# Patient Record
Sex: Male | Born: 1935 | Race: White | Hispanic: No | Marital: Married | Smoking: Former smoker
Health system: Southern US, Community
[De-identification: ages and names within clinical notes are randomized; demographics above are authoritative.]

## PROBLEM LIST (undated history)

## (undated) DIAGNOSIS — I82401 Acute embolism and thrombosis of unspecified deep veins of right lower extremity: Secondary | ICD-10-CM

## (undated) DIAGNOSIS — I5189 Other ill-defined heart diseases: Secondary | ICD-10-CM

## (undated) DIAGNOSIS — I2699 Other pulmonary embolism without acute cor pulmonale: Secondary | ICD-10-CM

## (undated) DIAGNOSIS — J449 Chronic obstructive pulmonary disease, unspecified: Secondary | ICD-10-CM

## (undated) DIAGNOSIS — I471 Supraventricular tachycardia, unspecified: Secondary | ICD-10-CM

## (undated) DIAGNOSIS — K219 Gastro-esophageal reflux disease without esophagitis: Secondary | ICD-10-CM

## (undated) DIAGNOSIS — U071 COVID-19: Secondary | ICD-10-CM

## (undated) HISTORY — DX: Other ill-defined heart diseases: I51.89

## (undated) HISTORY — DX: Other pulmonary embolism without acute cor pulmonale: I26.99

## (undated) HISTORY — DX: Acute embolism and thrombosis of unspecified deep veins of right lower extremity: I82.401

## (undated) HISTORY — PX: EYE SURGERY: SHX253

## (undated) HISTORY — PX: APPENDECTOMY: SHX54

## (undated) HISTORY — PX: CHOLECYSTECTOMY: SHX55

---

## 2004-07-25 ENCOUNTER — Ambulatory Visit (HOSPITAL_COMMUNITY): Admission: RE | Admit: 2004-07-25 | Discharge: 2004-07-25 | Payer: Self-pay | Admitting: Otolaryngology

## 2004-07-25 ENCOUNTER — Ambulatory Visit (HOSPITAL_BASED_OUTPATIENT_CLINIC_OR_DEPARTMENT_OTHER): Admission: RE | Admit: 2004-07-25 | Discharge: 2004-07-25 | Payer: Self-pay | Admitting: Otolaryngology

## 2009-09-08 ENCOUNTER — Encounter (INDEPENDENT_AMBULATORY_CARE_PROVIDER_SITE_OTHER): Payer: Self-pay | Admitting: *Deleted

## 2013-11-03 ENCOUNTER — Encounter: Payer: Self-pay | Admitting: Internal Medicine

## 2014-04-10 ENCOUNTER — Encounter: Payer: Self-pay | Admitting: Internal Medicine

## 2021-09-13 ENCOUNTER — Ambulatory Visit: Payer: Medicare Other | Admitting: Dermatology

## 2021-09-13 ENCOUNTER — Other Ambulatory Visit: Payer: Self-pay

## 2021-09-13 DIAGNOSIS — L821 Other seborrheic keratosis: Secondary | ICD-10-CM

## 2021-09-13 DIAGNOSIS — L720 Epidermal cyst: Secondary | ICD-10-CM

## 2021-09-13 DIAGNOSIS — L578 Other skin changes due to chronic exposure to nonionizing radiation: Secondary | ICD-10-CM | POA: Diagnosis not present

## 2021-09-13 DIAGNOSIS — L820 Inflamed seborrheic keratosis: Secondary | ICD-10-CM

## 2021-09-13 NOTE — Patient Instructions (Addendum)
If you have any questions or concerns for your doctor, please call our main line at [phone] and press option 4 to reach your doctor's medical assistant. If no one answers, please leave a voicemail as directed and we will return your call as soon as possible. Messages left after 4 pm will be answered the following business day.   You may also send us a message via MyChart. We typically respond to MyChart messages within 1-2 business days.  For prescription refills, please ask your pharmacy to contact our office. Our fax number is [phone].  If you have an urgent issue when the clinic is closed that cannot wait until the next business day, you can page your doctor at the number below.    Please note that while we do our best to be available for urgent issues outside of office hours, we are not available 24/7.   If you have an urgent issue and are unable to reach us, you may choose to seek medical care at your doctor's office, retail clinic, urgent care center, or emergency room.  If you have a medical emergency, please immediately call 911 or go to the emergency department.  Pager Numbers  - Dr. Kowalski: [phone]  - Dr. Moye: [phone]  - Dr. Stewart: [phone]  In the event of inclement weather, please call our main line at [phone] for an update on the status of any delays or closures.  Dermatology Medication Tips: Please keep the boxes that topical medications come in in order to help keep track of the instructions about where and how to use these. Pharmacies typically print the medication instructions only on the boxes and not directly on the medication tubes.   If your medication is too expensive, please contact our office at [phone] option 4 or send us a message through MyChart.   We are unable to tell what your co-pay for medications will be in advance as this is different depending on your insurance coverage. However, we may be able to find a substitute  medication at lower cost or fill out paperwork to get insurance to cover a needed medication.   If a prior authorization is required to get your medication covered by your insurance company, please allow us 1-2 business days to complete this process.  Drug prices often vary depending on where the prescription is filled and some pharmacies may offer cheaper prices.  The website www.goodrx.com contains coupons for medications through different pharmacies. The prices here do not account for what the cost may be with help from insurance (it may be cheaper with your insurance), but the website can give you the price if you did not use any insurance.  - You can print the associated coupon and take it with your prescription to the pharmacy.  - You may also stop by our office during regular business hours and pick up a GoodRx coupon card.  - If you need your prescription sent electronically to a different pharmacy, notify our office through [facility] MyChart or by phone at [phone] option 4.   Wound Care Instructions  Cleanse wound gently with soap and water once a day then pat dry with clean gauze. Apply a thing coat of Petrolatum (petroleum jelly, "Vaseline") over the wound (unless you have an allergy to this). We recommend that you use a new, sterile tube of Vaseline. Do not pick or remove scabs. Do not remove the yellow or white "healing tissue" from the base of the wound.  Cover the   wound with fresh, clean, nonstick gauze and secure with paper tape. You may use Band-Aids in place of gauze and tape if the would is small enough, but would recommend trimming much of the tape off as there is often too much. Sometimes Band-Aids can irritate the skin.  You should call the office for your biopsy report after 1 week if you have not already been contacted.  If you experience any problems, such as abnormal amounts of bleeding, swelling, significant bruising, significant pain, or evidence of infection,  please call the office immediately.  FOR ADULT SURGERY PATIENTS: If you need something for pain relief you may take 1 extra strength Tylenol (acetaminophen) AND 2 Ibuprofen (200mg each) together every 4 hours as needed for pain. (do not take these if you are allergic to them or if you have a reason you should not take them.) Typically, you may only need pain medication for 1 to 3 days.    

## 2021-09-13 NOTE — Progress Notes (Signed)
New Patient Visit  Subjective  Dylan Weber is a 87 y.o. male who presents for the following: check spots (Scalp, face, ~81yr, no hx of skin ca).  The following portions of the chart were reviewed this encounter and updated as appropriate:   Allergies  Meds  Problems  Med Hx  Surg Hx  Fam Hx     Review of Systems:  No other skin or systemic complaints except as noted in HPI or Assessment and Plan.  Objective  Well appearing patient in no apparent distress; mood and affect are within normal limits.  A focused examination was performed including face, scalp. Relevant physical exam findings are noted in the Assessment and Plan.  R temple x 1, R forearm x 2, R crown scalp x 1, R neck x 2, Total = 6 (6) Erythematous keratotic or waxy stuck-on papule or plaque.   L temple Cystic pap  L lat canthus area White pap   Assessment & Plan   Seborrheic Keratoses - Stuck-on, waxy, tan-brown papules and/or plaques  - Benign-appearing - Discussed benign etiology and prognosis. - Observe - Call for any changes  Inflamed seborrheic keratosis R temple x 1, R forearm x 2, R crown scalp x 1, R neck x 2, Total = 6  Destruction of lesion - R temple x 1, R forearm x 2, R crown scalp x 1, R neck x 2, Total = 6 Complexity: simple   Destruction method: cryotherapy   Informed consent: discussed and consent obtained   Timeout:  patient name, date of birth, surgical site, and procedure verified Lesion destroyed using liquid nitrogen: Yes   Region frozen until ice ball extended beyond lesion: Yes   Outcome: patient tolerated procedure well with no complications   Post-procedure details: wound care instructions given    Epidermal cyst L temple Skin excision - L temple  Lesion length (cm):  0.6 Lesion width (cm):  0.6 Margin per side (cm):  0 Total excision diameter (cm):  0.6 Informed consent: discussed and consent obtained   Timeout: patient name, date of birth, surgical site, and  procedure verified   Procedure prep:  Patient was prepped and draped in usual sterile fashion Prep type:  Isopropyl alcohol and povidone-iodine Anesthesia: the lesion was anesthetized in a standard fashion   Anesthetic:  1% lidocaine w/ epinephrine 1-100,000 buffered w/ 8.4% NaHCO3 Instrument used: #11 blade   Hemostasis achieved with: pressure and aluminum chloride   Hemostasis achieved with comment:  Electrocautery Outcome: patient tolerated procedure well with no complications   Post-procedure details: sterile dressing applied and wound care instructions given   Dressing type: bandage, pressure dressing and bacitracin (Mupirocin)    Milia L lat canthus area Benign Chronic and persistent Start Aklief cr qhs sample x 1 Lot [id number] exp 01/25  Actinic Damage - chronic, secondary to cumulative UV radiation exposure/sun exposure over time - diffuse scaly erythematous macules with underlying dyspigmentation - Recommend daily broad spectrum sunscreen SPF 30+ to sun-exposed areas, reapply every 2 hours as needed.  - Recommend staying in the shade or wearing long sleeves, sun glasses (UVA+UVB protection) and wide brim hats (4-inch brim around the entire circumference of the hat). - Call for new or changing lesions.  Return if symptoms worsen or fail to improve.  I, Othelia Pulling, RMA, am acting as scribe for Sarina Ser, MD . Documentation: I have reviewed the above documentation for accuracy and completeness, and I agree with the above.  Sarina Ser, MD

## 2021-09-14 ENCOUNTER — Encounter: Payer: Self-pay | Admitting: Dermatology

## 2021-10-12 DIAGNOSIS — U071 COVID-19: Secondary | ICD-10-CM

## 2021-10-12 HISTORY — DX: COVID-19: U07.1

## 2021-10-31 ENCOUNTER — Observation Stay
Admission: EM | Admit: 2021-10-31 | Discharge: 2021-11-01 | Disposition: A | Discharge status: Home / Self Care | Payer: Medicare Other | Attending: Internal Medicine | Admitting: Internal Medicine

## 2021-10-31 ENCOUNTER — Encounter: Payer: Self-pay | Admitting: Emergency Medicine

## 2021-10-31 ENCOUNTER — Emergency Department: Payer: Medicare Other

## 2021-10-31 ENCOUNTER — Other Ambulatory Visit: Payer: Self-pay

## 2021-10-31 DIAGNOSIS — J9601 Acute respiratory failure with hypoxia: Secondary | ICD-10-CM | POA: Insufficient documentation

## 2021-10-31 DIAGNOSIS — R42 Dizziness and giddiness: Secondary | ICD-10-CM | POA: Diagnosis present

## 2021-10-31 DIAGNOSIS — I471 Supraventricular tachycardia: Secondary | ICD-10-CM | POA: Insufficient documentation

## 2021-10-31 DIAGNOSIS — J441 Chronic obstructive pulmonary disease with (acute) exacerbation: Secondary | ICD-10-CM | POA: Diagnosis not present

## 2021-10-31 DIAGNOSIS — R0902 Hypoxemia: Secondary | ICD-10-CM | POA: Diagnosis present

## 2021-10-31 DIAGNOSIS — U071 COVID-19: Principal | ICD-10-CM

## 2021-10-31 DIAGNOSIS — K219 Gastro-esophageal reflux disease without esophagitis: Secondary | ICD-10-CM | POA: Diagnosis not present

## 2021-10-31 LAB — CBC WITH DIFFERENTIAL/PLATELET
Abs Immature Granulocytes: 0.96 10*3/uL — ABNORMAL HIGH (ref 0.00–0.07)
Basophils Absolute: 0.1 10*3/uL (ref 0.0–0.1)
Basophils Relative: 1 %
Eosinophils Absolute: 0 10*3/uL (ref 0.0–0.5)
Eosinophils Relative: 0 %
HCT: 45.9 % (ref 39.0–52.0)
Hemoglobin: 15.4 g/dL (ref 13.0–17.0)
Immature Granulocytes: 10 %
Lymphocytes Relative: 21 %
Lymphs Abs: 2 10*3/uL (ref 0.7–4.0)
MCH: 31.2 pg (ref 26.0–34.0)
MCHC: 33.6 g/dL (ref 30.0–36.0)
MCV: 92.9 fL (ref 80.0–100.0)
Monocytes Absolute: 1.7 10*3/uL — ABNORMAL HIGH (ref 0.1–1.0)
Monocytes Relative: 18 %
Neutro Abs: 4.8 10*3/uL (ref 1.7–7.7)
Neutrophils Relative %: 50 %
Platelets: 207 10*3/uL (ref 150–400)
RBC: 4.94 MIL/uL (ref 4.22–5.81)
RDW: 13.8 % (ref 11.5–15.5)
Smear Review: NORMAL
WBC: 9.7 10*3/uL (ref 4.0–10.5)
nRBC: 0 % (ref 0.0–0.2)

## 2021-10-31 LAB — TROPONIN I (HIGH SENSITIVITY)
Troponin I (High Sensitivity): 10 ng/L (ref <18)
Troponin I (High Sensitivity): 10 ng/L (ref <18)

## 2021-10-31 LAB — COMPREHENSIVE METABOLIC PANEL
ALT: 20 U/L (ref 0–44)
AST: 25 U/L (ref 15–41)
Albumin: 4 g/dL (ref 3.5–5.0)
Alkaline Phosphatase: 55 U/L (ref 38–126)
Anion gap: 7 (ref 5–15)
BUN: 24 mg/dL — ABNORMAL HIGH (ref 8–23)
CO2: 28 mmol/L (ref 22–32)
Calcium: 8.9 mg/dL (ref 8.9–10.3)
Chloride: 100 mmol/L (ref 98–111)
Creatinine, Ser: 0.97 mg/dL (ref 0.61–1.24)
GFR, Estimated: >60 mL/min (ref >60)
Glucose, Bld: 109 mg/dL — ABNORMAL HIGH (ref 70–99)
Potassium: 3.9 mmol/L (ref 3.5–5.1)
Sodium: 135 mmol/L (ref 135–145)
Total Bilirubin: 0.6 mg/dL (ref 0.3–1.2)
Total Protein: 7.6 g/dL (ref 6.5–8.1)

## 2021-10-31 LAB — RESP PANEL BY RT-PCR (FLU A&B, COVID) ARPGX2
Influenza A by PCR: NEGATIVE
Influenza B by PCR: NEGATIVE
SARS Coronavirus 2 by RT PCR: POSITIVE — AB

## 2021-10-31 LAB — D-DIMER, QUANTITATIVE: D-Dimer, Quant: 0.46 ug/mL-FEU (ref 0.00–0.50)

## 2021-10-31 MED ORDER — ASCORBIC ACID 500 MG PO TABS
500.0000 mg | ORAL_TABLET | Freq: Every day | ORAL | Status: DC
  Administered 2021-10-31 – 2021-11-01 (×2): 500 mg via ORAL
  Filled 2021-10-31 (×2): qty 1

## 2021-10-31 MED ORDER — PANTOPRAZOLE SODIUM 40 MG PO TBEC
40.0000 mg | DELAYED_RELEASE_TABLET | Freq: Every day | ORAL | Status: DC
  Administered 2021-11-01: 10:00:00 40 mg via ORAL
  Filled 2021-10-31: qty 1

## 2021-10-31 MED ORDER — IPRATROPIUM-ALBUTEROL 20-100 MCG/ACT IN AERS
1.0000 | INHALATION_SPRAY | Freq: Four times a day (QID) | RESPIRATORY_TRACT | Status: DC
  Administered 2021-11-01 (×3): 1 via RESPIRATORY_TRACT
  Filled 2021-10-31: qty 4

## 2021-10-31 MED ORDER — HYDROCOD POLST-CPM POLST ER 10-8 MG/5ML PO SUER: 5.0000 mL | Freq: Two times a day (BID) | ORAL | Status: DC | PRN

## 2021-10-31 MED ORDER — ZINC SULFATE 220 (50 ZN) MG PO CAPS
220.0000 mg | ORAL_CAPSULE | Freq: Every day | ORAL | Status: DC
  Administered 2021-10-31 – 2021-11-01 (×2): 220 mg via ORAL
  Filled 2021-10-31 (×2): qty 1

## 2021-10-31 MED ORDER — FLUTICASONE FUROATE-VILANTEROL 200-25 MCG/ACT IN AEPB
1.0000 | INHALATION_SPRAY | Freq: Every day | RESPIRATORY_TRACT | Status: DC
  Administered 2021-11-01: 12:00:00 1 via RESPIRATORY_TRACT
  Filled 2021-10-31 (×2): qty 28

## 2021-10-31 MED ORDER — ADULT MULTIVITAMIN W/MINERALS CH
1.0000 | ORAL_TABLET | Freq: Every day | ORAL | Status: DC
  Administered 2021-11-01: 10:00:00 1 via ORAL
  Filled 2021-10-31: qty 1

## 2021-10-31 MED ORDER — GUAIFENESIN-DM 100-10 MG/5ML PO SYRP: 10.0000 mL | ORAL_SOLUTION | ORAL | Status: DC | PRN

## 2021-10-31 MED ORDER — SODIUM CHLORIDE 0.9 % IV BOLUS
1000.0000 mL | Freq: Once | INTRAVENOUS | Status: AC
  Administered 2021-10-31: 17:00:00 1000 mL via INTRAVENOUS

## 2021-10-31 MED ORDER — ENOXAPARIN SODIUM 40 MG/0.4ML IJ SOSY
40.0000 mg | PREFILLED_SYRINGE | INTRAMUSCULAR | Status: DC
  Administered 2021-11-01: 40 mg via SUBCUTANEOUS
  Filled 2021-10-31: qty 0.4

## 2021-10-31 MED ORDER — METOPROLOL TARTRATE 5 MG/5ML IV SOLN: 5.0000 mg | INTRAVENOUS | Status: DC | PRN

## 2021-10-31 NOTE — ED Triage Notes (Signed)
Pt brought in by POV with c/o feeling like he was going to pass out. Pts O2 on room air was 84% and was placed on 3L . He does not usually wear O2.

## 2021-10-31 NOTE — H&P (Signed)
H&P:    Dylan Weber   AUQ:[id number] DOB: 25-Sep-1933 DOA: 10/31/2021  PCP: [facility]  Chief Complaint: Generalized weakness, fatigue and several episodes of near syncope   History of Present Illness:    HPI: Dylan Weber is a 87 y.o. male with a past medical history of COPD not on chronic oxygen supplementation, GERD, former tobacco dependence.  This patient presents to the emergency department with generalized weakness, fatigue and several episodes of near syncope over the last 48 hours.  Was diagnosed with COVID-19 on December 13.  Completed 5-day course of Paxlovid and prednisone.  He endorses an occasional cough with purulent sputum but this is now essentially resolved.  No fevers or chills.  No urinary complaints.  No nausea vomiting or diarrhea.  No abdominal pain.  No wheezing  In the emergency department he is found to be hypoxic with 85% oxygen saturation on room air.  Currently on 4 L and saturating well.  Not on chronic oxygen.  Labs are fairly unremarkable.  He did have 1 episode of A. fib RVR with his near syncope episode that self resolved.  Heart rate was 141.  Wife is present at bedside.  He is not encephalopathic.  He denies any calf tenderness bilaterally.  He has received both COVID-19 vaccines and 3 booster vaccines.  Last booster shot was 3 weeks ago.  Has also received a flu vaccine this year.  ED Course: Chest x-ray showed no acute findings.  Troponins are unremarkable.  Labs are unremarkable as well.     ROS:   14 point review of systems is negative except for what is mentioned above in the HPI.   Past Medical History:   History reviewed. No pertinent past medical history.  Past Surgical History:   History reviewed. No pertinent surgical history.  Social History:   Social History   Socioeconomic History   Marital status: Married    Spouse name: Not on file   Number of children: Not on file   Years of education: Not on file    Highest education level: Not on file  Occupational History   Not on file  Tobacco Use   Smoking status: Not on file   Smokeless tobacco: Not on file  Substance and Sexual Activity   Alcohol use: Not on file   Drug use: Not on file   Sexual activity: Not on file  Other Topics Concern   Not on file  Social History Narrative   Not on file   Social Determinants of Health   Financial Resource Strain: Not on file  Food Insecurity: Not on file  Transportation Needs: Not on file  Physical Activity: Not on file  Stress: Not on file  Social Connections: Not on file  Intimate Partner Violence: Not on file    Allergies:   No Known Allergies  Family History:   No family history on file.   Current Medications:   Prior to Admission medications   Medication Sig Start Date End Date Taking? Authorizing Provider  acetaminophen (TYLENOL 8 HOUR ARTHRITIS PAIN) 650 MG CR tablet Take 650 mg by mouth as needed for pain.   Yes [provider]  BREO ELLIPTA 200-25 MCG/ACT AEPB Inhale 1 puff into the lungs daily. 10/12/21  Yes [provider]  guaiFENesin (MUCINEX) 600 MG 12 hr tablet Take 600 mg by mouth daily.   Yes [provider]  Multiple Vitamin (MULTIVITAMIN) capsule Take 1 capsule by mouth daily.  Yes [provider]  pantoprazole (PROTONIX) 40 MG tablet Take 40 mg by mouth daily. 09/20/21  Yes [provider]  pseudoephedrine-acetaminophen (TYLENOL SINUS) 30-500 MG TABS tablet Take 1 tablet by mouth as needed.   Yes [provider]     Physical Exam:   Vitals:   10/31/21 1544 10/31/21 1630 10/31/21 1700 10/31/21 1715  BP:  (!) 135/120 115/68   Pulse:   81 (!) 114  Resp:    17  Temp:      TempSrc:      SpO2:   (!) 87% (!) 85%  Weight: 78.5 kg     Height: 5\' 9"  (1.753 m)        General:  Appears calm and comfortable and is in NAD Cardiovascular:  RRR, no m/r/g.  Respiratory:   CTA bilaterally with no  wheezes/rales/rhonchi.  Normal respiratory effort. Abdomen:  soft, NT, ND, NABS Skin:  no rash or induration seen on limited exam Musculoskeletal:  grossly normal tone BUE/BLE, good ROM, no bony abnormality Lower extremity:  No LE edema.  Limited foot exam with no ulcerations.  2+ distal pulses. Psychiatric:  grossly normal mood and affect, speech fluent and appropriate, AOx3 Neurologic:  CN 2-12 grossly intact, moves all extremities in coordinated fashion, sensation intact    Data Review:    Radiological Exams on Admission: Independently reviewed - see discussion in A/P where applicable  DG Chest 2 View  Result Date: 10/31/2021 CLINICAL DATA:  Syncope EXAM: CHEST - 2 VIEW COMPARISON:  None. FINDINGS: The heart size and mediastinal contours are within normal limits. Shallow inspiration with low lung volumes. Both lungs are clear. No pleural effusion or pneumothorax. The visualized skeletal structures are unremarkable. IMPRESSION: No active cardiopulmonary disease. Electronically Signed   By: Macy Mis M.D.   On: 10/31/2021 16:18    EKG: Independently reviewed.  A. fib RVR, rate 141, repeat EKG sinus rhythm with rate 96 with occasional PVCs.   Labs on Admission: I have personally reviewed the available labs and imaging studies at the time of the admission.  Pertinent labs on Admission: BUN 24     Assessment/Plan:    Acute hypoxic respiratory failure: Secondary to COVID-19 infection.  No evidence of superimposed bacterial pneumonia on chest x-ray.  Will obtain D-dimer and if elevated obtain CTA to rule out pulmonary embolism.  He completed a 5-day course of Paxlovid and prednisone.  Supportive care with antitussives.  Flutter valve and ICS.  Start scheduled Combivent inhaler.  Wean oxygen as tolerated.  Eager to go home.  Consider home oxygen screening in the morning and discharge with home oxygen supplementation for recovery at home.  Contact and airborne  precautions.  Paroxysmal atrial fibrillation: Rapid ventricular response.  Self resolved.  Transient in the context of COVID-19 infection.  Consult cardiology.  Defer to cardiology for starting chronic anticoagulation.  Obtain echocardiogram.  Lopressor IV as needed for heart rate greater than 130. Continue telemetry.  COPD: Continue home Breo Ellipta inhaler.  Not in acute exacerbation.  Clear lungs on exam.  GERD: Continue home Protonix    Other information:    Level of Care: Med telemetry DVT prophylaxis: Lovenox subcu Code Status: Full code Consults: Cardiology Admission status: Inpatient   Leslee Home DO [facility]   How to contact the [facility] Attending or Consulting provider [place] or covering provider during after hours [place], for this patient?  Check the care team in [facility] and look for a)  attending/consulting [facility] provider listed and b) the [facility] team listed Log into [email or web address] and use [place]'s universal password to access. If you do not have the password, please contact the hospital operator. Locate the [facility] provider you are looking for under [facility] and page to a number that you can be directly reached. If you still have difficulty reaching the provider, please page the [facility] (Director on Call) for the Hospitalists listed on amion for assistance.   10/31/2021, 6:25 PM

## 2021-10-31 NOTE — ED Provider Notes (Signed)
°  Emergency Medicine Provider Triage Evaluation Note  Dylan Weber , a 87 y.o.male,  was evaluated in triage.  Pt complains of near syncope.  Patient states that he has been experiencing several episodes of near fainting over the past 48 hours.  Patient states that he feels a warm rush to his body and starts to feel his vision darkening.  These episodes self resolved.  During our initial EKG, he began experiencing these episodes.  EKG shows A. fib with RVR with a rate of 147.  The following EKG after the episode shows sinus rhythm with a rate of 96.  Denies chest pain, shortness of breath, back pain, or urinary symptoms.   Review of Systems  Positive: Near syncope Negative: Denies fever, chest pain, vomiting  Physical Exam   Vitals:   10/31/21 1530  BP: 104/89  Pulse: 80  Resp: 20  Temp: 98 F (36.7 C)  SpO2: 91%   Gen:   Awake, no distress   Resp:  Normal effort  MSK:   Moves extremities without difficulty  Other:    Medical Decision Making  Given the patient's initial medical screening exam, the following diagnostic evaluation has been ordered. The patient will be placed in the appropriate treatment space, once one is available, to complete the evaluation and treatment. I have discussed the plan of care with the patient and I have advised the patient that an ED physician or mid-level practitioner will reevaluate their condition after the test results have been received, as the results may give them additional insight into the type of treatment they may need.    Diagnostics: Labs, EKG, CXR  Treatments: none immediately   Teodoro Spray, PA 10/31/21 1544    Lucrezia Starch, MD 10/31/21 2049

## 2021-10-31 NOTE — ED Provider Notes (Signed)
[facility] Emergency Department Provider Note  Time seen: 4:18 PM  I have reviewed the triage vital signs and the nursing notes.   HISTORY  Chief Complaint Near Syncope   HPI Dylan Weber is a 87 y.o. male who was diagnosed with COVID last week presents to the emergency department for several episodes of near syncope.  According to the patient over the past 48 hours he has been feeling very lightheaded at times and has had several episodes of almost passing out but states he has not actually passed out.  Here patient is awake alert, no distress.  Denies any shortness of breath however currently satting 84% on room air placed on 3 L nasal cannula.  No home/baseline O2 requirement.  Patient was diagnosed with COVID last week finished his antivirals on Monday.   History reviewed. No pertinent past medical history.  There are no problems to display for this patient.   History reviewed. No pertinent surgical history.  Prior to Admission medications   Not on File    No Known Allergies  No family history on file.  Social History    Review of Systems Constitutional: Negative for fever. Cardiovascular: Negative for chest pain. Respiratory: Intermittent shortness of breath none currently. Gastrointestinal: Negative for abdominal pain Musculoskeletal: Negative for musculoskeletal complaints Neurological: Negative for headache All other ROS negative  ____________________________________________   PHYSICAL EXAM:  VITAL SIGNS: ED Triage Vitals  Enc Vitals Group     BP 10/31/21 1530 104/89     Pulse Rate 10/31/21 1530 80     Resp 10/31/21 1530 20     Temp 10/31/21 1530 98 F (36.7 C)     Temp Source 10/31/21 1530 Oral     SpO2 10/31/21 1530 91 %     Weight 10/31/21 1544 173 lb (78.5 kg)     Height 10/31/21 1544 5\' 9"  (1.753 m)     Head Circumference --      Peak Flow --      Pain Score 10/31/21 1544 0     Pain Loc --      Pain Edu? --       Excl. in [place]? --    Constitutional: Alert and oriented. Well appearing and in no distress. Eyes: Normal exam ENT      Head: Normocephalic and atraumatic.      Mouth/Throat: Mucous membranes are moist. Cardiovascular: Normal rate, regular rhythm.  Respiratory: Normal respiratory effort without tachypnea nor retractions. Breath sounds are clear, no wheeze rales or rhonchi. Gastrointestinal: Soft and nontender. No distention.   Musculoskeletal: Nontender with normal range of motion in all extremities.  Neurologic:  Normal speech and language. No gross focal neurologic deficits Skin:  Skin is warm, dry and intact.  Psychiatric: Mood and affect are normal.  ____________________________________________    EKG  EKG viewed and interpreted by myself shows a normal sinus rhythm at 96 bpm with a narrow QRS, normal axis, normal intervals, nonspecific ST changes.  ____________________________________________    RADIOLOGY  Chest x-ray is negative  ____________________________________________   INITIAL IMPRESSION / ASSESSMENT AND PLAN / ED COURSE  Pertinent labs & imaging results that were available during my care of the patient were reviewed by me and considered in my medical decision making (see chart for details).   Patient presents emergency department for near syncope and generalized weakness at home.  Per patient was diagnosed with COVID last week, finishes antivirals.  Patient presents with weakness/lightheadedness currently satting 84% on room  air with no baseline O2 requirement.  We will check labs, chest x-ray, troponin and continue to closely monitor.  Patient is on 3 L satting in the mid upper 90s.  Lab work is largely nonrevealing including a normal troponin.  Patient has dropped his saturation into the 80s on 2 L currently on 4 L of oxygen.  Highly suspect COVID-related hypoxia.  We will admit to the hospital service for ongoing supportive care and oxygen.  Patient agreeable to  plan of care.   Dylan Weber was evaluated in Emergency Department on 10/31/2021 for the symptoms described in the history of present illness. He was evaluated in the context of the global COVID-19 pandemic, which necessitated consideration that the patient might be at risk for infection with the SARS-CoV-2 virus that causes COVID-19. Institutional protocols and algorithms that pertain to the evaluation of patients at risk for COVID-19 are in a state of rapid change based on information released by regulatory bodies including the CDC and federal and state organizations. These policies and algorithms were followed during the patient's care in the ED.  CRITICAL CARE Performed by: Harvest Dark   Total critical care time: 30 minutes  Critical care time was exclusive of separately billable procedures and treating other patients.  Critical care was necessary to treat or prevent imminent or life-threatening deterioration.  Critical care was time spent personally by me on the following activities: development of treatment plan with patient and/or surrogate as well as nursing, discussions with consultants, evaluation of patient's response to treatment, examination of patient, obtaining history from patient or surrogate, ordering and performing treatments and interventions, ordering and review of laboratory studies, ordering and review of radiographic studies, pulse oximetry and re-evaluation of patient's condition.  ____________________________________________   FINAL CLINICAL IMPRESSION(S) / ED DIAGNOSES  Respiratory failure secondary to COVID-19   Harvest Dark, MD 10/31/21 1754

## 2021-11-01 ENCOUNTER — Telehealth: Payer: Self-pay | Admitting: Cardiovascular Disease

## 2021-11-01 ENCOUNTER — Inpatient Hospital Stay: Admit: 2021-11-01 | Payer: Medicare Other

## 2021-11-01 ENCOUNTER — Other Ambulatory Visit: Payer: Self-pay | Admitting: Nurse Practitioner

## 2021-11-01 DIAGNOSIS — R55 Syncope and collapse: Secondary | ICD-10-CM

## 2021-11-01 DIAGNOSIS — U071 COVID-19: Secondary | ICD-10-CM | POA: Diagnosis not present

## 2021-11-01 DIAGNOSIS — I471 Supraventricular tachycardia: Secondary | ICD-10-CM | POA: Diagnosis not present

## 2021-11-01 DIAGNOSIS — J441 Chronic obstructive pulmonary disease with (acute) exacerbation: Secondary | ICD-10-CM | POA: Insufficient documentation

## 2021-11-01 DIAGNOSIS — K219 Gastro-esophageal reflux disease without esophagitis: Secondary | ICD-10-CM | POA: Insufficient documentation

## 2021-11-01 DIAGNOSIS — J449 Chronic obstructive pulmonary disease, unspecified: Secondary | ICD-10-CM | POA: Insufficient documentation

## 2021-11-01 DIAGNOSIS — J9621 Acute and chronic respiratory failure with hypoxia: Secondary | ICD-10-CM

## 2021-11-01 DIAGNOSIS — J9601 Acute respiratory failure with hypoxia: Secondary | ICD-10-CM

## 2021-11-01 DIAGNOSIS — R0902 Hypoxemia: Secondary | ICD-10-CM | POA: Diagnosis present

## 2021-11-01 LAB — BLOOD GAS, ARTERIAL
Acid-Base Excess: 1.8 mmol/L (ref 0.0–2.0)
Bicarbonate: 24.4 mmol/L (ref 20.0–28.0)
FIO2: 0.21
O2 Saturation: 91.2 %
Patient temperature: 37
pCO2 arterial: 32 mmHg (ref 32.0–48.0)
pH, Arterial: 7.49 — ABNORMAL HIGH (ref 7.350–7.450)
pO2, Arterial: 56 mmHg — ABNORMAL LOW (ref 83.0–108.0)

## 2021-11-01 LAB — TSH: TSH: 1.028 u[IU]/mL (ref 0.350–4.500)

## 2021-11-01 MED ORDER — ZINC SULFATE 220 (50 ZN) MG PO CAPS: 220.0000 mg | ORAL_CAPSULE | Freq: Every day | ORAL | 0 refills | Status: DC

## 2021-11-01 MED ORDER — DILTIAZEM HCL ER COATED BEADS 120 MG PO CP24: 120.0000 mg | ORAL_CAPSULE | Freq: Every day | ORAL | 6 refills | Status: DC

## 2021-11-01 MED ORDER — DILTIAZEM HCL ER COATED BEADS 120 MG PO CP24
120.0000 mg | ORAL_CAPSULE | Freq: Every day | ORAL | Status: DC
  Administered 2021-11-01: 13:00:00 120 mg via ORAL
  Filled 2021-11-01: qty 1

## 2021-11-01 MED ORDER — PREDNISONE 10 MG PO TABS: ORAL_TABLET | ORAL | 0 refills | Status: DC

## 2021-11-01 MED ORDER — DIPHENHYDRAMINE HCL 25 MG PO CAPS
50.0000 mg | ORAL_CAPSULE | Freq: Once | ORAL | Status: AC
Start: 2021-11-01 — End: 2021-11-01
  Administered 2021-11-01: 50 mg via ORAL
  Filled 2021-11-01: qty 2

## 2021-11-01 MED ORDER — ASCORBIC ACID 500 MG PO TABS: 500.0000 mg | ORAL_TABLET | Freq: Every day | ORAL | 0 refills | Status: DC

## 2021-11-01 MED ORDER — ACETAMINOPHEN 325 MG PO TABS
650.0000 mg | ORAL_TABLET | Freq: Once | ORAL | Status: AC
  Administered 2021-11-01: 06:00:00 650 mg via ORAL
  Filled 2021-11-01: qty 2

## 2021-11-01 MED ORDER — METHYLPREDNISOLONE SODIUM SUCC 125 MG IJ SOLR
80.0000 mg | INTRAMUSCULAR | Status: DC
  Administered 2021-11-01: 08:00:00 80 mg via INTRAVENOUS
  Filled 2021-11-01: qty 2

## 2021-11-01 MED ORDER — SODIUM CHLORIDE 0.9 % IV SOLN
100.0000 mg | Freq: Every day | INTRAVENOUS | Status: DC
  Filled 2021-11-01: qty 20

## 2021-11-01 MED ORDER — SODIUM CHLORIDE 0.9 % IV SOLN
200.0000 mg | Freq: Once | INTRAVENOUS | Status: AC
  Administered 2021-11-01: 11:00:00 200 mg via INTRAVENOUS
  Filled 2021-11-01: qty 40

## 2021-11-01 MED ORDER — METHYLPREDNISOLONE SODIUM SUCC 40 MG IJ SOLR: 40.0000 mg | Freq: Two times a day (BID) | INTRAMUSCULAR | Status: DC

## 2021-11-01 MED ORDER — ALBUTEROL SULFATE HFA 108 (90 BASE) MCG/ACT IN AERS: 2.0000 | INHALATION_SPRAY | Freq: Four times a day (QID) | RESPIRATORY_TRACT | 0 refills | Status: DC | PRN

## 2021-11-01 NOTE — Progress Notes (Signed)
PHARMACIST - PHYSICIAN COMMUNICATION   CONCERNING: Methylprednisolone IV    Current order: Methylprednisolone IV 40mg  IV every 12 hours      DESCRIPTION: Per protocol [facility] Protocol:   IV methylprednisolone will be converted to either a q12h or q24h frequency with the same total daily dose (TDD).  Ordered Dose: 1 to 125 mg TDD; convert to: TDD q24h.  Ordered Dose: 126 to 250 mg TDD; convert to: TDD div q12h.  Ordered Dose: >250 mg TDD; DAW.  Order has been adjusted to: Methylprednisolone IV 80mg  IV every 24 hours   Pernell Dupre , PharmD, BCPS Clinical Pharmacist  11/01/2021 8:22 AM

## 2021-11-01 NOTE — Progress Notes (Signed)
SATURATION QUALIFICATIONS: (This note is used to comply with regulatory documentation for home oxygen)  Patient Saturations on Room Air at Rest = 92%  Patient Saturations on Room Air while Ambulating = 87%  Patient Saturations on 2 Liters of oxygen while Ambulating = 88-92%  Please briefly explain why patient needs home oxygen:  Desats on room air at rest at times and with exertion.

## 2021-11-01 NOTE — ED Notes (Signed)
While pt ambulating down the hallway, pt O2 saturation on RA was staying in the upper 70s-80s. When pt was sitting on the toilet, pt O2 saturation increased to 92%. Pt in bed with O2 saturation of 91-92% on RA. Baxter Flattery, RN aware.

## 2021-11-01 NOTE — Telephone Encounter (Addendum)
-----   Message from Theora Gianotti, NP sent at 11/01/2021 12:24 PM EST -----    Would you pls arrange for this gent to f/u with me or Gollan in ~ 3-4 wks?  Also, I'm placing an order for an echo - would you pls contact him to schedule?  He's got COVID, so ideally would wait at least 2 wks.  Thanks,  Gerald Stabs

## 2021-11-01 NOTE — Telephone Encounter (Signed)
Currently admitted ED will attempt another day .

## 2021-11-01 NOTE — TOC Initial Note (Signed)
Transition of Care [facility]) - Initial/Assessment Note    Patient Details  Name: Dylan Weber MRN: [id number] Date of Birth: 07-26-1933  Transition of Care [facility]) CM/SW Contact:    Shelbie Hutching, RN Phone Number: 11/01/2021, 3:59 PM  Clinical Narrative:                 Patient admitted to the hospital with COVID then changed to observation when condition resolved and patient discharged earlier than anticipated.  MOON code 47 completed with patient at the bedside.    Patient is from home with his wife.  He is independent and drives.  He is current with PCP at [facility].  He does need oxygen at home.  Oxygen ordered from [employer] and delivered to the room before he discharged home.   Wife picked patient up and transporting home.  Expected Discharge Plan: Home/Self Care Barriers to Discharge: Barriers Resolved   Patient Goals and CMS Choice Patient states their goals for this hospitalization and ongoing recovery are:: Glad to be going home today      Expected Discharge Plan and Services Expected Discharge Plan: Home/Self Care   Discharge Planning Services: CM Consult   Living arrangements for the past 2 months: Single Family Home Expected Discharge Date: 11/01/21               DME Arranged: Oxygen DME Agency: [employer] Date DME Agency Contacted: 11/01/21 Time DME Agency Contacted: 1250 Representative spoke with at DME Agency: [place]: NA [place] Agency: NA        Prior Living Arrangements/Services Living arrangements for the past 2 months: [place] with:: Spouse Patient language and need for interpreter reviewed:: Yes Do you feel safe going back to the place where you live?: Yes      Need for Family Participation in Patient Care: Yes (Comment) Care giver support system in place?: Yes (comment)   Criminal Activity/Legal Involvement Pertinent to Current Situation/Hospitalization: No - Comment as needed  Activities of Daily Living       Permission Sought/Granted Permission sought to share information with : Case Manager, Family Supports, Other (comment) Permission granted to share information with : Yes, Verbal Permission Granted     Permission granted to share info w AGENCY: [employer]  Permission granted to share info w Relationship: spouse     Emotional Assessment Appearance:: Appears stated age Attitude/Demeanor/Rapport: Engaged Affect (typically observed): Accepting Orientation: : Oriented to Self, Oriented to Place, Oriented to  Time, Oriented to Situation Alcohol / Substance Use: Not Applicable Psych Involvement: No (comment)  Admission diagnosis:  COVID-19 [U07.1] Hypoxia [R09.02] Patient Active Problem List   Diagnosis Date Noted   Hypoxia 11/01/2021   COVID-19 10/31/2021   PCP:  [place] [facility]:   [id number] - , [place] [address] [place] [address] Phone: [phone] Fax: [phone]     Social Determinants of Health (SDOH) Interventions    Readmission Risk Interventions No flowsheet data found.

## 2021-11-01 NOTE — Care Management CC44 (Signed)
Condition Code 44 Documentation Completed  Patient Details  Name: Dylan Weber MRN: [id number] Date of Birth: Sep 19, 1933   Condition Code 44 given:  Yes Patient signature on Condition Code 44 notice:  Yes Documentation of 2 MD's agreement:  Yes Code 44 added to claim:  Yes    Shelbie Hutching, RN 11/01/2021, 12:57 PM

## 2021-11-01 NOTE — ED Notes (Signed)
Pt ambulatory approximately 20 ft around rm on 2L O2. Pt O2 saturation stayed around 88-92% on 2L O2. Pt stated that he "felt fine for my age."

## 2021-11-01 NOTE — Discharge Instructions (Addendum)
Wears 2 Liters of oxygen 24/7

## 2021-11-01 NOTE — ED Notes (Signed)
Pt was given home O2 to go home with and set and use was explained to pt and wife with teach back completed.

## 2021-11-01 NOTE — Consult Note (Signed)
Cardiology Consultation:   Patient ID: Dylan Weber MRN: [id number]; DOB: 1933/06/09  Admit date: 10/31/2021 Date of Consult: 11/01/2021  PCP:  [facility] Cardiologist:  new to [facility] Physician requesting consult: Dr. Leslye Peer Reason for consult: Arrhythmia, shortness of breath, near syncope  Patient Profile:   Dylan Weber is a 87 y.o. male with a hx of smoking, COPD who is being seen 11/01/2021 for the evaluation of hypoxia, wide-complex tachycardia, near syncope  History of Present Illness:   Dylan Weber reports he has been in his usual state of health until several days ago, Has had increasing shortness of breath, near syncope episodes Describes these episodes as a near fainting experience, warm rash over his body, " vision goes dark" Has only had several of these episodes over the past 2 days On room air in triage 84%, placed on 3 L nasal cannula with improvement of his hypoxia Reports he uses inhalers at home both albuterol and steroid inhaler  In ER emergency room noted to have tachycardia rate up to 140 bpm, self terminating.  With these episodes had associated near syncope/lightheadedness  Reports having similar episodes approximately 15 years ago but does not have episodes on frequent basis  Note indicating diagnosed with COVID last week, finished antivirals approximately 2-3 days ago Finished Paxil elevated, prednisone Reports he had completed his vaccine boosters  On ambulation the emergency room saturations 70s to 80s on room air Into the low 90s on nasal cannula oxygen    History reviewed.  COPD, prior smoking history  History reviewed. No pertinent surgical history.   Home Medications:  Prior to Admission medications   Medication Sig Start Date End Date Taking? Authorizing Provider  acetaminophen (TYLENOL 8 HOUR ARTHRITIS PAIN) 650 MG CR tablet Take 650 mg by mouth as needed for pain.   Yes [provider]   albuterol (VENTOLIN HFA) 108 (90 Base) MCG/ACT inhaler Inhale 2 puffs into the lungs every 6 (six) hours as needed for wheezing or shortness of breath. 11/01/21  Yes Wieting, Richard, MD  BREO ELLIPTA 200-25 MCG/ACT AEPB Inhale 1 puff into the lungs daily. 10/12/21  Yes [provider]  guaiFENesin (MUCINEX) 600 MG 12 hr tablet Take 600 mg by mouth daily.   Yes [provider]  Multiple Vitamin (MULTIVITAMIN) capsule Take 1 capsule by mouth daily.   Yes [provider]  pantoprazole (PROTONIX) 40 MG tablet Take 40 mg by mouth daily. 09/20/21  Yes [provider]  predniSONE (DELTASONE) 10 MG tablet 4 tabs po day1,2; 3 tabs po day3,4; 2 tabs po day5,6; 1 tab po day 7,8; 1/2 tab po day9,10 11/01/21  Yes Wieting, Richard, MD  pseudoephedrine-acetaminophen (TYLENOL SINUS) 30-500 MG TABS tablet Take 1 tablet by mouth as needed.   Yes [provider]  ascorbic acid (VITAMIN C) 500 MG tablet Take 1 tablet (500 mg total) by mouth daily. 11/02/21   Loletha Grayer, MD  diltiazem (CARDIZEM CD) 120 MG 24 hr capsule Take 1 capsule (120 mg total) by mouth daily. 11/01/21   Theora Gianotti, NP  zinc sulfate 220 (50 Zn) MG capsule Take 1 capsule (220 mg total) by mouth daily. 11/02/21   Loletha Grayer, MD    Inpatient Medications: Scheduled Meds:  vitamin C  500 mg Oral Daily   diltiazem  120 mg Oral Daily   enoxaparin (LOVENOX) injection  40 mg Subcutaneous Q24H   fluticasone furoate-vilanterol  1 puff Inhalation Daily   Ipratropium-Albuterol  1 puff Inhalation Q6H   methylPREDNISolone (SOLU-MEDROL) injection  80 mg Intravenous Q24H   multivitamin with minerals  1 tablet Oral Daily   pantoprazole  40 mg Oral Daily   zinc sulfate  220 mg Oral Daily   Continuous Infusions:  [START ON 11/02/2021] remdesivir 100 mg in NS 100 mL     PRN Meds: guaiFENesin-dextromethorphan, metoprolol tartrate  Allergies:   No Known Allergies  Social History:    Social History   Socioeconomic History   Marital status: Married    Spouse name: Not on file   Number of children: Not on file   Years of education: Not on file   Highest education level: Not on file  Occupational History   Not on file  Tobacco Use   Smoking status: Not on file   Smokeless tobacco: Not on file  Substance and Sexual Activity   Alcohol use: Not on file   Drug use: Not on file   Sexual activity: Not on file  Other Topics Concern   Not on file  Social History Narrative   Not on file   Social Determinants of Health   Financial Resource Strain: Not on file  Food Insecurity: Not on file  Transportation Needs: Not on file  Physical Activity: Not on file  Stress: Not on file  Social Connections: Not on file  Intimate Partner Violence: Not on file    Family History:   No family history on file.   ROS:  Please see the history of present illness.  Review of Systems  Constitutional: Negative.   HENT: Negative.    Respiratory: Negative.    Cardiovascular: Negative.   Gastrointestinal: Negative.   Musculoskeletal: Negative.   Neurological: Negative.   Psychiatric/Behavioral: Negative.    All other systems reviewed and are negative.   Physical Exam/Data:   Vitals:   11/01/21 0725 11/01/21 0822 11/01/21 1142 11/01/21 1248  BP: 112/90  132/89 136/89  Pulse: 84  73   Resp: 20  20   Temp: 98 F (36.7 C)     TempSrc: Oral     SpO2: 91% (!) 88% 95%   Weight:      Height:        Intake/Output Summary (Last 24 hours) at 11/01/2021 1359 Last data filed at 11/01/2021 1137 Gross per 24 hour  Intake 1290 ml  Output --  Net 1290 ml   Last 3 Weights 10/31/2021  Weight (lbs) 173 lb  Weight (kg) 78.472 kg     Body mass index is 25.55 kg/m.  General:  Well nourished, well developed, in no acute distress HEENT: normal Neck: no JVD Vascular: No carotid bruits; Distal pulses 2+ bilaterally Cardiac:  normal S1, S2; RRR; no murmur  Lungs: Wheezing,  mildly decreased breath sounds throughout, scattered Rales Abd: soft, nontender, no hepatomegaly  Ext: no edema Musculoskeletal:  No deformities, BUE and BLE strength normal and equal Skin: warm and dry  Neuro:  CNs 2-12 intact, no focal abnormalities noted Psych:  Normal affect   EKG:  The EKG was personally reviewed and demonstrates:   Supraventricular tachycardia/atrial tachycardia  Telemetry:  Telemetry was personally reviewed and demonstrates:   Normal sinus rhythm, short runs SVT/atrial tachycardia  Relevant CV Studies: Echocardiogram pending  Laboratory Data:  High Sensitivity Troponin:   Recent Labs  Lab 10/31/21 1543 10/31/21 1839  TROPONINIHS 10 10     Chemistry Recent Labs  Lab 10/31/21 1543  NA 135  K 3.9  CL 100  CO2 28  GLUCOSE 109*  BUN 24*  CREATININE 0.97  CALCIUM 8.9  GFRNONAA >60  ANIONGAP 7    Recent Labs  Lab 10/31/21 1543  PROT 7.6  ALBUMIN 4.0  AST 25  ALT 20  ALKPHOS 55  BILITOT 0.6   Lipids No results for input(s): CHOL, TRIG, HDL, LABVLDL, LDLCALC, CHOLHDL in the last 168 hours.  Hematology Recent Labs  Lab 10/31/21 1543  WBC 9.7  RBC 4.94  HGB 15.4  HCT 45.9  MCV 92.9  MCH 31.2  MCHC 33.6  RDW 13.8  PLT 207   Thyroid  Recent Labs  Lab 10/31/21 1543  TSH 1.028    BNPNo results for input(s): BNP, PROBNP in the last 168 hours.  DDimer  Recent Labs  Lab 10/31/21 1839  DDIMER 0.46     Radiology/Studies:  DG Chest 2 View  Result Date: 10/31/2021 CLINICAL DATA:  Syncope EXAM: CHEST - 2 VIEW COMPARISON:  None. FINDINGS: The heart size and mediastinal contours are within normal limits. Shallow inspiration with low lung volumes. Both lungs are clear. No pleural effusion or pneumothorax. The visualized skeletal structures are unremarkable. IMPRESSION: No active cardiopulmonary disease. Electronically Signed   By: Macy Mis M.D.   On: 10/31/2021 16:18     Assessment and Plan:   COPD exacerbation in the  setting of COVID, acute on chronic respiratory failure Takes several inhalers at home Recent viral infection presenting with hypoxia Plan is home oxygen Continued supportive care, May benefit from nebulizers  2.  Near syncope Secondary to tachyarrhythmia as detailed below Discussed atrial tachycardia/SVT Likely having hypotension in the setting of arrhythmia causing symptoms  3.  Atrial tachycardia/SVT Noted on EKG and telemetry with associated symptoms correlating to arrhythmia Recommend restart diltiazem extended release 120 mg daily Will avoid beta-blockers in the setting of bronchospasm, COPD exacerbation, COVID, hypoxia Will arrange outpatient follow-up Discussed carotid sinus massage, Valsalva, calling EMTs for prolonged arrhythmia -Would hope arrhythmia will be self-limiting as hypoxia improves -We will arrange outpatient follow-up including echocardiogram   Total encounter time more than 110 minutes  Greater than 50% was spent in counseling and coordination of care with the patient   For questions or updates, please contact [place] Please consult [email or web address] for contact info under    Signed, Ida Rogue, MD  11/01/2021 1:59 PM

## 2021-11-01 NOTE — Discharge Summary (Addendum)
[facility] NAME: Dylan Weber    MR#:  [id number]  DATE OF BIRTH:  August 04, 1933  DATE OF ADMISSION:  10/31/2021 ADMITTING PHYSICIAN: Leslee Home, DO  DATE OF DISCHARGE: 11/01/2021  3:12 PM  PRIMARY CARE PHYSICIAN: [place]    ADMISSION DIAGNOSIS:  COVID-19 [U07.1] Hypoxia [R09.02]  DISCHARGE DIAGNOSIS:  Principal Problem:   COVID-19 Active Problems:   Hypoxia   SECONDARY DIAGNOSIS:  History reviewed. No pertinent past medical history.  HOSPITAL COURSE:   Acute hypoxic now chronic respiratory failure.  Initially this morning I was called to see the patient and secondary to increasing oxygen requirements.  The patient stated he felt fine and wanted to go home.  I took him off the 100% nonrebreather and he did not drop his saturations.  We ended up getting an ABG on room air which showed a PO2 of 56 and an O2 saturation of 91.2.  I do not believe that the pulse ox was correlating to what the blood gas was showing.  With ambulation the patient did drop his O2 sats down into the 70s.  The patient was interested in going home and he was prescribed 2 L of oxygen upon discharge.  Pulse ox with ambulating on 2 L of oxygen ranged between 88 and 92%.  D-dimer normal range.  Will need a 6-minute exercise test to come off oxygen in the future Atrial tachycardia/SVT.  Seen by cardiology and they started on Cardizem CD.  They will follow-up as outpatient and get an echo. Recent COVID-19 infection.  Patient did have a 5-day course of Paxlovid and prednisone.  I did give 1 dose of remdesivir while here in the hospital but the patient did not want to stay in the hospital any longer.  Remdesivir discontinued. COPD exacerbation.  Continue Breo inhaler.  With the acute hypoxic respiratory failure I did prescribe Solu-Medrol.  I also prescribed a prednisone taper upon disposition.  Rescue albuterol inhaler prescribed. GERD on  proton  DISCHARGE CONDITIONS:   Satisfactory  CONSULTS OBTAINED:  Treatment Team:  Minna Merritts, MD  DRUG ALLERGIES:  No Known Allergies  DISCHARGE MEDICATIONS:   Allergies as of 11/01/2021   No Known Allergies      Medication List     TAKE these medications    albuterol 108 (90 Base) MCG/ACT inhaler Commonly known as: VENTOLIN HFA Inhale 2 puffs into the lungs every 6 (six) hours as needed for wheezing or shortness of breath.   ascorbic acid 500 MG tablet Commonly known as: VITAMIN C Take 1 tablet (500 mg total) by mouth daily. Start taking on: November 02, 2021   Breo Ellipta 200-25 MCG/ACT Aepb Generic drug: fluticasone furoate-vilanterol Inhale 1 puff into the lungs daily.   diltiazem 120 MG 24 hr capsule Commonly known as: CARDIZEM CD Take 1 capsule (120 mg total) by mouth daily.   guaiFENesin 600 MG 12 hr tablet Commonly known as: MUCINEX Take 600 mg by mouth daily.   multivitamin capsule Take 1 capsule by mouth daily.   pantoprazole 40 MG tablet Commonly known as: PROTONIX Take 40 mg by mouth daily.   predniSONE 10 MG tablet Commonly known as: DELTASONE 4 tabs po day1,2; 3 tabs po day3,4; 2 tabs po day5,6; 1 tab po day 7,8; 1/2 tab po day9,10   pseudoephedrine-acetaminophen 30-500 MG Tabs tablet Commonly known as: TYLENOL SINUS Take 1 tablet by mouth as needed.   Tylenol 8 Hour Arthritis  Pain 650 MG CR tablet Generic drug: acetaminophen Take 650 mg by mouth as needed for pain.   zinc sulfate 220 (50 Zn) MG capsule Take 1 capsule (220 mg total) by mouth daily. Start taking on: November 02, 2021               Durable Medical Equipment  (From admission, onward)           Start     Ordered   11/01/21 1045  For home use only DME oxygen  Once       Question Answer Comment  Length of Need Lifetime   Mode or (Route) Nasal cannula   Liters per Minute 2   Frequency Continuous (stationary and portable oxygen unit needed)    Oxygen conserving device Yes   Oxygen delivery system Gas      11/01/21 1044             DISCHARGE INSTRUCTIONS:   Follow-up PMD 5 days Follow-up cardiology 1 to 2 weeks  If you experience worsening of your admission symptoms, develop shortness of breath, life threatening emergency, suicidal or homicidal thoughts you must seek medical attention immediately by calling 911 or calling your MD immediately  if symptoms less severe.  You Must read complete instructions/literature along with all the possible adverse reactions/side effects for all the Medicines you take and that have been prescribed to you. Take any new Medicines after you have completely understood and accept all the possible adverse reactions/side effects.   Please note  You were cared for by a hospitalist during your hospital stay. If you have any questions about your discharge medications or the care you received while you were in the hospital after you are discharged, you can call the unit and asked to speak with the hospitalist on call if the hospitalist that took care of you is not available. Once you are discharged, your primary care physician will handle any further medical issues. Please note that NO REFILLS for any discharge medications will be authorized once you are discharged, as it is imperative that you return to your primary care physician (or establish a relationship with a primary care physician if you do not have one) for your aftercare needs so that they can reassess your need for medications and monitor your lab values.    Today   CHIEF COMPLAINT:   Chief Complaint  Patient presents with   Near Syncope    HISTORY OF PRESENT ILLNESS:  Dylan Weber  is a 87 y.o. male came in with near syncope and found to be hypoxic.  Recent COVID-19 infection.   VITAL SIGNS:  Blood pressure 129/82, pulse (!) 107, temperature 98 F (36.7 C), temperature source Oral, resp. rate 18, height 5\' 9"  (1.753 m),  weight 78.5 kg, SpO2 92 %.  I/O:   Intake/Output Summary (Last 24 hours) at 11/01/2021 1735 Last data filed at 11/01/2021 1137 Gross per 24 hour  Intake 1290 ml  Output --  Net 1290 ml    PHYSICAL EXAMINATION:  GENERAL:  87 y.o.-year-old patient lying in the bed with no acute distress.  EYES: Pupils equal, round, reactive to light and accommodation. No scleral icterus.  HEENT: Head atraumatic, normocephalic. Oropharynx and nasopharynx clear.  LUNGS: Decreased breath sounds bilaterally, no wheezing, rales,rhonchi or crepitation. No use of accessory muscles of respiration.  CARDIOVASCULAR: S1, S2 normal. No murmurs, rubs, or gallops.  ABDOMEN: Soft, non-tender, non-distended.  EXTREMITIES: No pedal edema.  Positive for clubbing on fingers. NEUROLOGIC:  Cranial nerves II through XII are intact. Muscle strength 5/5 in all extremities. Sensation intact. Gait not checked.  PSYCHIATRIC: The patient is alert and oriented x 3.  SKIN: No obvious rash, lesion, or ulcer.   DATA REVIEW:   CBC Recent Labs  Lab 10/31/21 1543  WBC 9.7  HGB 15.4  HCT 45.9  PLT 207    Chemistries  Recent Labs  Lab 10/31/21 1543  NA 135  K 3.9  CL 100  CO2 28  GLUCOSE 109*  BUN 24*  CREATININE 0.97  CALCIUM 8.9  AST 25  ALT 20  ALKPHOS 55  BILITOT 0.6     Microbiology Results  Results for orders placed or performed during the hospital encounter of 10/31/21  Resp Panel by RT-PCR (Flu A&B, Covid) Nasopharyngeal Swab     Status: Abnormal   Collection Time: 10/31/21  4:15 PM   Specimen: Nasopharyngeal Swab; Nasopharyngeal(NP) swabs in vial transport medium  Result Value Ref Range Status   SARS Coronavirus 2 by RT PCR POSITIVE (A) NEGATIVE Final    Comment: (NOTE) SARS-CoV-2 target nucleic acids are DETECTED.  The SARS-CoV-2 RNA is generally detectable in upper respiratory specimens during the acute phase of infection. Positive results are indicative of the presence of the identified virus,  but do not rule out bacterial infection or co-infection with other pathogens not detected by the test. Clinical correlation with patient history and other diagnostic information is necessary to determine patient infection status. The expected result is Negative.  Fact Sheet for Patients: EntrepreneurPulse.com.au  Fact Sheet for Healthcare Providers: IncredibleEmployment.be  This test is not yet approved or cleared by the [place] FDA and  has been authorized for detection and/or diagnosis of SARS-CoV-2 by FDA under an Emergency Use Authorization (EUA).  This EUA will remain in effect (meaning this test can be used) for the duration of  the COVID-19 declaration under Section 564(b)(1) of the A ct, 21 U.S.C. section 360bbb-3(b)(1), unless the authorization is terminated or revoked sooner.     Influenza A by PCR NEGATIVE NEGATIVE Final   Influenza B by PCR NEGATIVE NEGATIVE Final    Comment: (NOTE) The Xpert Xpress SARS-CoV-2/FLU/RSV plus assay is intended as an aid in the diagnosis of influenza from Nasopharyngeal swab specimens and should not be used as a sole basis for treatment. Nasal washings and aspirates are unacceptable for Xpert Xpress SARS-CoV-2/FLU/RSV testing.  Fact Sheet for Patients: EntrepreneurPulse.com.au  Fact Sheet for Healthcare Providers: IncredibleEmployment.be  This test is not yet approved or cleared by the [place] FDA and has been authorized for detection and/or diagnosis of SARS-CoV-2 by FDA under an Emergency Use Authorization (EUA). This EUA will remain in effect (meaning this test can be used) for the duration of the COVID-19 declaration under Section 564(b)(1) of the Act, 21 U.S.C. section 360bbb-3(b)(1), unless the authorization is terminated or revoked.  Performed at [facility], [address] [place], [place] [address]     RADIOLOGY:  DG Chest 2  View  Result Date: 10/31/2021 CLINICAL DATA:  Syncope EXAM: CHEST - 2 VIEW COMPARISON:  None. FINDINGS: The heart size and mediastinal contours are within normal limits. Shallow inspiration with low lung volumes. Both lungs are clear. No pleural effusion or pneumothorax. The visualized skeletal structures are unremarkable. IMPRESSION: No active cardiopulmonary disease. Electronically Signed   By: Macy Mis M.D.   On: 10/31/2021 16:18      Management plans discussed with the patient, family and they are in agreement.  CODE STATUS:     Code Status Orders  (From admission, onward)           Start     Ordered   10/31/21 1755  Full code  Continuous        10/31/21 1800           Code Status History     This patient has a current code status but no historical code status.       TOTAL TIME TAKING CARE OF THIS PATIENT: 35 minutes.    Loletha Grayer M.D on 11/01/2021 at 5:35 PM  [facility]  CC: Primary care physician; [place]

## 2021-11-01 NOTE — Care Management Obs Status (Signed)
[facility] NOTIFICATION   Patient Details  Name: [place] Shannahan MRN: [id number] Date of Birth: [birth year]   Medicare Observation Status Notification Given:  Yes    Shelbie Hutching, RN 11/01/2021, 12:57 PM

## 2021-11-01 NOTE — Consult Note (Signed)
Remdesivir - Pharmacy Brief Note   O:  ALT: 20 CXR: No active cardiopulmonary disease. SpO2: 88% on 15L HFNC   A/P:  Remdesivir 200 mg IVPB once followed by 100 mg IVPB daily x 4 days.   Pearla Dubonnet, PharmD Clinical Pharmacist 11/01/2021 8:40 AM

## 2021-11-10 ENCOUNTER — Other Ambulatory Visit: Payer: Medicare Other

## 2021-11-14 ENCOUNTER — Other Ambulatory Visit: Payer: Self-pay | Admitting: Pulmonary Disease

## 2021-11-14 DIAGNOSIS — R0602 Shortness of breath: Secondary | ICD-10-CM

## 2021-11-17 ENCOUNTER — Ambulatory Visit
Admission: RE | Admit: 2021-11-17 | Discharge: 2021-11-17 | Disposition: A | Discharge status: Home / Self Care | Payer: Medicare Other | Source: Ambulatory Visit | Attending: Pulmonary Disease | Admitting: Pulmonary Disease

## 2021-11-17 DIAGNOSIS — R0602 Shortness of breath: Secondary | ICD-10-CM | POA: Insufficient documentation

## 2021-11-17 DIAGNOSIS — I2510 Atherosclerotic heart disease of native coronary artery without angina pectoris: Secondary | ICD-10-CM

## 2021-11-17 HISTORY — DX: Atherosclerotic heart disease of native coronary artery without angina pectoris: I25.10

## 2021-11-17 MED ORDER — APIXABAN 2.5 MG PO TABS: 2.5000 mg | ORAL_TABLET | Freq: Two times a day (BID) | ORAL | 0 refills | Status: DC

## 2021-11-17 MED ORDER — IOHEXOL 350 MG/ML SOLN
75.0000 mL | Freq: Once | INTRAVENOUS | Status: AC | PRN
  Administered 2021-11-17: 75 mL via INTRAVENOUS

## 2021-11-23 ENCOUNTER — Other Ambulatory Visit: Payer: Medicare Other

## 2021-11-24 ENCOUNTER — Inpatient Hospital Stay
Admission: EM | Admit: 2021-11-24 | Discharge: 2021-11-27 | DRG: 175 | Disposition: A | Discharge status: Home / Self Care | Payer: Medicare Other | Attending: Internal Medicine | Admitting: Internal Medicine

## 2021-11-24 ENCOUNTER — Observation Stay: Payer: Medicare Other

## 2021-11-24 ENCOUNTER — Encounter: Payer: Self-pay | Admitting: Emergency Medicine

## 2021-11-24 ENCOUNTER — Other Ambulatory Visit: Payer: Self-pay

## 2021-11-24 DIAGNOSIS — R778 Other specified abnormalities of plasma proteins: Secondary | ICD-10-CM | POA: Diagnosis present

## 2021-11-24 DIAGNOSIS — I2693 Single subsegmental pulmonary embolism without acute cor pulmonale: Secondary | ICD-10-CM | POA: Diagnosis not present

## 2021-11-24 DIAGNOSIS — Z87891 Personal history of nicotine dependence: Secondary | ICD-10-CM

## 2021-11-24 DIAGNOSIS — I2699 Other pulmonary embolism without acute cor pulmonale: Secondary | ICD-10-CM | POA: Diagnosis present

## 2021-11-24 DIAGNOSIS — K219 Gastro-esophageal reflux disease without esophagitis: Secondary | ICD-10-CM | POA: Diagnosis present

## 2021-11-24 DIAGNOSIS — R55 Syncope and collapse: Secondary | ICD-10-CM | POA: Diagnosis not present

## 2021-11-24 DIAGNOSIS — Z808 Family history of malignant neoplasm of other organs or systems: Secondary | ICD-10-CM

## 2021-11-24 DIAGNOSIS — U099 Post covid-19 condition, unspecified: Secondary | ICD-10-CM | POA: Diagnosis present

## 2021-11-24 DIAGNOSIS — Z86718 Personal history of other venous thrombosis and embolism: Secondary | ICD-10-CM

## 2021-11-24 DIAGNOSIS — Z79899 Other long term (current) drug therapy: Secondary | ICD-10-CM

## 2021-11-24 DIAGNOSIS — D696 Thrombocytopenia, unspecified: Secondary | ICD-10-CM

## 2021-11-24 DIAGNOSIS — J439 Emphysema, unspecified: Secondary | ICD-10-CM | POA: Diagnosis present

## 2021-11-24 DIAGNOSIS — J9621 Acute and chronic respiratory failure with hypoxia: Secondary | ICD-10-CM | POA: Diagnosis present

## 2021-11-24 DIAGNOSIS — Z7952 Long term (current) use of systemic steroids: Secondary | ICD-10-CM

## 2021-11-24 DIAGNOSIS — I480 Paroxysmal atrial fibrillation: Secondary | ICD-10-CM | POA: Diagnosis present

## 2021-11-24 DIAGNOSIS — I248 Other forms of acute ischemic heart disease: Secondary | ICD-10-CM | POA: Diagnosis present

## 2021-11-24 DIAGNOSIS — Z91138 Patient's unintentional underdosing of medication regimen for other reason: Secondary | ICD-10-CM

## 2021-11-24 DIAGNOSIS — I2510 Atherosclerotic heart disease of native coronary artery without angina pectoris: Secondary | ICD-10-CM | POA: Diagnosis present

## 2021-11-24 DIAGNOSIS — I4510 Unspecified right bundle-branch block: Secondary | ICD-10-CM | POA: Diagnosis present

## 2021-11-24 DIAGNOSIS — R001 Bradycardia, unspecified: Secondary | ICD-10-CM | POA: Diagnosis not present

## 2021-11-24 DIAGNOSIS — I700 Atherosclerosis of aorta: Secondary | ICD-10-CM | POA: Diagnosis present

## 2021-11-24 DIAGNOSIS — Z9981 Dependence on supplemental oxygen: Secondary | ICD-10-CM

## 2021-11-24 DIAGNOSIS — R42 Dizziness and giddiness: Secondary | ICD-10-CM | POA: Diagnosis present

## 2021-11-24 DIAGNOSIS — T457X6A Underdosing of anticoagulant antagonist, vitamin K and other coagulants, initial encounter: Secondary | ICD-10-CM | POA: Diagnosis present

## 2021-11-24 DIAGNOSIS — Z8616 Personal history of COVID-19: Secondary | ICD-10-CM

## 2021-11-24 DIAGNOSIS — I471 Supraventricular tachycardia: Secondary | ICD-10-CM | POA: Diagnosis not present

## 2021-11-24 DIAGNOSIS — Z9114 Patient's other noncompliance with medication regimen: Secondary | ICD-10-CM

## 2021-11-24 DIAGNOSIS — Z8049 Family history of malignant neoplasm of other genital organs: Secondary | ICD-10-CM

## 2021-11-24 HISTORY — DX: Gastro-esophageal reflux disease without esophagitis: K21.9

## 2021-11-24 HISTORY — DX: Supraventricular tachycardia, unspecified: I47.10

## 2021-11-24 HISTORY — DX: COVID-19: U07.1

## 2021-11-24 HISTORY — DX: Chronic obstructive pulmonary disease, unspecified: J44.9

## 2021-11-24 HISTORY — DX: Supraventricular tachycardia: I47.1

## 2021-11-24 LAB — BASIC METABOLIC PANEL
Anion gap: 8 (ref 5–15)
BUN: 18 mg/dL (ref 8–23)
CO2: 26 mmol/L (ref 22–32)
Calcium: 9 mg/dL (ref 8.9–10.3)
Chloride: 103 mmol/L (ref 98–111)
Creatinine, Ser: 0.88 mg/dL (ref 0.61–1.24)
GFR, Estimated: >60 mL/min (ref >60)
Glucose, Bld: 101 mg/dL — ABNORMAL HIGH (ref 70–99)
Potassium: 3.8 mmol/L (ref 3.5–5.1)
Sodium: 137 mmol/L (ref 135–145)

## 2021-11-24 LAB — PROTIME-INR
INR: 1 (ref 0.8–1.2)
Prothrombin Time: 13.5 seconds (ref 11.4–15.2)

## 2021-11-24 LAB — HEMOGLOBIN A1C
Hgb A1c MFr Bld: 5.8 % — ABNORMAL HIGH (ref 4.8–5.6)
Mean Plasma Glucose: 119.76 mg/dL

## 2021-11-24 LAB — CBC
HCT: 42.7 % (ref 39.0–52.0)
Hemoglobin: 14.2 g/dL (ref 13.0–17.0)
MCH: 31.3 pg (ref 26.0–34.0)
MCHC: 33.3 g/dL (ref 30.0–36.0)
MCV: 94.3 fL (ref 80.0–100.0)
Platelets: 122 10*3/uL — ABNORMAL LOW (ref 150–400)
RBC: 4.53 MIL/uL (ref 4.22–5.81)
RDW: 14.3 % (ref 11.5–15.5)
WBC: 8.2 10*3/uL (ref 4.0–10.5)
nRBC: 0 % (ref 0.0–0.2)

## 2021-11-24 LAB — APTT: aPTT: 27 seconds (ref 24–36)

## 2021-11-24 LAB — TROPONIN I (HIGH SENSITIVITY)
Troponin I (High Sensitivity): 40 ng/L — ABNORMAL HIGH (ref <18)
Troponin I (High Sensitivity): 36 ng/L — ABNORMAL HIGH (ref <18)
Troponin I (High Sensitivity): 33 ng/L — ABNORMAL HIGH (ref <18)

## 2021-11-24 LAB — HEPARIN LEVEL (UNFRACTIONATED): Heparin Unfractionated: <0.1 IU/mL — ABNORMAL LOW (ref 0.30–0.70)

## 2021-11-24 LAB — URINALYSIS, ROUTINE W REFLEX MICROSCOPIC
Bilirubin Urine: NEGATIVE
Glucose, UA: NEGATIVE mg/dL
Hgb urine dipstick: NEGATIVE
Ketones, ur: NEGATIVE mg/dL
Leukocytes,Ua: NEGATIVE
Nitrite: NEGATIVE
Protein, ur: NEGATIVE mg/dL
Specific Gravity, Urine: 1.016 (ref 1.005–1.030)
pH: 7 (ref 5.0–8.0)

## 2021-11-24 MED ORDER — MULTIVITAMINS PO CAPS: 1.0000 | ORAL_CAPSULE | Freq: Every day | ORAL | Status: DC

## 2021-11-24 MED ORDER — ADULT MULTIVITAMIN W/MINERALS CH
1.0000 | ORAL_TABLET | Freq: Every day | ORAL | Status: DC
  Administered 2021-11-25 – 2021-11-27 (×3): 1 via ORAL
  Filled 2021-11-24 (×3): qty 1

## 2021-11-24 MED ORDER — ASCORBIC ACID 500 MG PO TABS
500.0000 mg | ORAL_TABLET | Freq: Every day | ORAL | Status: DC
  Administered 2021-11-25 – 2021-11-27 (×3): 500 mg via ORAL
  Filled 2021-11-24 (×3): qty 1

## 2021-11-24 MED ORDER — TRAZODONE HCL 50 MG PO TABS
25.0000 mg | ORAL_TABLET | Freq: Every evening | ORAL | Status: DC | PRN
  Administered 2021-11-24 – 2021-11-26 (×2): 25 mg via ORAL
  Filled 2021-11-24 (×2): qty 1

## 2021-11-24 MED ORDER — HEPARIN BOLUS VIA INFUSION
4000.0000 [IU] | Freq: Once | INTRAVENOUS | Status: AC
  Administered 2021-11-24: 4000 [IU] via INTRAVENOUS
  Filled 2021-11-24: qty 4000

## 2021-11-24 MED ORDER — SODIUM CHLORIDE 0.9 % IV BOLUS
500.0000 mL | Freq: Once | INTRAVENOUS | Status: AC
  Administered 2021-11-24: 500 mL via INTRAVENOUS

## 2021-11-24 MED ORDER — ZINC SULFATE 220 (50 ZN) MG PO CAPS
220.0000 mg | ORAL_CAPSULE | Freq: Every day | ORAL | Status: DC
  Administered 2021-11-25 – 2021-11-27 (×3): 220 mg via ORAL
  Filled 2021-11-24 (×3): qty 1

## 2021-11-24 MED ORDER — FLUTICASONE FUROATE-VILANTEROL 200-25 MCG/ACT IN AEPB
1.0000 | INHALATION_SPRAY | Freq: Every day | RESPIRATORY_TRACT | Status: DC
  Administered 2021-11-25 – 2021-11-27 (×3): 1 via RESPIRATORY_TRACT
  Filled 2021-11-24: qty 28

## 2021-11-24 MED ORDER — ALBUTEROL SULFATE HFA 108 (90 BASE) MCG/ACT IN AERS
2.0000 | INHALATION_SPRAY | RESPIRATORY_TRACT | Status: DC | PRN
  Filled 2021-11-24: qty 6.7

## 2021-11-24 MED ORDER — HYDRALAZINE HCL 20 MG/ML IJ SOLN: 5.0000 mg | INTRAMUSCULAR | Status: DC | PRN

## 2021-11-24 MED ORDER — DILTIAZEM HCL ER COATED BEADS 120 MG PO CP24
120.0000 mg | ORAL_CAPSULE | Freq: Every day | ORAL | Status: DC
  Administered 2021-11-25: 120 mg via ORAL
  Filled 2021-11-24: qty 1

## 2021-11-24 MED ORDER — ONDANSETRON HCL 4 MG/2ML IJ SOLN: 4.0000 mg | Freq: Three times a day (TID) | INTRAMUSCULAR | Status: DC | PRN

## 2021-11-24 MED ORDER — DM-GUAIFENESIN ER 30-600 MG PO TB12: 1.0000 | ORAL_TABLET | Freq: Two times a day (BID) | ORAL | Status: DC | PRN

## 2021-11-24 MED ORDER — HEPARIN (PORCINE) 25000 UT/250ML-% IV SOLN
1500.0000 [IU]/h | INTRAVENOUS | Status: DC
  Administered 2021-11-24: 1300 [IU]/h via INTRAVENOUS
  Administered 2021-11-25: 1500 [IU]/h via INTRAVENOUS
  Filled 2021-11-24 (×2): qty 250

## 2021-11-24 MED ORDER — ASPIRIN EC 81 MG PO TBEC
81.0000 mg | DELAYED_RELEASE_TABLET | Freq: Every day | ORAL | Status: DC
  Administered 2021-11-24 – 2021-11-26 (×3): 81 mg via ORAL
  Filled 2021-11-24 (×3): qty 1

## 2021-11-24 MED ORDER — ACETAMINOPHEN 325 MG PO TABS
650.0000 mg | ORAL_TABLET | Freq: Four times a day (QID) | ORAL | Status: DC | PRN
  Administered 2021-11-25 (×2): 650 mg via ORAL
  Filled 2021-11-24 (×2): qty 2

## 2021-11-24 MED ORDER — PANTOPRAZOLE SODIUM 40 MG PO TBEC
40.0000 mg | DELAYED_RELEASE_TABLET | Freq: Every day | ORAL | Status: DC
  Administered 2021-11-24 – 2021-11-27 (×4): 40 mg via ORAL
  Filled 2021-11-24 (×4): qty 1

## 2021-11-24 NOTE — Progress Notes (Signed)
Pt up to stand at bedside to use urinal, hr up to 130s and pox down to 90%. Pt rebounded back to 101 hr and pox 94% when sitting. Pt states "when am I going to go home tomorrow?" Pt informed that at this time there is no plan to discharge him tomorrow. Pt educated on PE diagnosis.

## 2021-11-24 NOTE — Progress Notes (Signed)
Cannot complete orthostatic vital signs at this time. Pt cannot tolerate l ying flat, see previous note regarding exertional tachycardia.

## 2021-11-24 NOTE — ED Triage Notes (Signed)
C/O 'near syncopal' event this morning while shaving. Describes feeling very flushed at the time.  Patient states feels fine now, just feeling scared.

## 2021-11-24 NOTE — Progress Notes (Signed)
Pt requesting something to help him sleep, order for trazodone received.

## 2021-11-24 NOTE — Consult Note (Signed)
[place] for IV Heparin Indication: pulmonary embolus  Patient Measurements: Height: 5\' 9"  (175.3 cm) Weight: 78.4 kg (172 lb 13.5 oz) IBW/kg (Calculated) : 70.7 Heparin Dosing Weight: 78.4  Labs: Recent Labs    11/24/21 1107 11/24/21 1340  HGB 14.2  --   HCT 42.7  --   PLT 122*  --   CREATININE 0.88  --   TROPONINIHS  --  40*    Estimated Creatinine Clearance: 60.3 mL/min (by C-G formula based on SCr of 0.88 mg/dL).   Medical History: History reviewed. No pertinent past medical history.  Medications:  Patient apparently diagnosed with acute PE on 11/17/21 and started on apixaban, however, per chart review patient was unaware he had a PE and that he was supposed to be taking a blood thinner  Assessment: Patient is an 88 y/o M with medical history including COPD, SVT, recent COVID-19 infection , recent diagnosis of acute PE on 11/17/21 who presented to the ED 1/13 with near syncope. Un-clear at this time if patient was taking Eliquis as prescribed for recently diagnosed PE, but per chart, endorses being unaware of being on a blood thinner. Pharmacy consulted to initiate heparin infusion for PE.  Baseline CBC acceptable, platelets mildly low at 122. Baseline aPTT, PT-INR, and heparin level are pending.  Goal of Therapy:  Heparin level 0.3-0.7 units/ml aPTT 66-102 seconds Monitor platelets by anticoagulation protocol: Yes   Plan:  --Heparin 4000 unit IV bolus followed by continuous infusion at 1300 units/hr --Re-check aPTT and HL 8 hours after initiation of infusion. May be able to follow HL, will depend on what baseline is. Otherwise follow aPTT until correlation with HL established --Daily CBC per protocol while on IV heparin  Benita Gutter 11/24/2021,4:19 PM

## 2021-11-24 NOTE — ED Provider Notes (Signed)
[facility] Provider Note    Event Date/Time   First MD Initiated Contact with Patient 11/24/21 1337     (approximate)   History   Near Syncope   HPI  Dylan Weber is a 88 y.o. male  with COPD, SVT, recent covid on 2L who comes in for near syncope.  Pt Woke up this morning and was shaving and he felt flushed lightheaded.  Patient is supposed be on 2 L of oxygen.  It sounds like after he had this episode he sat down and checked his oxygen level and his oxygen levels were fine in the 90s however he did put himself back on the 2 L of oxygen.  Patient does report that he was recently started on diltiazem.  Didn't take it in morning but then took it after.  He was in the waiting room and had another episode of feeling flushed.  No fevers. Denies chest pain, cough, wheezing. No dizzyness.  He did not fall.  He was able to sit down.  He denies having LOC.  He does report some chronic shortness of breath but that is secondary to his recent COVID  Patient was admitted from 12/20-12/21.  I reviewed these records where he came in for COVID with hypoxic respiratory failure.  Patient was discharged on 2 L of oxygen with ambulation his oxygen was being 80 and 92%.  Patient did have some SVT and was started on Cardizem  I reviewed the CT results from 1/6 where patient had a CT angio that was concerning for PE.  Appears that patient was started on Eliquis and a telephone note was noted but patient denies anybody ever called him and was never told about being started on a blood thinner.  He did not know that he had a pulmonary embolism.  He did not know that he had a pulmonary embolism.      Physical Exam   Triage Vital Signs: ED Triage Vitals  Enc Vitals Group     BP 11/24/21 1106 (!) 144/74     Pulse Rate 11/24/21 1106 78     Resp 11/24/21 1106 16     Temp 11/24/21 1106 97.8 F (36.6 C)     Temp src --      SpO2 11/24/21 1106 90 %     Weight 11/24/21 1105 172 lb  13.5 oz (78.4 kg)     Height 11/24/21 1105 5\' 9"  (1.753 m)     Head Circumference --      Peak Flow --      Pain Score 11/24/21 1105 0     Pain Loc --      Pain Edu? --      Excl. in [place]? --     Most recent vital signs: Vitals:   11/24/21 1106  BP: (!) 144/74  Pulse: 78  Resp: 16  Temp: 97.8 F (36.6 C)  SpO2: 90%     General: Awake, no distress.  Well-appearing elderly male CV:  Good peripheral perfusion.  Grossly normal heart sounds Resp:  Normal effort.  Clear lungs, on 2 L at baseline Abd:  No distention.  Soft and nontender  Leg 1+ edema bilaterally no calf tenderness     ED Results / Procedures / Treatments   Labs (all labs ordered are listed, but only abnormal results are displayed) Labs Reviewed  BASIC METABOLIC PANEL - Abnormal; Notable for the following components:      Result Value   Glucose,  Bld 101 (*)    All other components within normal limits  CBC - Abnormal; Notable for the following components:   Platelets 122 (*)    All other components within normal limits  TROPONIN I (HIGH SENSITIVITY) - Abnormal; Notable for the following components:   Troponin I (High Sensitivity) 40 (*)    All other components within normal limits  URINALYSIS, ROUTINE W REFLEX MICROSCOPIC  CBG MONITORING, ED     EKG  My interpretation of EKG:   Normal sinus rate of 80, no st elevation, no twi, incomplete RBBB     PROCEDURES:  Critical Care performed: Yes, see critical care procedure note(s)  .Critical Care Performed by: Vanessa Groveland, MD Authorized by: Vanessa [place], MD   Critical care provider statement:    Critical care time (minutes):  30   Critical care was necessary to treat or prevent imminent or life-threatening deterioration of the following conditions: PE on heparin.   Critical care was time spent personally by me on the following activities:  Development of treatment plan with patient or surrogate, discussions with consultants, evaluation of  patient's response to treatment, examination of patient, ordering and review of laboratory studies, ordering and review of radiographic studies, ordering and performing treatments and interventions, pulse oximetry, re-evaluation of patient's condition and review of old [place] ED: Medications - No data to display   IMPRESSION / MDM / [place] / ED COURSE  I reviewed the triage vital signs and the nursing notes.                              Patient with recent COVID on 2 L who comes in with concerns for near syncopal episode. Differential diagnosis includes, but is not limited to, arrhythmia, ACS, PE.  Abdomen soft and nontender low suspicion for acute abdominal pathology.  Patient's troponin is slightly elevated.  This could be from the PE versus from an arrhythmia.  His hemoglobin is normal and he denies any falls, hitting his head or issues with GI bleeding.  No evidence of electrolyte abnormalities.  His kidney function is normal  On review of the records it appears the patient was actually diagnosed with a pulmonary embolism a few days ago but unfortunately patient not been told about it has not started any of the blood thinners.  I do have concern that patient could have PE that could have caused him to have this near syncopal episode.  There is also concern that he could have an arrhythmia.  EKG without evidence of arrhythmia at this time but I do feel he patient needs admission for heparin, cardiac monitoring, echocardiogram.  Do not feel we need to re CT PE given he is otherwise stable with normal vital signs.  Will initiate heparin therapy and discussed with the hospital team for admission.  They can get an echocardiogram and decide based upon that if he needs repeat imaging.  He was supposed to have echocardiogram outpatient but this has not happened yet.      FINAL CLINICAL IMPRESSION(S) / ED DIAGNOSES   Final diagnoses:  Near syncope  Single  subsegmental pulmonary embolism without acute cor pulmonale ([place])     Rx / DC Orders   ED Discharge Orders     None        Note:  This document was prepared using Dragon voice recognition software and may include unintentional  dictation errors.   [name], MD 11/24/21 [phone]

## 2021-11-24 NOTE — H&P (Signed)
History and Physical    Dylan Weber OEV:[id number] DOB: 10/14/1933 DOA: 11/24/2021  Referring MD/NP/PA:   PCP: [employer]   Patient coming from:  The patient is coming from home.    Chief Complaint: near syncope  HPI: Dylan Weber is a 88 y.o. male with medical history significant of COPD, GERD, SVT, recent admission for COVID-19 infection discharged on 2 L oxygen, PE, who presents with near syncope.  Patient was recently hospitalized from 12/20-2012/21 due to COVID-19 infection.  Patient was treated with 1 dose of remdesivir and a course of extra weight and prednisone.  Patient was discharged home on 2 L oxygen. Due to SOB, his pulmonologist, Dr. Lanney Gins ordered CT angiogram on 11/17/2021, which showed small PE.  Patient was given prescription of Eliquis, but patient did not realize this prescription and has not started taking this medication yet.  Pt states that he woke up this morning, feeling flushed and lightheaded. He was in the waiting room and had another episode of feeling flushed in ED. No LOC or fall. He has SOB, denies chest pain, cough, fever or chills.  No nausea vomiting, diarrhea or abdominal pain.  No symptoms of UTI.  No rectal bleeding or dark stool.  No unilateral numbness or tinglings in extremities.  No facial droop or slurred speech.   CTA of chest on 11/17/21 Small peripheral pulmonary embolus is noted in lower lobe branch of right pulmonary artery. Mild coronary artery calcifications are noted.  Aortic Atherosclerosis (ICD10-I70.0) and Emphysema (ICD10-J43.9).   ED Course: pt was found to have troponin level 40, WBC 8.2, GFR > 60, temperature normal, blood pressure 146/97, heart rate 94, RR 20, oxygen saturation 90-93% on 2 L oxygen. Pt is placed on progressive bed for observation.  Review of Systems:   General: no fevers, chills, no body weight gain, has fatigue HEENT: no blurry vision, hearing changes or sore throat Respiratory: has dyspnea, no  coughing, no wheezing CV: no chest pain, no palpitations GI: no nausea, vomiting, abdominal pain, diarrhea, constipation GU: no dysuria, burning on urination, increased urinary frequency, hematuria  Ext: no leg edema Neuro: no unilateral weakness, numbness, or tingling, no vision change or hearing loss. Has lightheadedness Skin: no rash, no skin tear. MSK: No muscle spasm, no deformity, no limitation of range of movement in spin Heme: No easy bruising.  Travel history: No recent long distant travel.  Allergy: No Known Allergies  Past Medical History:  Diagnosis Date   COPD (chronic obstructive pulmonary disease) (HCC)    COVID-19    GERD (gastroesophageal reflux disease)    SVT (supraventricular tachycardia) (HCC)     Past Surgical History:  Procedure Laterality Date   APPENDECTOMY     CHOLECYSTECTOMY      Social History:  reports that he has quit smoking. His smoking use included cigarettes. He has never used smokeless tobacco. He reports that he does not currently use alcohol. He reports that he does not use drugs.  Family History:  Family History  Problem Relation Age of Onset   Uterine cancer Mother    Brain cancer Sister      Prior to Admission medications   Medication Sig Start Date End Date Taking? Authorizing Provider  acetaminophen (TYLENOL 8 HOUR ARTHRITIS PAIN) 650 MG CR tablet Take 650 mg by mouth as needed for pain.    [provider]  albuterol (VENTOLIN HFA) 108 (90 Base) MCG/ACT inhaler Inhale 2 puffs into the lungs every 6 (six) hours as  needed for wheezing or shortness of breath. 11/01/21   Loletha Grayer, MD  apixaban (ELIQUIS) 2.5 MG TABS tablet Take 1 tablet (2.5 mg total) by mouth 2 (two) times daily. 11/17/21 02/15/22  Ottie Glazier, MD  ascorbic acid (VITAMIN C) 500 MG tablet Take 1 tablet (500 mg total) by mouth daily. 11/02/21   Loletha Grayer, MD  BREO ELLIPTA 200-25 MCG/ACT AEPB Inhale 1 puff into the lungs daily. 10/12/21   [provider]  diltiazem (CARDIZEM CD) 120 MG 24 hr capsule Take 1 capsule (120 mg total) by mouth daily. 11/01/21   Theora Gianotti, NP  guaiFENesin (MUCINEX) 600 MG 12 hr tablet Take 600 mg by mouth daily.    [provider]  Multiple Vitamin (MULTIVITAMIN) capsule Take 1 capsule by mouth daily.    [provider]  pantoprazole (PROTONIX) 40 MG tablet Take 40 mg by mouth daily. 09/20/21   [provider]  predniSONE (DELTASONE) 10 MG tablet 4 tabs po day1,2; 3 tabs po day3,4; 2 tabs po day5,6; 1 tab po day 7,8; 1/2 tab po day9,10 11/01/21   Loletha Grayer, MD  pseudoephedrine-acetaminophen (TYLENOL SINUS) 30-500 MG TABS tablet Take 1 tablet by mouth as needed.    [provider]  zinc sulfate 220 (50 Zn) MG capsule Take 1 capsule (220 mg total) by mouth daily. 11/02/21   Loletha Grayer, MD    Physical Exam: Vitals:   11/24/21 1617 11/24/21 1900 11/24/21 1928 11/24/21 1930  BP:   127/85 (!) 126/94  Pulse: 88 95 97 94  Resp:  19 15 (!) 23  Temp:      SpO2: 91% 92% 94% 91%  Weight:      Height:       General: Not in acute distress HEENT:       Eyes: PERRL, EOMI, no scleral icterus.       ENT: No discharge from the ears and nose, no pharynx injection, no tonsillar enlargement.        Neck: No JVD, no bruit, no mass felt. Heme: No neck lymph node enlargement. Cardiac: S1/S2, RRR, No murmurs, No gallops or rubs. Respiratory: No rales, wheezing, rhonchi or rubs. GI: Soft, nondistended, nontender, no rebound pain, no organomegaly, BS present. GU: No hematuria Ext: No pitting leg edema bilaterally. 1+DP/PT pulse bilaterally. Musculoskeletal: No joint deformities, No joint redness or warmth, no limitation of ROM in spin. Skin: No rashes.  Neuro: Alert, oriented X3, cranial nerves II-XII grossly intact, moves all extremities normally.  Psych: Patient is not psychotic, no suicidal or hemocidal ideation.  Labs on Admission: I have personally  reviewed following labs and imaging studies  CBC: Recent Labs  Lab 11/24/21 1107  WBC 8.2  HGB 14.2  HCT 42.7  MCV 94.3  PLT 009*   Basic Metabolic Panel: Recent Labs  Lab 11/24/21 1107  NA 137  K 3.8  CL 103  CO2 26  GLUCOSE 101*  BUN 18  CREATININE 0.88  CALCIUM 9.0   GFR: Estimated Creatinine Clearance: 60.3 mL/min (by C-G formula based on SCr of 0.88 mg/dL). Liver Function Tests: No results for input(s): AST, ALT, ALKPHOS, BILITOT, PROT, ALBUMIN in the last 168 hours. No results for input(s): LIPASE, AMYLASE in the last 168 hours. No results for input(s): AMMONIA in the last 168 hours. Coagulation Profile: Recent Labs  Lab 11/24/21 1612  INR 1.0   Cardiac Enzymes: No results for input(s): CKTOTAL, CKMB, CKMBINDEX, TROPONINI in the last 168 hours. BNP (last 3  results) No results for input(s): PROBNP in the last 8760 hours. HbA1C: Recent Labs    11/24/21 1612  HGBA1C 5.8*   CBG: No results for input(s): GLUCAP in the last 168 hours. Lipid Profile: No results for input(s): CHOL, HDL, LDLCALC, TRIG, CHOLHDL, LDLDIRECT in the last 72 hours. Thyroid Function Tests: No results for input(s): TSH, T4TOTAL, FREET4, T3FREE, THYROIDAB in the last 72 hours. Anemia Panel: No results for input(s): VITAMINB12, FOLATE, FERRITIN, TIBC, IRON, RETICCTPCT in the last 72 hours. Urine analysis:    Component Value Date/Time   COLORURINE YELLOW (A) 11/24/2021 1600   APPEARANCEUR CLEAR (A) 11/24/2021 1600   LABSPEC 1.016 11/24/2021 1600   PHURINE 7.0 11/24/2021 1600   GLUCOSEU NEGATIVE 11/24/2021 1600   HGBUR NEGATIVE 11/24/2021 1600   BILIRUBINUR NEGATIVE 11/24/2021 1600   KETONESUR NEGATIVE 11/24/2021 1600   PROTEINUR NEGATIVE 11/24/2021 1600   NITRITE NEGATIVE 11/24/2021 1600   LEUKOCYTESUR NEGATIVE 11/24/2021 1600   Sepsis Labs: @LABRCNTIP (procalcitonin:4,lacticidven:4) )No results found for this or any previous visit (from the past 240 hour(s)).   Radiological  Exams on Admission: US Venous Img Lower Bilateral (DVT)  Result Date: 11/24/2021 CLINICAL DATA:  Recent pulmonary embolus. EXAM: Bilateral LOWER EXTREMITY VENOUS DOPPLER ULTRASOUND TECHNIQUE: Gray-scale sonography with compression, as well as color and duplex ultrasound, were performed to evaluate the deep venous system(s) from the level of the common femoral vein through the popliteal and proximal calf veins. COMPARISON:  None. FINDINGS: VENOUS Normal compressibility of the common femoral, superficial femoral, and popliteal veins. Visualized portions of profunda femoral vein and great saphenous vein unremarkable. Doppler waveforms in these veins show normal direction of venous flow, normal respiratory plasticity and response to augmentation. Occlusive thrombus is present in the right peroneal vein. Left peroneal vein unremarkable. OTHER None. Limitations: none IMPRESSION: Occlusive right calf vein DVT in the right peroneal vein. No other DVT identified. Electronically Signed   By: Van Clines M.D.   On: 11/24/2021 17:18     EKG: I have personally reviewed.  Sinus rhythm, QTC 424, early R wave progression, right bundle blockage  Assessment/Plan Principal Problem:   Pulmonary embolism (HCC) Active Problems:   SVT (supraventricular tachycardia) (HCC)   Near syncope   Elevated troponin   Thrombocytopenia ([place])   Pulmonary embolism ([place]): CTA on 11/17/21 showed small peripheral pulmonary embolus in lower lobe branch of right pulmonary artery.  Oxygen saturation 90-93% basal level 2 L oxygen.  -Will place on progressive bed for obs -heparin drip initiated -2D echocardiogram ordered -LE dopplers ordered to evaluate for DVT -prn albuterol   SVT (supraventricular tachycardia) ([place]): HR 94 -Continue home Cardizem  Near syncope: Etiology is not clear.  Potential differential diagnosis include enlargement of PE and orthostatic status.  No focal neurodeficit on physical examination, low  suspicions for stroke. -Check orthostatic vital sign -Follow-up 2D echo -Frequent neuro check -IV fluid: 500 cc normal saline  Elevated troponin: Troponin 40.  No chest pain.  Possibly due to demand ischemia secondary to pulmonary embolism -Trend troponin -Aspirin 81 mg daily -Patient is on IV heparin -Check A1c, FLP  COPD: stable -Bronchodilators  Thrombocytopenia ([place]): Platelets 122.  No bleeding tendency. -Follow-up with CBC   DVT ppx: on IV Heparin     Code Status: Full code Family Communication:  Yes, patient's wife at bed side.  Disposition Plan:  Anticipate discharge back to previous environment Consults called:  none Admission status and  Level of care: Progressive:   for obs    Status  is: Observation  The patient remains OBS appropriate and will d/c before 2 midnights.         Date of Service 11/24/2021    Ivor Costa [facility]   If 7PM-7AM, please contact night-coverage [email or web address] 11/24/2021, 7:48 PM

## 2021-11-24 NOTE — ED Notes (Signed)
Pt was shaving today and began feeling flushed and light headed. Pt states he experienced it again while sitting in the waiting room.

## 2021-11-25 ENCOUNTER — Observation Stay (HOSPITAL_COMMUNITY)
Admit: 2021-11-25 | Discharge: 2021-11-25 | Disposition: A | Discharge status: Home / Self Care | Payer: Medicare Other | Attending: Internal Medicine | Admitting: Internal Medicine

## 2021-11-25 DIAGNOSIS — Z86718 Personal history of other venous thrombosis and embolism: Secondary | ICD-10-CM | POA: Diagnosis not present

## 2021-11-25 DIAGNOSIS — I480 Paroxysmal atrial fibrillation: Secondary | ICD-10-CM | POA: Diagnosis present

## 2021-11-25 DIAGNOSIS — Z8616 Personal history of COVID-19: Secondary | ICD-10-CM | POA: Diagnosis not present

## 2021-11-25 DIAGNOSIS — I471 Supraventricular tachycardia: Secondary | ICD-10-CM | POA: Diagnosis present

## 2021-11-25 DIAGNOSIS — I2609 Other pulmonary embolism with acute cor pulmonale: Secondary | ICD-10-CM

## 2021-11-25 DIAGNOSIS — T457X6A Underdosing of anticoagulant antagonist, vitamin K and other coagulants, initial encounter: Secondary | ICD-10-CM | POA: Diagnosis present

## 2021-11-25 DIAGNOSIS — Z7952 Long term (current) use of systemic steroids: Secondary | ICD-10-CM | POA: Diagnosis not present

## 2021-11-25 DIAGNOSIS — Z8049 Family history of malignant neoplasm of other genital organs: Secondary | ICD-10-CM | POA: Diagnosis not present

## 2021-11-25 DIAGNOSIS — I4510 Unspecified right bundle-branch block: Secondary | ICD-10-CM | POA: Diagnosis present

## 2021-11-25 DIAGNOSIS — Z9981 Dependence on supplemental oxygen: Secondary | ICD-10-CM | POA: Diagnosis not present

## 2021-11-25 DIAGNOSIS — Z808 Family history of malignant neoplasm of other organs or systems: Secondary | ICD-10-CM | POA: Diagnosis not present

## 2021-11-25 DIAGNOSIS — R778 Other specified abnormalities of plasma proteins: Secondary | ICD-10-CM | POA: Diagnosis present

## 2021-11-25 DIAGNOSIS — I2693 Single subsegmental pulmonary embolism without acute cor pulmonale: Secondary | ICD-10-CM | POA: Diagnosis present

## 2021-11-25 DIAGNOSIS — Z91138 Patient's unintentional underdosing of medication regimen for other reason: Secondary | ICD-10-CM | POA: Diagnosis not present

## 2021-11-25 DIAGNOSIS — I2699 Other pulmonary embolism without acute cor pulmonale: Secondary | ICD-10-CM | POA: Diagnosis present

## 2021-11-25 DIAGNOSIS — Z9114 Patient's other noncompliance with medication regimen: Secondary | ICD-10-CM | POA: Diagnosis not present

## 2021-11-25 DIAGNOSIS — J9621 Acute and chronic respiratory failure with hypoxia: Secondary | ICD-10-CM | POA: Diagnosis present

## 2021-11-25 DIAGNOSIS — J439 Emphysema, unspecified: Secondary | ICD-10-CM | POA: Diagnosis present

## 2021-11-25 DIAGNOSIS — I82401 Acute embolism and thrombosis of unspecified deep veins of right lower extremity: Secondary | ICD-10-CM | POA: Diagnosis not present

## 2021-11-25 DIAGNOSIS — R55 Syncope and collapse: Secondary | ICD-10-CM | POA: Diagnosis present

## 2021-11-25 DIAGNOSIS — I248 Other forms of acute ischemic heart disease: Secondary | ICD-10-CM | POA: Diagnosis present

## 2021-11-25 DIAGNOSIS — I2510 Atherosclerotic heart disease of native coronary artery without angina pectoris: Secondary | ICD-10-CM | POA: Diagnosis present

## 2021-11-25 DIAGNOSIS — J449 Chronic obstructive pulmonary disease, unspecified: Secondary | ICD-10-CM | POA: Diagnosis not present

## 2021-11-25 DIAGNOSIS — Z79899 Other long term (current) drug therapy: Secondary | ICD-10-CM | POA: Diagnosis not present

## 2021-11-25 DIAGNOSIS — Z87891 Personal history of nicotine dependence: Secondary | ICD-10-CM | POA: Diagnosis not present

## 2021-11-25 DIAGNOSIS — I700 Atherosclerosis of aorta: Secondary | ICD-10-CM | POA: Diagnosis present

## 2021-11-25 DIAGNOSIS — D696 Thrombocytopenia, unspecified: Secondary | ICD-10-CM | POA: Diagnosis present

## 2021-11-25 DIAGNOSIS — K219 Gastro-esophageal reflux disease without esophagitis: Secondary | ICD-10-CM | POA: Diagnosis present

## 2021-11-25 LAB — ECHOCARDIOGRAM COMPLETE
AR max vel: 2.36 cm2
AV Peak grad: 5.1 mmHg
Ao pk vel: 1.13 m/s
Area-P 1/2: 4.83 cm2
Calc EF: 63.4 %
Height: 69 in
S' Lateral: 3.55 cm
Single Plane A2C EF: 57.5 %
Single Plane A4C EF: 69.7 %
Weight: 2765.45 oz

## 2021-11-25 LAB — HEPARIN LEVEL (UNFRACTIONATED)
Heparin Unfractionated: 0.98 IU/mL — ABNORMAL HIGH (ref 0.30–0.70)
Heparin Unfractionated: >1.1 IU/mL — ABNORMAL HIGH (ref 0.30–0.70)
Heparin Unfractionated: 1.03 IU/mL — ABNORMAL HIGH (ref 0.30–0.70)

## 2021-11-25 LAB — CBC
HCT: 37.2 % — ABNORMAL LOW (ref 39.0–52.0)
Hemoglobin: 12.8 g/dL — ABNORMAL LOW (ref 13.0–17.0)
MCH: 31.8 pg (ref 26.0–34.0)
MCHC: 34.4 g/dL (ref 30.0–36.0)
MCV: 92.3 fL (ref 80.0–100.0)
Platelets: 111 10*3/uL — ABNORMAL LOW (ref 150–400)
RBC: 4.03 MIL/uL — ABNORMAL LOW (ref 4.22–5.81)
RDW: 14.3 % (ref 11.5–15.5)
WBC: 7.3 10*3/uL (ref 4.0–10.5)
nRBC: 0 % (ref 0.0–0.2)

## 2021-11-25 LAB — LIPID PANEL
Cholesterol: 145 mg/dL (ref 0–200)
HDL: 57 mg/dL (ref >40)
LDL Cholesterol: 77 mg/dL (ref 0–99)
Total CHOL/HDL Ratio: 2.5 RATIO
Triglycerides: 53 mg/dL (ref <150)
VLDL: 11 mg/dL (ref 0–40)

## 2021-11-25 LAB — APTT: aPTT: 118 seconds — ABNORMAL HIGH (ref 24–36)

## 2021-11-25 MED ORDER — METOPROLOL TARTRATE 25 MG PO TABS
12.5000 mg | ORAL_TABLET | Freq: Two times a day (BID) | ORAL | Status: DC
  Administered 2021-11-25 – 2021-11-27 (×5): 12.5 mg via ORAL
  Filled 2021-11-25 (×5): qty 1

## 2021-11-25 MED ORDER — PERFLUTREN LIPID MICROSPHERE
1.0000 mL | INTRAVENOUS | Status: AC | PRN
  Administered 2021-11-25: 2 mL via INTRAVENOUS
  Filled 2021-11-25: qty 10

## 2021-11-25 MED ORDER — METHYLPREDNISOLONE SODIUM SUCC 40 MG IJ SOLR
40.0000 mg | Freq: Two times a day (BID) | INTRAMUSCULAR | Status: DC
  Administered 2021-11-25 (×2): 40 mg via INTRAVENOUS
  Filled 2021-11-25 (×2): qty 1

## 2021-11-25 MED ORDER — HEPARIN (PORCINE) 25000 UT/250ML-% IV SOLN
1050.0000 [IU]/h | INTRAVENOUS | Status: DC
  Administered 2021-11-25: 1200 [IU]/h via INTRAVENOUS
  Filled 2021-11-25: qty 250

## 2021-11-25 MED ORDER — HEPARIN (PORCINE) 25000 UT/250ML-% IV SOLN: 1350.0000 [IU]/h | INTRAVENOUS | Status: DC

## 2021-11-25 MED ORDER — FUROSEMIDE 10 MG/ML IJ SOLN
40.0000 mg | Freq: Once | INTRAMUSCULAR | Status: AC
  Administered 2021-11-25: 40 mg via INTRAVENOUS
  Filled 2021-11-25: qty 4

## 2021-11-25 NOTE — Progress Notes (Signed)
Pt's oxygen increased to 5lpm for pox of 88-90% while sleeping. Pt with rebound pox up to 94-97% with oxygen increase.

## 2021-11-25 NOTE — Consult Note (Addendum)
ANTICOAGULATION CONSULT NOTE  Pharmacy Consult for IV Heparin Indication: pulmonary embolus  Patient Measurements: Height: 5\' 9"  (175.3 cm) Weight: 74.9 kg (165 lb 1.6 oz) IBW/kg (Calculated) : 70.7 Heparin Dosing Weight: 78.4  Labs: Recent Labs    11/24/21 1107 11/24/21 1340 11/24/21 1612 11/24/21 1620 11/24/21 1620 11/24/21 1848 11/25/21 0204 11/25/21 0610 11/25/21 1149 11/25/21 2010  HGB 14.2  --   --   --   --   --   --  12.8*  --   --   HCT 42.7  --   --   --   --   --   --  37.2*  --   --   PLT 122*  --   --   --   --   --   --  111*  --   --   APTT  --   --  27  --   --   --  118*  --   --   --   LABPROT  --   --  13.5  --   --   --   --   --   --   --   INR  --   --  1.0  --   --   --   --   --   --   --   HEPARINUNFRC  --   --   --  <0.10*   < >  --  0.98*  --  >1.10* 1.03*  CREATININE 0.88  --   --   --   --   --   --   --   --   --   TROPONINIHS  --  40*  --  36*  --  33*  --   --   --   --    < > = values in this interval not displayed.     Estimated Creatinine Clearance: 60.3 mL/min (by C-G formula based on SCr of 0.88 mg/dL).   Medical History: Past Medical History:  Diagnosis Date   COPD (chronic obstructive pulmonary disease) (HCC)    COVID-19    GERD (gastroesophageal reflux disease)    SVT (supraventricular tachycardia) (HCC)     Medications:  Patient apparently diagnosed with acute PE on 11/17/21 and started on apixaban, however, per chart review patient was unaware he had a PE and that he was supposed to be taking a blood thinner  Assessment: Patient is an 88 y/o M with medical history including COPD, SVT, recent COVID-19 infection , recent diagnosis of acute PE on 11/17/21 who presented to the ED 1/13 with near syncope. Un-clear at this time if patient was taking Eliquis as prescribed for recently diagnosed PE, but per chart, endorses being unaware of being on a blood thinner. Pharmacy consulted to initiate heparin infusion for PE.  Baseline CBC  acceptable, platelets mildly low at 122.  Baseline aPTT, PT-INR, and heparin level are pending.  Goal of Therapy:  Heparin level 0.3-0.7 units/ml aPTT 66-102 seconds Monitor platelets by anticoagulation protocol: Yes  1/13 1620 HL < 0.10 (Baseline) 1/14 0204 HL 0.98, aPTT 118, supratherapeutic 1/14 1149 HL >1.10     Supratherap   hold then dec to 1350u/hr 1/14 2010 HL 1.03 , supra-therapeutic @ 1350 un/hr  Plan:  --Hold Heparin drip x 1 hour then, resume at  decreased rate of 1200 units/hr --Recheck HL and aPTT 8hrs after heparin restart to reassess correlation and therapeutic level --Daily CBC per  protocol while on IV heparin  Sofya Moustafa Rodriguez-Guzman PharmD, BCPS 11/25/2021 10:00 PM

## 2021-11-25 NOTE — Progress Notes (Signed)
PROGRESS NOTE    Dylan Weber  WUJ:[id number] DOB: 11-15-1932 DOA: 11/24/2021 PCP: [place]    Brief Narrative:  88 y.o. male with medical history significant of COPD, GERD, SVT, recent admission for COVID-19 infection discharged on 2 L oxygen, PE, who presents with near syncope.   Patient was recently hospitalized from 12/20-2012/21 due to COVID-19 infection.  Patient was treated with 1 dose of remdesivir and a course of extra weight and prednisone.  Patient was discharged home on 2 L oxygen. Due to SOB, his pulmonologist, Dr. Lanney Gins ordered CT angiogram on 11/17/2021, which showed small PE.  Patient was given prescription of Eliquis, but patient did not realize this prescription and has not started taking this medication yet.   Pt states that he woke up this morning, feeling flushed and lightheaded. He was in the waiting room and had another episode of feeling flushed in ED. No LOC or fall. He has SOB, denies chest pain, cough, fever or chills.  No nausea vomiting, diarrhea or abdominal pain.  No symptoms of UTI.  No rectal bleeding or dark stool.  No unilateral numbness or tinglings in extremities.  No facial droop or slurred speech.  1/14: Patient overall remained stable.  Has intermittent episodes of flash skin and lightheadedness.  In the ER there is been associated with intermittent tachycardia.  Usually less than a minute and self resolving.  Assessment & Plan:   Principal Problem:   Pulmonary embolism (HCC) Active Problems:   SVT (supraventricular tachycardia) (HCC)   Near syncope   Elevated troponin   Thrombocytopenia (HCC)   Pulmonary emboli (HCC)   Pulmonary embolism (HCC) Right lower extremity DVT Acute on chronic hypoxic respiratory failure : CTA on 11/17/21 showed small peripheral pulmonary embolus in lower lobe branch of right pulmonary artery.  Oxygen saturation 90-93% basal level 2 L oxygen. Venous duplex positive for right lower extremity occlusive  DVT Plan: Continue heparin GTT Follow-up TTE Supplemental oxygen as necessary Monitor vitals and fever curve  SVT (supraventricular tachycardia) ([place]):  Paroxysmal atrial fibrillation Heart rate normally controlled with intermittent episodes of tachycardia lasting less than a minute Not associated with chest pain Self resolving Plan: Continue Cardizem Add metoprolol tartrate 12.5 twice daily, uptitrate as necessary Telemetry monitoring Home Eliquis on hold Cardizem as above TTE as above  Near syncope  Etiology is not clear.   Potential differential diagnosis include enlargement of PE and orthostatic status.   No focal neurodeficit on physical examination, low suspicions for stroke. Possible arrhythmogenic Plan: Follow-up TTE Orthostatic vitals Fall precautions Therapy evaluations   Elevated troponin  Troponin 40.  No chest pain.   Possibly due to demand ischemia secondary to pulmonary embolism Plan: IV heparin as above Aspirin 81 daily Telemetry   COPD: stable -Bronchodilators   Thrombocytopenia (HCC) Platelets 122.  No bleeding tendency. -Follow-up with CBC    DVT prophylaxis: IV heparin Code Status: Full Family Communication: Wife at bedside Disposition Plan: Status is: Inpatient  Remains inpatient appropriate because: Acute PE on IV heparin       Level of care: Telemetry Cardiac  Consultants:  None  Procedures:  None  Antimicrobials: None   Subjective: Seen and examined.  Sitting up in bed.  No visible distress.  Does endorse intermittent palpitations associated with diaphoresis.  No pain complaints  Objective: Vitals:   11/25/21 0530 11/25/21 1100 11/25/21 1200 11/25/21 1230  BP: 127/74 117/72 (!) 120/104 112/73  Pulse: (!) 58 91 93 99  Resp: 15 13 19  20  Temp:      SpO2: 99% 90% 93% 91%  Weight:      Height:        Intake/Output Summary (Last 24 hours) at 11/25/2021 1418 Last data filed at 11/25/2021 1200 Gross per 24 hour   Intake --  Output 2651 ml  Net -2651 ml   Filed Weights   11/24/21 1105  Weight: 78.4 kg    Examination:  General exam: No acute distress Respiratory system: Lungs clear.  Normal work of breathing.  4 L Cardiovascular system: S1-S2, tachycardic, regular rhythm, no murmurs, no pedal edema Gastrointestinal system: Abdomen is nondistended, soft and nontender. No organomegaly or masses felt. Normal bowel sounds heard. Central nervous system: Alert and oriented. No focal neurological deficits. Extremities: Symmetric 5 x 5 power. Skin: No rashes, lesions or ulcers Psychiatry: Judgement and insight appear normal. Mood & affect appropriate.     Data Reviewed: I have personally reviewed following labs and imaging studies  CBC: Recent Labs  Lab 11/24/21 1107 11/25/21 0610  WBC 8.2 7.3  HGB 14.2 12.8*  HCT 42.7 37.2*  MCV 94.3 92.3  PLT 122* 016*   Basic Metabolic Panel: Recent Labs  Lab 11/24/21 1107  NA 137  K 3.8  CL 103  CO2 26  GLUCOSE 101*  BUN 18  CREATININE 0.88  CALCIUM 9.0   GFR: Estimated Creatinine Clearance: 60.3 mL/min (by C-G formula based on SCr of 0.88 mg/dL). Liver Function Tests: No results for input(s): AST, ALT, ALKPHOS, BILITOT, PROT, ALBUMIN in the last 168 hours. No results for input(s): LIPASE, AMYLASE in the last 168 hours. No results for input(s): AMMONIA in the last 168 hours. Coagulation Profile: Recent Labs  Lab 11/24/21 1612  INR 1.0   Cardiac Enzymes: No results for input(s): CKTOTAL, CKMB, CKMBINDEX, TROPONINI in the last 168 hours. BNP (last 3 results) No results for input(s): PROBNP in the last 8760 hours. HbA1C: Recent Labs    11/24/21 1612  HGBA1C 5.8*   CBG: No results for input(s): GLUCAP in the last 168 hours. Lipid Profile: Recent Labs    11/25/21 0610  CHOL 145  HDL 57  LDLCALC 77  TRIG 53  CHOLHDL 2.5   Thyroid Function Tests: No results for input(s): TSH, T4TOTAL, FREET4, T3FREE, THYROIDAB in the  last 72 hours. Anemia Panel: No results for input(s): VITAMINB12, FOLATE, FERRITIN, TIBC, IRON, RETICCTPCT in the last 72 hours. Sepsis Labs: No results for input(s): PROCALCITON, LATICACIDVEN in the last 168 hours.  No results found for this or any previous visit (from the past 240 hour(s)).       Radiology Studies: US Venous Img Lower Bilateral (DVT)  Result Date: 11/24/2021 CLINICAL DATA:  Recent pulmonary embolus. EXAM: Bilateral LOWER EXTREMITY VENOUS DOPPLER ULTRASOUND TECHNIQUE: Gray-scale sonography with compression, as well as color and duplex ultrasound, were performed to evaluate the deep venous system(s) from the level of the common femoral vein through the popliteal and proximal calf veins. COMPARISON:  None. FINDINGS: VENOUS Normal compressibility of the common femoral, superficial femoral, and popliteal veins. Visualized portions of profunda femoral vein and great saphenous vein unremarkable. Doppler waveforms in these veins show normal direction of venous flow, normal respiratory plasticity and response to augmentation. Occlusive thrombus is present in the right peroneal vein. Left peroneal vein unremarkable. OTHER None. Limitations: none IMPRESSION: Occlusive right calf vein DVT in the right peroneal vein. No other DVT identified. Electronically Signed   By: Van Clines M.D.   On: 11/24/2021 17:18  Scheduled Meds:  ascorbic acid  500 mg Oral Daily   aspirin EC  81 mg Oral Daily   diltiazem  120 mg Oral Daily   fluticasone furoate-vilanterol  1 puff Inhalation Daily   methylPREDNISolone (SOLU-MEDROL) injection  40 mg Intravenous Q12H   metoprolol tartrate  12.5 mg Oral BID   multivitamin with minerals  1 tablet Oral Daily   pantoprazole  40 mg Oral Daily   zinc sulfate  220 mg Oral Daily   Continuous Infusions:  heparin       LOS: 0 days    Time spent: 50 minutes    Sidney Ace, MD [facility]   If 7PM-7AM, please contact  night-coverage  11/25/2021, 2:18 PM

## 2021-11-25 NOTE — Consult Note (Signed)
[place] for IV Heparin Indication: pulmonary embolus  Patient Measurements: Height: 5\' 9"  (175.3 cm) Weight: 78.4 kg (172 lb 13.5 oz) IBW/kg (Calculated) : 70.7 Heparin Dosing Weight: 78.4  Labs: Recent Labs    11/24/21 1107 11/24/21 1340 11/24/21 1612 11/24/21 1620 11/24/21 1848 11/25/21 0204  HGB 14.2  --   --   --   --   --   HCT 42.7  --   --   --   --   --   PLT 122*  --   --   --   --   --   APTT  --   --  27  --   --  118*  LABPROT  --   --  13.5  --   --   --   INR  --   --  1.0  --   --   --   HEPARINUNFRC  --   --   --  <0.10*  --  0.98*  CREATININE 0.88  --   --   --   --   --   TROPONINIHS  --  40*  --  36* 33*  --      Estimated Creatinine Clearance: 60.3 mL/min (by C-G formula based on SCr of 0.88 mg/dL).   Medical History: Past Medical History:  Diagnosis Date   COPD (chronic obstructive pulmonary disease) (HCC)    COVID-19    GERD (gastroesophageal reflux disease)    SVT (supraventricular tachycardia) (HCC)     Medications:  Patient apparently diagnosed with acute PE on 11/17/21 and started on apixaban, however, per chart review patient was unaware he had a PE and that he was supposed to be taking a blood thinner  Assessment: Patient is an 88 y/o M with medical history including COPD, SVT, recent COVID-19 infection , recent diagnosis of acute PE on 11/17/21 who presented to the ED 1/13 with near syncope. Un-clear at this time if patient was taking Eliquis as prescribed for recently diagnosed PE, but per chart, endorses being unaware of being on a blood thinner. Pharmacy consulted to initiate heparin infusion for PE.  Baseline CBC acceptable, platelets mildly low at 122. Baseline aPTT, PT-INR, and heparin level are pending.  Goal of Therapy:  Heparin level 0.3-0.7 units/ml aPTT 66-102 seconds Monitor platelets by anticoagulation protocol: Yes  1/13 1620 HL < 0.10 (Baseline) 1/14 0204 HL 0.98, aPTT 118,  supratherapeutic  Plan:  --Decrease heparin infusion to 1500 units/hr --Recheck HL 8 hrs after rate change  --Daily CBC per protocol while on IV heparin  Renda Rolls, PharmD, [facility] 11/25/2021 3:05 AM

## 2021-11-25 NOTE — Progress Notes (Signed)
OT Cancellation Note  Patient Details Name: Dylan Weber MRN: [id number] DOB: October 16, 1933   Cancelled Treatment:    Reason Eval/Treat Not Completed: Medical issues which prohibited therapy OT consult received and chart reviewed. Pt noted to have PE & currently being on heparin drip. Per protocol will hold OT evaluation following initiation of anticoagulation. Pt started on heparin drip on 11/24/21 at 1812 (6:12 PM) & will have completed 48 hrs on 11/26/21 at 6:12 PM. MD made aware of protocol. Will hold PT evaluation at this time & f/u when able/pt is appropriate.  Gerrianne Scale, [place], OTR/L ascom [phone] 11/25/21, 1:56 PM

## 2021-11-25 NOTE — Consult Note (Signed)
[place] for IV Heparin Indication: pulmonary embolus  Patient Measurements: Height: 5\' 9"  (175.3 cm) Weight: 78.4 kg (172 lb 13.5 oz) IBW/kg (Calculated) : 70.7 Heparin Dosing Weight: 78.4  Labs: Recent Labs    11/24/21 1107 11/24/21 1340 11/24/21 1612 11/24/21 1620 11/24/21 1848 11/25/21 0204 11/25/21 0610 11/25/21 1149  HGB 14.2  --   --   --   --   --  12.8*  --   HCT 42.7  --   --   --   --   --  37.2*  --   PLT 122*  --   --   --   --   --  111*  --   APTT  --   --  27  --   --  118*  --   --   LABPROT  --   --  13.5  --   --   --   --   --   INR  --   --  1.0  --   --   --   --   --   HEPARINUNFRC  --   --   --  <0.10*  --  0.98*  --  >1.10*  CREATININE 0.88  --   --   --   --   --   --   --   TROPONINIHS  --  40*  --  36* 33*  --   --   --      Estimated Creatinine Clearance: 60.3 mL/min (by C-G formula based on SCr of 0.88 mg/dL).   Medical History: Past Medical History:  Diagnosis Date   COPD (chronic obstructive pulmonary disease) (HCC)    COVID-19    GERD (gastroesophageal reflux disease)    SVT (supraventricular tachycardia) (HCC)     Medications:  Patient apparently diagnosed with acute PE on 11/17/21 and started on apixaban, however, per chart review patient was unaware he had a PE and that he was supposed to be taking a blood thinner  Assessment: Patient is an 88 y/o M with medical history including COPD, SVT, recent COVID-19 infection , recent diagnosis of acute PE on 11/17/21 who presented to the ED 1/13 with near syncope. Un-clear at this time if patient was taking Eliquis as prescribed for recently diagnosed PE, but per chart, endorses being unaware of being on a blood thinner. Pharmacy consulted to initiate heparin infusion for PE.  Baseline CBC acceptable, platelets mildly low at 122. Baseline aPTT, PT-INR, and heparin level are pending.  Goal of Therapy:  Heparin level 0.3-0.7 units/ml aPTT 66-102  seconds Monitor platelets by anticoagulation protocol: Yes  1/13 1620 HL < 0.10 (Baseline) 1/14 0204 HL 0.98, aPTT 118, supratherapeutic 1/14 1149 HL >1.10     Supratherap   hold then dec to 1350u/hr  Plan:  -Hold Heparin drip x 1 hour then --Decrease heparin infusion to 1350 units/hr --Recheck HL at 2000 --Daily CBC per protocol while on IV heparin  Chinita Greenland PharmD Clinical Pharmacist 11/25/2021

## 2021-11-25 NOTE — Plan of Care (Signed)
Patient has been having 1-2 minute bursts of 140-160 HR.  Complains of SOB, pale, diaphoretic  and feeling of "gonna pass out." (TTL of 3x in past few hours.)  Dr. Ree Kida and rounded again to assess.  Adding beta blockers to assist and stated if begins to sustain a bit more - may need consider cardiac drip to assist.

## 2021-11-25 NOTE — Progress Notes (Signed)
PT Cancellation Note  Patient Details Name: Dylan Weber MRN: [id number] DOB: 05-29-1933   Cancelled Treatment:    Reason Eval/Treat Not Completed: Patient not medically ready PT orders received, chart reviewed. Pt noted to have PE & currently being on heparin drip. Per protocol will hold PT evaluation following initiation of anticoagulation. Pt started on heparin drip on 11/24/21 at 1812 (6:12 PM) & will have completed 48 hrs on 11/26/21 at 6:12 PM. MD made aware of protocol. Will hold PT evaluation at this time & f/u when able/pt is appropriate.  Lavone Nian, PT, DPT 11/25/21, 8:25 AM   [name] 11/25/2021, 8:23 AM

## 2021-11-25 NOTE — Progress Notes (Signed)
*  PRELIMINARY RESULTS* Echocardiogram 2D Echocardiogram has been performed. Definity IV Contrast used on this study.  Dylan Weber 11/25/2021, 12:40 PM

## 2021-11-25 NOTE — ED Notes (Signed)
Lab notified of Heparin level draw.

## 2021-11-26 DIAGNOSIS — I2699 Other pulmonary embolism without acute cor pulmonale: Secondary | ICD-10-CM | POA: Diagnosis not present

## 2021-11-26 DIAGNOSIS — J449 Chronic obstructive pulmonary disease, unspecified: Secondary | ICD-10-CM

## 2021-11-26 LAB — CBC
HCT: 42.1 % (ref 39.0–52.0)
Hemoglobin: 14.1 g/dL (ref 13.0–17.0)
MCH: 31.1 pg (ref 26.0–34.0)
MCHC: 33.5 g/dL (ref 30.0–36.0)
MCV: 92.9 fL (ref 80.0–100.0)
Platelets: 133 10*3/uL — ABNORMAL LOW (ref 150–400)
RBC: 4.53 MIL/uL (ref 4.22–5.81)
RDW: 14.2 % (ref 11.5–15.5)
WBC: 6 10*3/uL (ref 4.0–10.5)
nRBC: 0 % (ref 0.0–0.2)

## 2021-11-26 LAB — APTT: aPTT: 96 seconds — ABNORMAL HIGH (ref 24–36)

## 2021-11-26 LAB — HEPARIN LEVEL (UNFRACTIONATED): Heparin Unfractionated: 0.99 IU/mL — ABNORMAL HIGH (ref 0.30–0.70)

## 2021-11-26 MED ORDER — APIXABAN 5 MG PO TABS: 5.0000 mg | ORAL_TABLET | Freq: Two times a day (BID) | ORAL | Status: DC

## 2021-11-26 MED ORDER — APIXABAN 5 MG PO TABS
10.0000 mg | ORAL_TABLET | Freq: Two times a day (BID) | ORAL | Status: DC
  Administered 2021-11-26 – 2021-11-27 (×3): 10 mg via ORAL
  Filled 2021-11-26 (×3): qty 2

## 2021-11-26 MED ORDER — METHYLPREDNISOLONE SODIUM SUCC 40 MG IJ SOLR: 40.0000 mg | Freq: Every day | INTRAMUSCULAR | Status: DC

## 2021-11-26 MED ORDER — DILTIAZEM HCL ER COATED BEADS 180 MG PO CP24
180.0000 mg | ORAL_CAPSULE | Freq: Every day | ORAL | Status: DC
  Administered 2021-11-26 – 2021-11-27 (×2): 180 mg via ORAL
  Filled 2021-11-26 (×3): qty 1

## 2021-11-26 NOTE — Consult Note (Signed)
[place] for transition of IV Heparin to apixaban Indication: pulmonary embolus  Patient Measurements: Height: 5\' 9"  (175.3 cm) Weight: 74.9 kg (165 lb 1.6 oz) IBW/kg (Calculated) : 70.7  Labs: Recent Labs    11/24/21 1107 11/24/21 1340 11/24/21 1612 11/24/21 1620 11/24/21 1620 11/24/21 1848 11/25/21 0204 11/25/21 0610 11/25/21 1149 11/25/21 2010 11/26/21 0517 11/26/21 0649  HGB 14.2  --   --   --   --   --   --  12.8*  --   --  14.1  --   HCT 42.7  --   --   --   --   --   --  37.2*  --   --  42.1  --   PLT 122*  --   --   --   --   --   --  111*  --   --  133*  --   APTT  --   --  27  --   --   --  118*  --   --   --   --  96*  LABPROT  --   --  13.5  --   --   --   --   --   --   --   --   --   INR  --   --  1.0  --   --   --   --   --   --   --   --   --   HEPARINUNFRC  --   --   --  <0.10*   < >  --  0.98*  --  >1.10* 1.03*  --  0.99*  CREATININE 0.88  --   --   --   --   --   --   --   --   --   --   --   TROPONINIHS  --  40*  --  36*  --  33*  --   --   --   --   --   --    < > = values in this interval not displayed.     Estimated Creatinine Clearance: 60.3 mL/min (by C-G formula based on SCr of 0.88 mg/dL).   Medical History: Past Medical History:  Diagnosis Date   COPD (chronic obstructive pulmonary disease) (HCC)    COVID-19    GERD (gastroesophageal reflux disease)    SVT (supraventricular tachycardia) (HCC)     Medications:  Patient apparently diagnosed with acute PE on 11/17/21 and started on apixaban, however, per chart review patient was unaware he had a PE and that he was supposed to be taking a blood thinner  Assessment: Patient is an 88 y/o M with medical history including COPD, SVT, recent COVID-19 infection , recent diagnosis of acute PE on 11/17/21 who presented to the ED 1/13 with near syncope. Un-clear at this time if patient was taking Eliquis as prescribed for recently diagnosed PE, but per chart,  endorses being unaware of being on a blood thinner. Pharmacy consulted to transition from heparin infusion to apixaban for PE.  Goal of Therapy:  Monitor platelets by anticoagulation protocol: Yes  Plan:  Stop heparin infusion Start apixaban 10 mg twice daily for 7 days followed by 5 mg twice daily Follow CBC per protocol   Vallery Sa, PharmD Clinical Pharmacist 11/26/2021

## 2021-11-26 NOTE — Progress Notes (Signed)
PROGRESS NOTE    Dylan Weber  PJA:[id number] DOB: 12-07-1932 DOA: 11/24/2021 PCP: [place]    Brief Narrative:  88 y.o. male with medical history significant of COPD, GERD, SVT, recent admission for COVID-19 infection discharged on 2 L oxygen, PE, who presents with near syncope.   Patient was recently hospitalized from 12/20-2012/21 due to COVID-19 infection.  Patient was treated with 1 dose of remdesivir and a course of extra weight and prednisone.  Patient was discharged home on 2 L oxygen. Due to SOB, his pulmonologist, Dr. Lanney Gins ordered CT angiogram on 11/17/2021, which showed small PE.  Patient was given prescription of Eliquis, but patient did not realize this prescription and has not started taking this medication yet.   Pt states that he woke up this morning, feeling flushed and lightheaded. He was in the waiting room and had another episode of feeling flushed in ED. No LOC or fall. He has SOB, denies chest pain, cough, fever or chills.  No nausea vomiting, diarrhea or abdominal pain.  No symptoms of UTI.  No rectal bleeding or dark stool.  No unilateral numbness or tinglings in extremities.  No facial droop or slurred speech.  1/14: Patient overall remained stable.  Has intermittent episodes of flash skin and lightheadedness.  In the ER there is been associated with intermittent tachycardia.  Usually less than a minute and self resolving.  Assessment & Plan:   Principal Problem:   Pulmonary embolism (HCC) Active Problems:   SVT (supraventricular tachycardia) (HCC)   Near syncope   Elevated troponin   Thrombocytopenia (HCC)   Pulmonary emboli (HCC)   Pulmonary embolism (HCC) Right lower extremity DVT Acute on chronic hypoxic respiratory failure CTA on 11/17/21 showed small peripheral pulmonary embolus in lower lobe branch of right pulmonary artery.  Oxygen saturation 90-93% basal level 2 L oxygen. Venous duplex positive for right lower extremity occlusive  DVT Echocardiogram with normal EF, grade 1 diastolic dysfunction Plan: DC heparin gtt. Start Eliquis with pharmacy dosing, 10 mg twice daily x7 days, followed by 5 mg twice daily Supplemental oxygen as necessary, wean as tolerated.  Currently on home or 2 L Therapy evaluations Anticipate discharge 1/1  SVT (supraventricular tachycardia) ([place]):  Heart rate normally controlled with intermittent episodes of tachycardia lasting less than a minute Not associated with chest pain Self resolving Unclear if patient truly has atrial fibrillation Plan: Continue Cardizem CD per home dose Continue metoprolol tartrate 12.5 mg twice daily, titrate as necessary Telemetry monitoring  Eliquis as above Cardiology consulted for recommendations  Near syncope  Etiology is not clear.   Potential differential diagnosis include enlargement of PE and orthostatic status.   Arrhythmogenic syncope also on differential No focal neurodeficit on physical examination, low suspicions for stroke. Plan: Orthostatic vitals Fall precautions Therapy evaluations.    Elevated troponin  Troponin 40.  No chest pain.   Possibly due to demand ischemia secondary to pulmonary embolism Plan: P.o. Eliquis as above Telemetry   COPD: stable -Bronchodilators   Thrombocytopenia (HCC) Chronically thrombocytopenic No evidence of bleeding     DVT prophylaxis: Eliquis Code Status: Full Family Communication: Wife at bedside 1/14 Disposition Plan: Status is: Inpatient  Remains inpatient appropriate because: Acute PE on IV heparin       Level of care: Telemetry Cardiac  Consultants:  None  Procedures:  None  Antimicrobials: None   Subjective: Seen and examined.  Feeling well overall.  No visible distress.  Last episode of palpitations occurred when patient was  transported out of the emergency room.  None since.  Objective: Vitals:   11/25/21 2331 11/26/21 0447 11/26/21 0804 11/26/21 1149  BP:  102/67 108/72 119/87 107/66  Pulse: (!) 58 65 79 76  Resp: 19 20 18 18   Temp: 97.9 F (36.6 C) 98.4 F (36.9 C) 97.9 F (36.6 C) (!) 97.4 F (36.3 C)  TempSrc: Oral Oral    SpO2: 96% 100% 90% 94%  Weight:      Height:        Intake/Output Summary (Last 24 hours) at 11/26/2021 1155 Last data filed at 11/26/2021 0900 Gross per 24 hour  Intake 338.46 ml  Output 1700 ml  Net -1361.54 ml   Filed Weights   11/24/21 1105 11/25/21 2000  Weight: 78.4 kg 74.9 kg    Examination:  General exam: No acute distress Respiratory system: Lungs clear.  Normal work of breathing.  2 L Cardiovascular system: S1-S2, tachycardic, regular rhythm, no murmurs, no pedal edema Gastrointestinal system: Soft, NT/ND, normal bowel sounds Central nervous system: Alert and oriented. No focal neurological deficits. Extremities: Symmetric 5 x 5 power. Skin: No rashes, lesions or ulcers Psychiatry: Judgement and insight appear normal. Mood & affect appropriate.     Data Reviewed: I have personally reviewed following labs and imaging studies  CBC: Recent Labs  Lab 11/24/21 1107 11/25/21 0610 11/26/21 0517  WBC 8.2 7.3 6.0  HGB 14.2 12.8* 14.1  HCT 42.7 37.2* 42.1  MCV 94.3 92.3 92.9  PLT 122* 111* 865*   Basic Metabolic Panel: Recent Labs  Lab 11/24/21 1107  NA 137  K 3.8  CL 103  CO2 26  GLUCOSE 101*  BUN 18  CREATININE 0.88  CALCIUM 9.0   GFR: Estimated Creatinine Clearance: 60.3 mL/min (by C-G formula based on SCr of 0.88 mg/dL). Liver Function Tests: No results for input(s): AST, ALT, ALKPHOS, BILITOT, PROT, ALBUMIN in the last 168 hours. No results for input(s): LIPASE, AMYLASE in the last 168 hours. No results for input(s): AMMONIA in the last 168 hours. Coagulation Profile: Recent Labs  Lab 11/24/21 1612  INR 1.0   Cardiac Enzymes: No results for input(s): CKTOTAL, CKMB, CKMBINDEX, TROPONINI in the last 168 hours. BNP (last 3 results) No results for input(s): PROBNP in  the last 8760 hours. HbA1C: Recent Labs    11/24/21 1612  HGBA1C 5.8*   CBG: No results for input(s): GLUCAP in the last 168 hours. Lipid Profile: Recent Labs    11/25/21 0610  CHOL 145  HDL 57  LDLCALC 77  TRIG 53  CHOLHDL 2.5   Thyroid Function Tests: No results for input(s): TSH, T4TOTAL, FREET4, T3FREE, THYROIDAB in the last 72 hours. Anemia Panel: No results for input(s): VITAMINB12, FOLATE, FERRITIN, TIBC, IRON, RETICCTPCT in the last 72 hours. Sepsis Labs: No results for input(s): PROCALCITON, LATICACIDVEN in the last 168 hours.  No results found for this or any previous visit (from the past 240 hour(s)).       Radiology Studies: US Venous Img Lower Bilateral (DVT)  Result Date: 11/24/2021 CLINICAL DATA:  Recent pulmonary embolus. EXAM: Bilateral LOWER EXTREMITY VENOUS DOPPLER ULTRASOUND TECHNIQUE: Gray-scale sonography with compression, as well as color and duplex ultrasound, were performed to evaluate the deep venous system(s) from the level of the common femoral vein through the popliteal and proximal calf veins. COMPARISON:  None. FINDINGS: VENOUS Normal compressibility of the common femoral, superficial femoral, and popliteal veins. Visualized portions of profunda femoral vein and great saphenous vein unremarkable. Doppler waveforms in  these veins show normal direction of venous flow, normal respiratory plasticity and response to augmentation. Occlusive thrombus is present in the right peroneal vein. Left peroneal vein unremarkable. OTHER None. Limitations: none IMPRESSION: Occlusive right calf vein DVT in the right peroneal vein. No other DVT identified. Electronically Signed   By: Van Clines M.D.   On: 11/24/2021 17:18   ECHOCARDIOGRAM COMPLETE  Result Date: 11/25/2021    ECHOCARDIOGRAM REPORT   Patient Name:   JOSEEDUARDO BRIX Date of Exam: 11/25/2021 Medical Rec #:  [id number]     Height:       69.0 in Accession #:    [id number]    Weight:       172.8 lb Date  of Birth:  [birth year]     BSA:          1.942 m Patient Age:    59 years      BP:           127/74 mmHg Patient Gender: M             HR:           90 bpm. Exam Location:  [facility] Procedure: 2D Echo and Intracardiac Opacification Agent Indications:     Pulmonary Embolus I26.09  History:         Patient has no prior history of Echocardiogram examinations.  Sonographer:     Kathlen Brunswick RDCS Referring Phys:  [id number] Soledad Gerlach NIU Diagnosing Phys: Kate Sable MD  Sonographer Comments: Technically difficult study due to poor echo windows and suboptimal subcostal window. Image acquisition challenging due to patient body habitus. IMPRESSIONS  1. Left ventricular ejection fraction, by estimation, is 55 to 60%. The left ventricle has normal function. The left ventricle has no regional wall motion abnormalities. Left ventricular diastolic parameters are consistent with Grade I diastolic dysfunction (impaired relaxation).  2. Right ventricular systolic function is normal. The right ventricular size is not well visualized.  3. The mitral valve is grossly normal. No evidence of mitral valve regurgitation.  4. The aortic valve was not well visualized. Aortic valve regurgitation is mild. FINDINGS  Left Ventricle: Left ventricular ejection fraction, by estimation, is 55 to 60%. The left ventricle has normal function. The left ventricle has no regional wall motion abnormalities. Definity contrast agent was given IV to delineate the left ventricular  endocardial borders. The left ventricular internal cavity size was normal in size. There is no left ventricular hypertrophy. Left ventricular diastolic parameters are consistent with Grade I diastolic dysfunction (impaired relaxation). Right Ventricle: The right ventricular size is not well visualized. No increase in right ventricular wall thickness. Right ventricular systolic function is normal. Left Atrium: Left atrial size was normal in size. Right Atrium: Right atrial size was not  well visualized. Pericardium: There is no evidence of pericardial effusion. Mitral Valve: The mitral valve is grossly normal. No evidence of mitral valve regurgitation. Tricuspid Valve: The tricuspid valve is normal in structure. Tricuspid valve regurgitation is not demonstrated. Aortic Valve: The aortic valve was not well visualized. Aortic valve regurgitation is mild. Aortic valve peak gradient measures 5.1 mmHg. Pulmonic Valve: The pulmonic valve was not well visualized. Pulmonic valve regurgitation is not visualized. Aorta: The aortic root is normal in size and structure. Venous: The inferior vena cava was not well visualized. IAS/Shunts: The interatrial septum was not well visualized.  LEFT VENTRICLE PLAX 2D LVIDd:         5.15 cm     Diastology LVIDs:  3.55 cm     LV e' medial:    3.92 cm/s LV PW:         0.95 cm     LV E/e' medial:  11.4 LV IVS:        0.85 cm     LV e' lateral:   7.40 cm/s LVOT diam:     2.10 cm     LV E/e' lateral: 6.0 LV SV:         37 LV SV Index:   19 LVOT Area:     3.46 cm  LV Volumes (MOD) LV vol d, MOD A2C: 50.3 ml LV vol d, MOD A4C: 77.2 ml LV vol s, MOD A2C: 21.4 ml LV vol s, MOD A4C: 23.4 ml LV SV MOD A2C:     28.9 ml LV SV MOD A4C:     77.2 ml LV SV MOD BP:      39.5 ml LEFT ATRIUM           Index LA diam:      3.70 cm 1.91 cm/m LA Vol (A4C): 24.8 ml 12.77 ml/m  AORTIC VALVE AV Area (Vmax): 2.36 cm AV Vmax:        113.00 cm/s AV Peak Grad:   5.1 mmHg LVOT Vmax:      77.00 cm/s LVOT Vmean:     45.500 cm/s LVOT VTI:       0.108 m  AORTA Ao Root diam: 3.40 cm MITRAL VALVE MV Area (PHT): 4.83 cm    SHUNTS MV Decel Time: 157 msec    Systemic VTI:  0.11 m MV E velocity: 44.60 cm/s  Systemic Diam: 2.10 cm MV A velocity: 77.10 cm/s MV E/A ratio:  0.58 Kate Sable MD Electronically signed by Kate Sable MD Signature Date/Time: 11/25/2021/3:02:24 PM    Final         Scheduled Meds:  apixaban  10 mg Oral BID   Followed by   Derrill Memo ON 12/03/2021] apixaban  5 mg  Oral BID   ascorbic acid  500 mg Oral Daily   aspirin EC  81 mg Oral Daily   diltiazem  180 mg Oral Daily   fluticasone furoate-vilanterol  1 puff Inhalation Daily   [START ON 11/27/2021] methylPREDNISolone (SOLU-MEDROL) injection  40 mg Intravenous Daily   metoprolol tartrate  12.5 mg Oral BID   multivitamin with minerals  1 tablet Oral Daily   pantoprazole  40 mg Oral Daily   zinc sulfate  220 mg Oral Daily   Continuous Infusions:     LOS: 1 day    Time spent: 35 minutes    Sidney Ace, MD [facility]   If 7PM-7AM, please contact night-coverage  11/26/2021, 11:55 AM

## 2021-11-26 NOTE — Progress Notes (Signed)
OT Cancellation Note  Patient Details Name: Edge Mauger MRN: [id number] DOB: 1932-11-25   Cancelled Treatment:    Reason Eval/Treat Not Completed: OT screened, no needs identified, will sign off. Order received, chart reviewed. Upon arrival to room, pt awake and sitting upright in recliner following PT evaluation. Pt reporting that he walked 381ft with PT and that he independently performed ADL tasks this date. Pt & pt's wife reporting that pt is performing ADLs/functional mobility at baseline functional independence. No skilled OT needs identified. Will sign off. Please re-consult if additional needs arise.    Fredirick Maudlin, OTR/L [place]

## 2021-11-26 NOTE — Consult Note (Signed)
Cardiology Consultation:   Patient ID: Dylan Weber MRN: [id number]; DOB: 05/28/1933  Admit date: 11/24/2021 Date of Consult: 11/26/2021  PCP:  [facility] Cardiologist:  Ida Rogue, MD        Patient Profile:   Dylan Weber is a 88 y.o. male with a hx of COPD, paroxysmal SVT who is being seen 11/26/2021 for the evaluation of SVT at the request of Dr. Priscella Mann.  History of Present Illness:   Dylan Weber is an 88 year old male with history of paroxysmal SVT, PE, DVT COPD on 2 L home oxygen, former smoker x50+ years, presenting to the hospital due to feeling faint, presyncope.  Patient states feeling weak 2 days ago, also felt like he was going to pass out.  Was admitted last month with hypoxia, discharge on home oxygen.  Paroxysmal SVT was noted during last hospital admission, he was started on Cardizem 120 mg daily.  Patient followed up with pulmonary medicine as outpatient, chest CT with contrast was obtained, results showed pulmonary embolus.  Upon admission, due to CT showing PE, lower extremity ultrasound was obtained showing DVT in the right lower extremity.  While in the ED, patient had runs of SVT associated with feeling flushed.  Telemetry showed runs of SVT.  Lopressor 12.5 mg twice daily was added to patient's regimen.  EKG on admission showed normal sinus rhythm, incomplete right bundle branch block.  Patient states feeling much better compared to admission.  Started on Eliquis for PE treatment.   Past Medical History:  Diagnosis Date   COPD (chronic obstructive pulmonary disease) ([place])    COVID-19    GERD (gastroesophageal reflux disease)    SVT (supraventricular tachycardia) (H[facility])     Past Surgical History:  Procedure Laterality Date   APPENDECTOMY     CHOLECYSTECTOMY       Home Medications:  Prior to Admission medications   Medication Sig Start Date End Date Taking? Authorizing Provider  ascorbic acid (VITAMIN C) 500  MG tablet Take 1 tablet (500 mg total) by mouth daily. 11/02/21  Yes Wieting, Richard, MD  BREO ELLIPTA 200-25 MCG/ACT AEPB Inhale 1 puff into the lungs daily. 10/12/21  Yes [provider]  diltiazem (CARDIZEM CD) 120 MG 24 hr capsule Take 1 capsule (120 mg total) by mouth daily. 11/01/21  Yes Theora Gianotti, NP  Multiple Vitamin (MULTIVITAMIN) capsule Take 1 capsule by mouth daily.   Yes [provider]  pantoprazole (PROTONIX) 40 MG tablet Take 40 mg by mouth daily. 09/20/21  Yes [provider]  zinc sulfate 220 (50 Zn) MG capsule Take 1 capsule (220 mg total) by mouth daily. 11/02/21  Yes Wieting, Richard, MD  acetaminophen (TYLENOL) 650 MG CR tablet Take 650 mg by mouth as needed for pain.    [provider]  albuterol (VENTOLIN HFA) 108 (90 Base) MCG/ACT inhaler Inhale 2 puffs into the lungs every 6 (six) hours as needed for wheezing or shortness of breath. 11/01/21   Loletha Grayer, MD  apixaban (ELIQUIS) 2.5 MG TABS tablet Take 1 tablet (2.5 mg total) by mouth 2 (two) times daily. Patient not taking: Reported on 11/24/2021 11/17/21 02/15/22  Ottie Glazier, MD  guaiFENesin (MUCINEX) 600 MG 12 hr tablet Take 600 mg by mouth daily.    [provider]  predniSONE (DELTASONE) 10 MG tablet 4 tabs po day1,2; 3 tabs po day3,4; 2 tabs po day5,6; 1 tab po day 7,8; 1/2 tab po day9,10 Patient not taking: Reported  on 11/24/2021 11/01/21   Loletha Grayer, MD  pseudoephedrine-acetaminophen (TYLENOL SINUS) 30-500 MG TABS tablet Take 1 tablet by mouth as needed.    [provider]    Inpatient Medications: Scheduled Meds:  apixaban  10 mg Oral BID   Followed by   Derrill Memo ON 12/03/2021] apixaban  5 mg Oral BID   ascorbic acid  500 mg Oral Daily   diltiazem  180 mg Oral Daily   fluticasone furoate-vilanterol  1 puff Inhalation Daily   [START ON 11/27/2021] methylPREDNISolone (SOLU-MEDROL) injection  40 mg Intravenous Daily   metoprolol  tartrate  12.5 mg Oral BID   multivitamin with minerals  1 tablet Oral Daily   pantoprazole  40 mg Oral Daily   zinc sulfate  220 mg Oral Daily   Continuous Infusions:  PRN Meds: acetaminophen, albuterol, dextromethorphan-guaiFENesin, hydrALAZINE, ondansetron (ZOFRAN) IV, traZODone  Allergies:   No Known Allergies  Social History:   Social History   Socioeconomic History   Marital status: Married    Spouse name: Not on file   Number of children: Not on file   Years of education: Not on file   Highest education level: Not on file  Occupational History   Not on file  Tobacco Use   Smoking status: Former    Types: Cigarettes   Smokeless tobacco: Never  Vaping Use   Vaping Use: Never used  Substance and Sexual Activity   Alcohol use: Not Currently   Drug use: Never   Sexual activity: Not on file  Other Topics Concern   Not on file  Social History Narrative   Not on file   Social Determinants of Health   Financial Resource Strain: Not on file  Food Insecurity: Not on file  Transportation Needs: Not on file  Physical Activity: Not on file  Stress: Not on file  Social Connections: Not on file  Intimate Partner Violence: Not on file    Family History:    Family History  Problem Relation Age of Onset   Uterine cancer Mother    Brain cancer Sister      ROS:  Please see the history of present illness.   All other ROS reviewed and negative.     Physical Exam/Data:   Vitals:   11/25/21 2331 11/26/21 0447 11/26/21 0804 11/26/21 1149  BP: 102/67 108/72 119/87 107/66  Pulse: (!) 58 65 79 76  Resp: 19 20 18 18   Temp: 97.9 F (36.6 C) 98.4 F (36.9 C) 97.9 F (36.6 C) (!) 97.4 F (36.3 C)  TempSrc: Oral Oral    SpO2: 96% 100% 90% 94%  Weight:      Height:        Intake/Output Summary (Last 24 hours) at 11/26/2021 1240 Last data filed at 11/26/2021 0900 Gross per 24 hour  Intake 338.46 ml  Output 1200 ml  Net -861.54 ml   Last 3 Weights 11/25/2021  11/24/2021 10/31/2021  Weight (lbs) 165 lb 1.6 oz 172 lb 13.5 oz 173 lb  Weight (kg) 74.889 kg 78.4 kg 78.472 kg     Body mass index is 24.38 kg/m.  General:  Well nourished, well developed, in no acute distress HEENT: normal Neck: no JVD Vascular: No carotid bruits; Distal pulses 2+ bilaterally Cardiac:  normal S1, S2; RRR; no murmur  Lungs: Decreased breath sounds bilaterally Abd: soft, nontender, no hepatomegaly  Ext: no edema Musculoskeletal:  No deformities, BUE and BLE strength normal and equal Skin: warm and dry  Neuro:  CNs  2-12 intact, no focal abnormalities noted Psych:  Normal affect   EKG:  The EKG was personally reviewed and demonstrates: Sinus rhythm Telemetry:  Telemetry was personally reviewed and demonstrates: Sinus rhythm, PACs  Relevant CV Studies: TTE 11/25/2021 1. Left ventricular ejection fraction, by estimation, is 55 to 60%. The  left ventricle has normal function. The left ventricle has no regional  wall motion abnormalities. Left ventricular diastolic parameters are  consistent with Grade I diastolic  dysfunction (impaired relaxation).   2. Right ventricular systolic function is normal. The right ventricular  size is not well visualized.   3. The mitral valve is grossly normal. No evidence of mitral valve  regurgitation.   4. The aortic valve was not well visualized. Aortic valve regurgitation  is mild.   Laboratory Data:  High Sensitivity Troponin:   Recent Labs  Lab 10/31/21 1543 10/31/21 1839 11/24/21 1340 11/24/21 1620 11/24/21 1848  TROPONINIHS 10 10 40* 36* 33*     Chemistry Recent Labs  Lab 11/24/21 1107  NA 137  K 3.8  CL 103  CO2 26  GLUCOSE 101*  BUN 18  CREATININE 0.88  CALCIUM 9.0  GFRNONAA >60  ANIONGAP 8    No results for input(s): PROT, ALBUMIN, AST, ALT, ALKPHOS, BILITOT in the last 168 hours. Lipids  Recent Labs  Lab 11/25/21 0610  CHOL 145  TRIG 53  HDL 57  LDLCALC 77  CHOLHDL 2.5    Hematology Recent  Labs  Lab 11/24/21 1107 11/25/21 0610 11/26/21 0517  WBC 8.2 7.3 6.0  RBC 4.53 4.03* 4.53  HGB 14.2 12.8* 14.1  HCT 42.7 37.2* 42.1  MCV 94.3 92.3 92.9  MCH 31.3 31.8 31.1  MCHC 33.3 34.4 33.5  RDW 14.3 14.3 14.2  PLT 122* 111* 133*   Thyroid No results for input(s): TSH, FREET4 in the last 168 hours.  BNPNo results for input(s): BNP, PROBNP in the last 168 hours.  DDimer No results for input(s): DDIMER in the last 168 hours.   Radiology/Studies:  US Venous Img Lower Bilateral (DVT)  Result Date: 11/24/2021 CLINICAL DATA:  Recent pulmonary embolus. EXAM: Bilateral LOWER EXTREMITY VENOUS DOPPLER ULTRASOUND TECHNIQUE: Gray-scale sonography with compression, as well as color and duplex ultrasound, were performed to evaluate the deep venous system(s) from the level of the common femoral vein through the popliteal and proximal calf veins. COMPARISON:  None. FINDINGS: VENOUS Normal compressibility of the common femoral, superficial femoral, and popliteal veins. Visualized portions of profunda femoral vein and great saphenous vein unremarkable. Doppler waveforms in these veins show normal direction of venous flow, normal respiratory plasticity and response to augmentation. Occlusive thrombus is present in the right peroneal vein. Left peroneal vein unremarkable. OTHER None. Limitations: none IMPRESSION: Occlusive right calf vein DVT in the right peroneal vein. No other DVT identified. Electronically Signed   By: Van Clines M.D.   On: 11/24/2021 17:18   ECHOCARDIOGRAM COMPLETE  Result Date: 11/25/2021    ECHOCARDIOGRAM REPORT   Patient Name:   RUSHI CHASEN Date of Exam: 11/25/2021 Medical Rec #:  [id number]     Height:       69.0 in Accession #:    [id number]    Weight:       172.8 lb Date of Birth:  27-Nov-1932     BSA:          1.942 m Patient Age:    10 years      BP:  127/74 mmHg Patient Gender: M             HR:           90 bpm. Exam Location:  [facility] Procedure: 2D Echo and  Intracardiac Opacification Agent Indications:     Pulmonary Embolus I26.09  History:         Patient has no prior history of Echocardiogram examinations.  Sonographer:     Kathlen Brunswick RDCS Referring Phys:  [id number] Soledad Gerlach NIU Diagnosing Phys: Kate Sable MD  Sonographer Comments: Technically difficult study due to poor echo windows and suboptimal subcostal window. Image acquisition challenging due to patient body habitus. IMPRESSIONS  1. Left ventricular ejection fraction, by estimation, is 55 to 60%. The left ventricle has normal function. The left ventricle has no regional wall motion abnormalities. Left ventricular diastolic parameters are consistent with Grade I diastolic dysfunction (impaired relaxation).  2. Right ventricular systolic function is normal. The right ventricular size is not well visualized.  3. The mitral valve is grossly normal. No evidence of mitral valve regurgitation.  4. The aortic valve was not well visualized. Aortic valve regurgitation is mild. FINDINGS  Left Ventricle: Left ventricular ejection fraction, by estimation, is 55 to 60%. The left ventricle has normal function. The left ventricle has no regional wall motion abnormalities. Definity contrast agent was given IV to delineate the left ventricular  endocardial borders. The left ventricular internal cavity size was normal in size. There is no left ventricular hypertrophy. Left ventricular diastolic parameters are consistent with Grade I diastolic dysfunction (impaired relaxation). Right Ventricle: The right ventricular size is not well visualized. No increase in right ventricular wall thickness. Right ventricular systolic function is normal. Left Atrium: Left atrial size was normal in size. Right Atrium: Right atrial size was not well visualized. Pericardium: There is no evidence of pericardial effusion. Mitral Valve: The mitral valve is grossly normal. No evidence of mitral valve regurgitation. Tricuspid Valve: The tricuspid  valve is normal in structure. Tricuspid valve regurgitation is not demonstrated. Aortic Valve: The aortic valve was not well visualized. Aortic valve regurgitation is mild. Aortic valve peak gradient measures 5.1 mmHg. Pulmonic Valve: The pulmonic valve was not well visualized. Pulmonic valve regurgitation is not visualized. Aorta: The aortic root is normal in size and structure. Venous: The inferior vena cava was not well visualized. IAS/Shunts: The interatrial septum was not well visualized.  LEFT VENTRICLE PLAX 2D LVIDd:         5.15 cm     Diastology LVIDs:         3.55 cm     LV e' medial:    3.92 cm/s LV PW:         0.95 cm     LV E/e' medial:  11.4 LV IVS:        0.85 cm     LV e' lateral:   7.40 cm/s LVOT diam:     2.10 cm     LV E/e' lateral: 6.0 LV SV:         37 LV SV Index:   19 LVOT Area:     3.46 cm  LV Volumes (MOD) LV vol d, MOD A2C: 50.3 ml LV vol d, MOD A4C: 77.2 ml LV vol s, MOD A2C: 21.4 ml LV vol s, MOD A4C: 23.4 ml LV SV MOD A2C:     28.9 ml LV SV MOD A4C:     77.2 ml LV SV MOD BP:      39.5  ml LEFT ATRIUM           Index LA diam:      3.70 cm 1.91 cm/m LA Vol (A4C): 24.8 ml 12.77 ml/m  AORTIC VALVE AV Area (Vmax): 2.36 cm AV Vmax:        113.00 cm/s AV Peak Grad:   5.1 mmHg LVOT Vmax:      77.00 cm/s LVOT Vmean:     45.500 cm/s LVOT VTI:       0.108 m  AORTA Ao Root diam: 3.40 cm MITRAL VALVE MV Area (PHT): 4.83 cm    SHUNTS MV Decel Time: 157 msec    Systemic VTI:  0.11 m MV E velocity: 44.60 cm/s  Systemic Diam: 2.10 cm MV A velocity: 77.10 cm/s MV E/A ratio:  0.58 Kate Sable MD Electronically signed by Kate Sable MD Signature Date/Time: 11/25/2021/3:02:24 PM    Final      Assessment and Plan:   Paroxysmal SVT -Telemetry showing sinus rhythm, PACs -Increase PTA Cardizem to 180 mg daily -Continue Lopressor 12.5 mg twice daily -Echo with preserved ejection fraction -If no significant arrhythmias on telemetry, okay to discharge later today or tomorrow from a cardiac  perspective.  2.  PE, DVT -On Eliquis as per primary team  3.  COPD on home oxygen -Inhalers as per medicine team.  Total encounter time more than 110 minutes  Greater than 50% was spent in counseling and coordination of care with the patient  Signed, Kate Sable, MD  11/26/2021 12:40 PM

## 2021-11-26 NOTE — Consult Note (Signed)
ANTICOAGULATION CONSULT NOTE  Pharmacy Consult for IV Heparin Indication: pulmonary embolus  Patient Measurements: Height: 5\' 9"  (175.3 cm) Weight: 74.9 kg (165 lb 1.6 oz) IBW/kg (Calculated) : 70.7 Heparin Dosing Weight: 78.4  Labs: Recent Labs    11/24/21 1107 11/24/21 1340 11/24/21 1612 11/24/21 1620 11/24/21 1620 11/24/21 1848 11/25/21 0204 11/25/21 0610 11/25/21 1149 11/25/21 2010 11/26/21 0517 11/26/21 0649  HGB 14.2  --   --   --   --   --   --  12.8*  --   --  14.1  --   HCT 42.7  --   --   --   --   --   --  37.2*  --   --  42.1  --   PLT 122*  --   --   --   --   --   --  111*  --   --  133*  --   APTT  --   --  27  --   --   --  118*  --   --   --   --  96*  LABPROT  --   --  13.5  --   --   --   --   --   --   --   --   --   INR  --   --  1.0  --   --   --   --   --   --   --   --   --   HEPARINUNFRC  --   --   --  <0.10*   < >  --  0.98*  --  >1.10* 1.03*  --  0.99*  CREATININE 0.88  --   --   --   --   --   --   --   --   --   --   --   TROPONINIHS  --  40*  --  36*  --  33*  --   --   --   --   --   --    < > = values in this interval not displayed.     Estimated Creatinine Clearance: 60.3 mL/min (by C-G formula based on SCr of 0.88 mg/dL).   Medical History: Past Medical History:  Diagnosis Date   COPD (chronic obstructive pulmonary disease) ([facility])    COVID-19    GERD (gastroesophageal reflux disease)    SVT (supraventricular tachycardia) ([facility])     Medications:  Patient apparently diagnosed with acute PE on 11/17/21 and started on apixaban, however, per chart review patient was unaware he had a PE and that he was supposed to be taking a blood thinner  Assessment: Patient is an 88 y/o M with medical history including COPD, SVT, recent COVID-19 infection , recent diagnosis of acute PE on 11/17/21 who presented to the ED 1/13 with near syncope. Un-clear at this time if patient was taking Eliquis as prescribed for recently diagnosed PE, but per chart,  endorses being unaware of being on a blood thinner. Pharmacy consulted to initiate heparin infusion for PE.  Baseline CBC acceptable, platelets mildly low at 122.  Baseline aPTT, PT-INR, and heparin level are pending.  Goal of Therapy:  Heparin level 0.3-0.7 units/ml aPTT 66-102 seconds Monitor platelets by anticoagulation protocol: Yes  1/13 1620 HL < 0.10 (Baseline) 1/14 0204 HL 0.98, aPTT 118, supratherapeutic 1/14 1149 HL >1.10     Supratherap   hold  then dec to 1350u/hr 1/14 2010 HL 1.03 , supra-therapeutic @ 1350 un/hr 1/15 0649 HL 0.99, aPTT 96  Suprathera HL, decrease to 1050 u/hr  Plan:  1/15 0649 HL 0.99, aPTT 96  Supratherapeutic HL, upper end aPTT range -decrease rate to 1050 units/hr --Recheck HL and aPTT 8hrs after heparin rate change --Daily CBC per protocol while on IV heparin  Chinita Greenland PharmD Clinical Pharmacist 11/26/2021

## 2021-11-26 NOTE — Progress Notes (Signed)
°  Transition of Care [facility]) Screening Note   Patient Details  Name: Dylan Weber Date of Birth: 1932-12-09   Transition of Care [facility]) CM/SW Contact:    Alberteen Sam, LCSW Phone Number: 11/26/2021, 4:28 PM    Transition of Care Department [facility]) has reviewed patient and no TOC needs have been identified at this time. We will continue to monitor patient advancement through interdisciplinary progression rounds. If new patient transition needs arise, please place a TOC consult.  [place], [place]

## 2021-11-26 NOTE — Progress Notes (Signed)
Patient arrived to unit on 11/25/21 about 2000 with wife Jenny Reichmann at bedside. Patient oriented to staff and unit. O2 in place. C/o nose bleed from the O2, water based lubricant applied to nares. Call bell within reach. Vital signs stable. Patient slept 75% of the night.

## 2021-11-26 NOTE — Evaluation (Signed)
Physical Therapy Evaluation Patient Details Name: Dylan Weber MRN: [id number] DOB: 07/15/1933 Today's Date: 11/26/2021  History of Present Illness  Pt is an 88 y/o M admitted on 11/24/21 after presenting with near syncope. Due to SOB during recent admission pt had CT angiogram completed on 11/17/21, which showed small PE. Pt was given perscription of eliquis but did not start taking medication. On the morning of admission pt woke up feeling flushed & lightheaded. Pt is now being treated for PE, RLE DVT, & acute on chronic hypoxic respiratory failure. PMH: COPD, GERD, SVT, recent admission for covid-19 infection & d/c on 2L O2, PE. Pt has been cleared by attending physician to mobilize with therapy prior to completing 48 hrs of anticoagulation.  Clinical Impression  Pt seen for PT evaluation with pt agreeable to tx. Pt declines any issues with mobility & is able to ambulate 2 laps around nurses station, pushing O2 tank, with mod I without overt LOB. Lowest SPO2 of 89% with pt on 2L/min throughout. Messaged attending to see if pt would be a good candidate for pulmonary rehab & pt & wife aware. At this time pt does not demonstrate any acute PT needs. Left O2 tank in room & educated pt on need to ambulate while in acute setting & pt & wife voice understanding.       Recommendations for follow up therapy are one component of a multi-disciplinary discharge planning process, led by the attending physician.  Recommendations may be updated based on patient status, additional functional criteria and insurance authorization.  Follow Up Recommendations No PT follow up    Assistance Recommended at Discharge PRN  Patient can return home with the following       Equipment Recommendations Other (comment)  Recommendations for Other Services       Functional Status Assessment Patient has not had a recent decline in their functional status     Precautions / Restrictions Precautions Precautions:  None Restrictions Weight Bearing Restrictions: No      Mobility  Bed Mobility               General bed mobility comments: not observed, pt received in recliner & left standing in room    Transfers Overall transfer level: Independent Equipment used: None               General transfer comment: sit<>stand    Ambulation/Gait Ambulation/Gait assistance: Modified independent (Device/Increase time) Gait Distance (Feet): 225 Feet Assistive device:  (Pt pushes O2 tank) Gait Pattern/deviations: WFL(Within Functional Limits) Gait velocity: WNL     General Gait Details: slightly narrow step width RLE  Stairs            Wheelchair Mobility    Modified Rankin (Stroke Patients Only)       Balance Overall balance assessment: Mild deficits observed, not formally tested                                           Pertinent Vitals/Pain Pain Assessment: No/denies pain    Home Living Family/patient expects to be discharged to:: Private residence Living Arrangements: Spouse/significant other Available Help at Discharge: Family Type of Home: House Home Access: Level entry       Home Layout: One level Home Equipment: None      Prior Function Prior Level of Function : Independent/Modified Independent  Mobility Comments: Independent with mobility, denies falls, plays golf 2x/week but has been unable since requiring O2 since having Covid       Hand Dominance        Extremity/Trunk Assessment   Upper Extremity Assessment Upper Extremity Assessment: Overall WFL for tasks assessed    Lower Extremity Assessment Lower Extremity Assessment: Overall WFL for tasks assessed    Cervical / Trunk Assessment Cervical / Trunk Assessment: Normal  Communication   Communication: No difficulties  Cognition Arousal/Alertness: Awake/alert Behavior During Therapy: WFL for tasks assessed/performed Overall Cognitive Status: Within  Functional Limits for tasks assessed                                          General Comments General comments (skin integrity, edema, etc.): Pt on 2L/min via nasal cannula with lowest SpO2 of 89%    Exercises     Assessment/Plan    PT Assessment Patient does not need any further PT services  PT Problem List         PT Treatment Interventions      PT Goals (Current goals can be found in the Care Plan section)  Acute Rehab PT Goals Patient Stated Goal: go home PT Goal Formulation: With patient Time For Goal Achievement: 12/10/21 Potential to Achieve Goals: Good    Frequency       Co-evaluation               AM-PAC PT "6 Clicks" Mobility  Outcome Measure Help needed turning from your back to your side while in a flat bed without using bedrails?: None Help needed moving from lying on your back to sitting on the side of a flat bed without using bedrails?: None Help needed moving to and from a bed to a chair (including a wheelchair)?: None Help needed standing up from a chair using your arms (e.g., wheelchair or bedside chair)?: None Help needed to walk in hospital room?: None Help needed climbing 3-5 steps with a railing? : None 6 Click Score: 24    End of Session Equipment Utilized During Treatment: Oxygen Activity Tolerance: Patient tolerated treatment well Patient left:  (standing in room) Nurse Communication: Mobility status      Time: 1610-9604 PT Time Calculation (min) (ACUTE ONLY): 8 min   Charges:   PT Evaluation $PT Eval Low Complexity: 1 Low          Lavone Nian, PT, DPT 11/26/21, 3:04 PM   [name] 11/26/2021, 3:02 PM

## 2021-11-27 DIAGNOSIS — I82401 Acute embolism and thrombosis of unspecified deep veins of right lower extremity: Secondary | ICD-10-CM

## 2021-11-27 LAB — CBC
HCT: 42.4 % (ref 39.0–52.0)
Hemoglobin: 14.5 g/dL (ref 13.0–17.0)
MCH: 31.4 pg (ref 26.0–34.0)
MCHC: 34.2 g/dL (ref 30.0–36.0)
MCV: 91.8 fL (ref 80.0–100.0)
Platelets: 131 10*3/uL — ABNORMAL LOW (ref 150–400)
RBC: 4.62 MIL/uL (ref 4.22–5.81)
RDW: 14.6 % (ref 11.5–15.5)
WBC: 9.7 10*3/uL (ref 4.0–10.5)
nRBC: 0 % (ref 0.0–0.2)

## 2021-11-27 MED ORDER — PREDNISONE 20 MG PO TABS: 40.0000 mg | ORAL_TABLET | Freq: Every day | ORAL | 0 refills | Status: AC

## 2021-11-27 MED ORDER — PREDNISONE 20 MG PO TABS
40.0000 mg | ORAL_TABLET | Freq: Every day | ORAL | Status: DC
  Administered 2021-11-27: 40 mg via ORAL
  Filled 2021-11-27: qty 2

## 2021-11-27 MED ORDER — APIXABAN 5 MG PO TABS: ORAL_TABLET | ORAL | 0 refills | Status: DC

## 2021-11-27 MED ORDER — DILTIAZEM HCL ER COATED BEADS 180 MG PO CP24: 180.0000 mg | ORAL_CAPSULE | Freq: Every day | ORAL | 0 refills | Status: DC

## 2021-11-27 NOTE — Discharge Summary (Signed)
Physician Discharge Summary  Dylan Weber NTI:[id number] DOB: [birth year] DOA: 11/24/2021  PCP: [place] date: 11/24/2021 Discharge date: 11/27/2021  Admitted From: Home Disposition: Home  Recommendations for Outpatient Follow-up:  Follow up with PCP in 1-2 weeks Follow-up outpatient cardiology Dr. Rockey Situ  Home Health: No Equipment/Devices: Oxygen 2 L  Discharge Condition: Stable CODE STATUS: Full Diet recommendation: Heart healthy  Brief/Interim Summary:  88 y.o. male with medical history significant of COPD, GERD, SVT, recent admission for COVID-19 infection discharged on 2 L oxygen, PE, who presents with near syncope.   Patient was recently hospitalized from 12/20-2012/21 due to COVID-19 infection.  Patient was treated with 1 dose of remdesivir and a course of extra weight and prednisone.  Patient was discharged home on 2 L oxygen. Due to SOB, his pulmonologist, Dr. Lanney Gins ordered CT angiogram on 11/17/2021, which showed small PE.  Patient was given prescription of Eliquis, but patient did not realize this prescription and has not started taking this medication yet.   Pt states that he woke up this morning, feeling flushed and lightheaded. He was in the waiting room and had another episode of feeling flushed in ED. No LOC or fall. He has SOB, denies chest pain, cough, fever or chills.  No nausea vomiting, diarrhea or abdominal pain.  No symptoms of UTI.  No rectal bleeding or dark stool.  No unilateral numbness or tinglings in extremities.  No facial droop or slurred speech.   1/14: Patient overall remained stable.  Has intermittent episodes of flash skin and lightheadedness.  In the ER there is been associated with intermittent tachycardia.  Usually less than a minute and self resolving.  1/16: Heart rhythm is remained regular.  At time of discharge will recommend increasing home dose of Cardizem CD 280 mg daily, previous dose of 120 mg.  Discontinue beta-blocker  added in house as seem to precipitate bradycardia and hypotension.  Patient otherwise stable.  Will prescribe Eliquis at time of discharge.  Follow-up outpatient pulmonology, cardiology, PCP   Discharge Diagnoses:  Principal Problem:   Pulmonary embolism ([place]) Active Problems:   SVT (supraventricular tachycardia) (HCC)   Near syncope   Elevated troponin   Thrombocytopenia (HCC)   Pulmonary emboli (HCC)  Pulmonary embolism (HCC) Right lower extremity DVT Acute on chronic hypoxic respiratory failure CTA on 11/17/21 showed small peripheral pulmonary embolus in lower lobe branch of right pulmonary artery.  Oxygen saturation 90-93% basal level 2 L oxygen. Venous duplex positive for right lower extremity occlusive DVT Echocardiogram with normal EF, grade 1 diastolic dysfunction Plan: Discharge home.  Eliquis pharmacy dosing.  10 mg twice daily times for 7 days, followed by 5 mg twice daily for least 3 to 6 months.  45-month supply provided on discharge.  Patient back on home rate 2 L oxygen.  Patient will follow up with outpatient pulmonology    SVT (supraventricular tachycardia) ([place]):  Heart rate normally controlled with intermittent episodes of tachycardia lasting less than a minute Not associated with chest pain Self resolving No evidence of atrial fibrillation Plan: Seen by cardiology in consultation.  Dose of Cardizem CD increased to 180 mg daily (previous dose 120 mg) metoprolol tartrate started in house has been discontinued as it seemed to precipitate bradycardia and hypotension.  Eliquis as above.  Follow-up outpatient with cardiology Dr. Rockey Situ.  Near syncope  Etiology is not clear.   Potential differential diagnosis include enlargement of PE and orthostatic status.   Arrhythmogenic syncope also on differential No  focal neurodeficit on physical examination, low suspicions for stroke. Plan: Therapy evaluations completed.  No indication for follow-up   Discharge  Instructions  Discharge Instructions     Diet - low sodium heart healthy   Complete by: As directed    Increase activity slowly   Complete by: As directed       Allergies as of 11/27/2021   No Known Allergies      Medication List     TAKE these medications    acetaminophen 650 MG CR tablet Commonly known as: TYLENOL Take 650 mg by mouth as needed for pain.   albuterol 108 (90 Base) MCG/ACT inhaler Commonly known as: VENTOLIN HFA Inhale 2 puffs into the lungs every 6 (six) hours as needed for wheezing or shortness of breath.   apixaban 5 MG Tabs tablet Commonly known as: ELIQUIS Take 2 tablets (10 mg total) by mouth 2 (two) times daily for 6 days, THEN 1 tablet (5 mg total) 2 (two) times daily. Start taking on: November 27, 2021 What changed:  medication strength See the new instructions.   ascorbic acid 500 MG tablet Commonly known as: VITAMIN C Take 1 tablet (500 mg total) by mouth daily.   Breo Ellipta 200-25 MCG/ACT Aepb Generic drug: fluticasone furoate-vilanterol Inhale 1 puff into the lungs daily.   diltiazem 180 MG 24 hr capsule Commonly known as: CARDIZEM CD Take 1 capsule (180 mg total) by mouth daily. Start taking on: November 28, 2021 What changed:  medication strength how much to take   guaiFENesin 600 MG 12 hr tablet Commonly known as: MUCINEX Take 600 mg by mouth daily.   multivitamin capsule Take 1 capsule by mouth daily.   pantoprazole 40 MG tablet Commonly known as: PROTONIX Take 40 mg by mouth daily.   predniSONE 20 MG tablet Commonly known as: DELTASONE Take 2 tablets (40 mg total) by mouth daily with breakfast for 5 days. What changed:  medication strength how much to take how to take this when to take this additional instructions   pseudoephedrine-acetaminophen 30-500 MG Tabs tablet Commonly known as: TYLENOL SINUS Take 1 tablet by mouth as needed.   zinc sulfate 220 (50 Zn) MG capsule Take 1 capsule (220 mg total) by  mouth daily.        No Known Allergies  Consultations: Cardiology-[facility]   Procedures/Studies: DG Chest 2 View  Result Date: 10/31/2021 CLINICAL DATA:  Syncope EXAM: CHEST - 2 VIEW COMPARISON:  None. FINDINGS: The heart size and mediastinal contours are within normal limits. Shallow inspiration with low lung volumes. Both lungs are clear. No pleural effusion or pneumothorax. The visualized skeletal structures are unremarkable. IMPRESSION: No active cardiopulmonary disease. Electronically Signed   By: Macy Mis M.D.   On: 10/31/2021 16:18   CT Angio Chest Pulmonary Embolism (PE) W or WO Contrast  Result Date: 11/17/2021 CLINICAL DATA:  COVID-19 positive. EXAM: CT ANGIOGRAPHY CHEST WITH CONTRAST TECHNIQUE: Multidetector CT imaging of the chest was performed using the standard protocol during bolus administration of intravenous contrast. Multiplanar CT image reconstructions and MIPs were obtained to evaluate the vascular anatomy. CONTRAST:  41mL OMNIPAQUE IOHEXOL 350 MG/ML SOLN COMPARISON:  October 31, 2021. FINDINGS: Cardiovascular: Linear filling defect is seen in lower lobe branch of right pulmonary artery posteriorly consistent with acute pulmonary embolus. Normal cardiac size. No pericardial effusion. Mild coronary calcifications are noted. Atherosclerosis of thoracic aorta is noted without aneurysm formation. Mediastinum/Nodes: No enlarged mediastinal, hilar, or axillary lymph nodes. Thyroid gland, trachea, and  esophagus demonstrate no significant findings. Lungs/Pleura: No pneumothorax or pleural effusion is noted. Emphysematous disease is noted throughout both lungs. Right apical scarring is noted. Upper Abdomen: No acute abnormality. Musculoskeletal: No chest wall abnormality. No acute or significant osseous findings. Review of the MIP images confirms the above findings. IMPRESSION: Small peripheral pulmonary embolus is noted in lower lobe branch of right pulmonary artery. Critical  Value/emergent results were called by telephone at the time of interpretation on 11/17/2021 at 2:15 pm to provider FUAD ALESKEROV , who verbally acknowledged these results. Mild coronary artery calcifications are noted. Aortic Atherosclerosis (ICD10-I70.0) and Emphysema (ICD10-J43.9). Electronically Signed   By: Marijo Conception M.D.   On: 11/17/2021 14:16   US Venous Img Lower Bilateral (DVT)  Result Date: 11/24/2021 CLINICAL DATA:  Recent pulmonary embolus. EXAM: Bilateral LOWER EXTREMITY VENOUS DOPPLER ULTRASOUND TECHNIQUE: Gray-scale sonography with compression, as well as color and duplex ultrasound, were performed to evaluate the deep venous system(s) from the level of the common femoral vein through the popliteal and proximal calf veins. COMPARISON:  None. FINDINGS: VENOUS Normal compressibility of the common femoral, superficial femoral, and popliteal veins. Visualized portions of profunda femoral vein and great saphenous vein unremarkable. Doppler waveforms in these veins show normal direction of venous flow, normal respiratory plasticity and response to augmentation. Occlusive thrombus is present in the right peroneal vein. Left peroneal vein unremarkable. OTHER None. Limitations: none IMPRESSION: Occlusive right calf vein DVT in the right peroneal vein. No other DVT identified. Electronically Signed   By: Van Clines M.D.   On: 11/24/2021 17:18   ECHOCARDIOGRAM COMPLETE  Result Date: 11/25/2021    ECHOCARDIOGRAM REPORT   Patient Name:   Dylan Weber Date of Exam: 11/25/2021 Medical Rec #:  [id number]     Height:       69.0 in Accession #:    [id number]    Weight:       172.8 lb Date of Birth:  October 08, 1933     BSA:          1.942 m Patient Age:    88 years      BP:           127/74 mmHg Patient Gender: M             HR:           90 bpm. Exam Location:  [facility] Procedure: 2D Echo and Intracardiac Opacification Agent Indications:     Pulmonary Embolus I26.09  History:         Patient has no prior  history of Echocardiogram examinations.  Sonographer:     Kathlen Brunswick RDCS Referring Phys:  [id number] Soledad Gerlach NIU Diagnosing Phys: Kate Sable MD  Sonographer Comments: Technically difficult study due to poor echo windows and suboptimal subcostal window. Image acquisition challenging due to patient body habitus. IMPRESSIONS  1. Left ventricular ejection fraction, by estimation, is 55 to 60%. The left ventricle has normal function. The left ventricle has no regional wall motion abnormalities. Left ventricular diastolic parameters are consistent with Grade I diastolic dysfunction (impaired relaxation).  2. Right ventricular systolic function is normal. The right ventricular size is not well visualized.  3. The mitral valve is grossly normal. No evidence of mitral valve regurgitation.  4. The aortic valve was not well visualized. Aortic valve regurgitation is mild. FINDINGS  Left Ventricle: Left ventricular ejection fraction, by estimation, is 55 to 60%. The left ventricle has normal function. The left ventricle has no regional wall motion  abnormalities. Definity contrast agent was given IV to delineate the left ventricular  endocardial borders. The left ventricular internal cavity size was normal in size. There is no left ventricular hypertrophy. Left ventricular diastolic parameters are consistent with Grade I diastolic dysfunction (impaired relaxation). Right Ventricle: The right ventricular size is not well visualized. No increase in right ventricular wall thickness. Right ventricular systolic function is normal. Left Atrium: Left atrial size was normal in size. Right Atrium: Right atrial size was not well visualized. Pericardium: There is no evidence of pericardial effusion. Mitral Valve: The mitral valve is grossly normal. No evidence of mitral valve regurgitation. Tricuspid Valve: The tricuspid valve is normal in structure. Tricuspid valve regurgitation is not demonstrated. Aortic Valve: The aortic valve was  not well visualized. Aortic valve regurgitation is mild. Aortic valve peak gradient measures 5.1 mmHg. Pulmonic Valve: The pulmonic valve was not well visualized. Pulmonic valve regurgitation is not visualized. Aorta: The aortic root is normal in size and structure. Venous: The inferior vena cava was not well visualized. IAS/Shunts: The interatrial septum was not well visualized.  LEFT VENTRICLE PLAX 2D LVIDd:         5.15 cm     Diastology LVIDs:         3.55 cm     LV e' medial:    3.92 cm/s LV PW:         0.95 cm     LV E/e' medial:  11.4 LV IVS:        0.85 cm     LV e' lateral:   7.40 cm/s LVOT diam:     2.10 cm     LV E/e' lateral: 6.0 LV SV:         37 LV SV Index:   19 LVOT Area:     3.46 cm  LV Volumes (MOD) LV vol d, MOD A2C: 50.3 ml LV vol d, MOD A4C: 77.2 ml LV vol s, MOD A2C: 21.4 ml LV vol s, MOD A4C: 23.4 ml LV SV MOD A2C:     28.9 ml LV SV MOD A4C:     77.2 ml LV SV MOD BP:      39.5 ml LEFT ATRIUM           Index LA diam:      3.70 cm 1.91 cm/m LA Vol (A4C): 24.8 ml 12.77 ml/m  AORTIC VALVE AV Area (Vmax): 2.36 cm AV Vmax:        113.00 cm/s AV Peak Grad:   5.1 mmHg LVOT Vmax:      77.00 cm/s LVOT Vmean:     45.500 cm/s LVOT VTI:       0.108 m  AORTA Ao Root diam: 3.40 cm MITRAL VALVE MV Area (PHT): 4.83 cm    SHUNTS MV Decel Time: 157 msec    Systemic VTI:  0.11 m MV E velocity: 44.60 cm/s  Systemic Diam: 2.10 cm MV A velocity: 77.10 cm/s MV E/A ratio:  0.58 Kate Sable MD Electronically signed by Kate Sable MD Signature Date/Time: 11/25/2021/3:02:24 PM    Final       Subjective: Patient seen and examined on the day of discharge.  Stable no distress.  Respiratory status at baseline.  No further palpitations.  Discharge Exam: Vitals:   11/27/21 0450 11/27/21 0737  BP: (!) 105/91 117/76  Pulse: (!) 56 (!) 59  Resp: 19 16  Temp: 98.3 F (36.8 C) 98.2 F (36.8 C)  SpO2: 100% 90%   Vitals:  11/26/21 2155 11/27/21 0018 11/27/21 0450 11/27/21 0737  BP: 125/67 130/73  (!) 105/91 117/76  Pulse: 78 (!) 50 (!) 56 (!) 59  Resp: 20 18 19 16   Temp: 97.9 F (36.6 C) 98.4 F (36.9 C) 98.3 F (36.8 C) 98.2 F (36.8 C)  TempSrc:    Oral  SpO2: 99% 100% 100% 90%  Weight:      Height:        General: Pt is alert, awake, not in acute distress Cardiovascular: RRR, S1/S2 +, no rubs, no gallops Respiratory: CTA bilaterally, no wheezing, no rhonchi Abdominal: Soft, NT, ND, bowel sounds + Extremities: no edema, no cyanosis    The results of significant diagnostics from this hospitalization (including imaging, microbiology, ancillary and laboratory) are listed below for reference.     Microbiology: No results found for this or any previous visit (from the past 240 hour(s)).   Labs: BNP (last 3 results) No results for input(s): BNP in the last 8760 hours. Basic Metabolic Panel: Recent Labs  Lab 11/24/21 1107  NA 137  K 3.8  CL 103  CO2 26  GLUCOSE 101*  BUN 18  CREATININE 0.88  CALCIUM 9.0   Liver Function Tests: No results for input(s): AST, ALT, ALKPHOS, BILITOT, PROT, ALBUMIN in the last 168 hours. No results for input(s): LIPASE, AMYLASE in the last 168 hours. No results for input(s): AMMONIA in the last 168 hours. CBC: Recent Labs  Lab 11/24/21 1107 11/25/21 0610 11/26/21 0517 11/27/21 0502  WBC 8.2 7.3 6.0 9.7  HGB 14.2 12.8* 14.1 14.5  HCT 42.7 37.2* 42.1 42.4  MCV 94.3 92.3 92.9 91.8  PLT 122* 111* 133* 131*   Cardiac Enzymes: No results for input(s): CKTOTAL, CKMB, CKMBINDEX, TROPONINI in the last 168 hours. BNP: Invalid input(s): POCBNP CBG: No results for input(s): GLUCAP in the last 168 hours. D-Dimer No results for input(s): DDIMER in the last 72 hours. Hgb A1c Recent Labs    11/24/21 1612  HGBA1C 5.8*   Lipid Profile Recent Labs    11/25/21 0610  CHOL 145  HDL 57  LDLCALC 77  TRIG 53  CHOLHDL 2.5   Thyroid function studies No results for input(s): TSH, T4TOTAL, T3FREE, THYROIDAB in the last 72  hours.  Invalid input(s): FREET3 Anemia work up No results for input(s): VITAMINB12, FOLATE, FERRITIN, TIBC, IRON, RETICCTPCT in the last 72 hours. Urinalysis    Component Value Date/Time   COLORURINE YELLOW (A) 11/24/2021 1600   APPEARANCEUR CLEAR (A) 11/24/2021 1600   LABSPEC 1.016 11/24/2021 1600   PHURINE 7.0 11/24/2021 1600   GLUCOSEU NEGATIVE 11/24/2021 1600   HGBUR NEGATIVE 11/24/2021 1600   BILIRUBINUR NEGATIVE 11/24/2021 1600   KETONESUR NEGATIVE 11/24/2021 1600   PROTEINUR NEGATIVE 11/24/2021 1600   NITRITE NEGATIVE 11/24/2021 1600   LEUKOCYTESUR NEGATIVE 11/24/2021 1600   Sepsis Labs Invalid input(s): PROCALCITONIN,  WBC,  LACTICIDVEN Microbiology No results found for this or any previous visit (from the past 240 hour(s)).   Time coordinating discharge: Over 30 minutes  SIGNED:   Sidney Ace, MD  [facility] 11/27/2021, 11:19 AM Pager   If 7PM-7AM, please contact night-coverage

## 2021-11-27 NOTE — Consult Note (Signed)
Patient was counseled on apixaban and given the coupon for a free 30-day trial.   Thank you,  Eleonore Chiquito, PharmD, BCPS

## 2021-11-27 NOTE — Progress Notes (Signed)
Progress Note  Patient Name: Dylan Weber Date of Encounter: 11/27/2021  [facility] Cardiologist: Ida Rogue, MD   Subjective   Plan for d/c later today. No SVT noted on tele. Overnight patient was bradycardic with soft pressures on Cardizem and Lopressor, and Lopressor discontinued.   Inpatient Medications    Scheduled Meds:  apixaban  10 mg Oral BID   Followed by   Derrill Memo ON 12/03/2021] apixaban  5 mg Oral BID   ascorbic acid  500 mg Oral Daily   diltiazem  180 mg Oral Daily   fluticasone furoate-vilanterol  1 puff Inhalation Daily   multivitamin with minerals  1 tablet Oral Daily   pantoprazole  40 mg Oral Daily   predniSONE  40 mg Oral Q breakfast   zinc sulfate  220 mg Oral Daily   Continuous Infusions:  PRN Meds: acetaminophen, albuterol, dextromethorphan-guaiFENesin, hydrALAZINE, ondansetron (ZOFRAN) IV, traZODone   Vital Signs    Vitals:   11/26/21 2155 11/27/21 0018 11/27/21 0450 11/27/21 0737  BP: 125/67 130/73 (!) 105/91 117/76  Pulse: 78 (!) 50 (!) 56 (!) 59  Resp: 20 18 19 16   Temp: 97.9 F (36.6 C) 98.4 F (36.9 C) 98.3 F (36.8 C) 98.2 F (36.8 C)  TempSrc:    Oral  SpO2: 99% 100% 100% 90%  Weight:      Height:       No intake or output data in the 24 hours ending 11/27/21 1128 Last 3 Weights 11/25/2021 11/24/2021 10/31/2021  Weight (lbs) 165 lb 1.6 oz 172 lb 13.5 oz 173 lb  Weight (kg) 74.889 kg 78.4 kg 78.472 kg      Telemetry    NSR, HR 70-80s, into the 50s overnight - Personally Reviewed  ECG    No new - Personally Reviewed  Physical Exam   GEN: No acute distress.   Neck: No JVD Cardiac: RRR, no murmurs, rubs, or gallops.  Respiratory: Clear to auscultation bilaterally. GI: Soft, nontender, non-distended  MS: No edema; No deformity. Neuro:  Nonfocal  Psych: Normal affect   Labs    High Sensitivity Troponin:   Recent Labs  Lab 10/31/21 1543 10/31/21 1839 11/24/21 1340 11/24/21 1620 11/24/21 1848  TROPONINIHS  10 10 40* 36* 33*     Chemistry Recent Labs  Lab 11/24/21 1107  NA 137  K 3.8  CL 103  CO2 26  GLUCOSE 101*  BUN 18  CREATININE 0.88  CALCIUM 9.0  GFRNONAA >60  ANIONGAP 8    Lipids  Recent Labs  Lab 11/25/21 0610  CHOL 145  TRIG 53  HDL 57  LDLCALC 77  CHOLHDL 2.5    Hematology Recent Labs  Lab 11/25/21 0610 11/26/21 0517 11/27/21 0502  WBC 7.3 6.0 9.7  RBC 4.03* 4.53 4.62  HGB 12.8* 14.1 14.5  HCT 37.2* 42.1 42.4  MCV 92.3 92.9 91.8  MCH 31.8 31.1 31.4  MCHC 34.4 33.5 34.2  RDW 14.3 14.2 14.6  PLT 111* 133* 131*   Thyroid No results for input(s): TSH, FREET4 in the last 168 hours.  BNPNo results for input(s): BNP, PROBNP in the last 168 hours.  DDimer No results for input(s): DDIMER in the last 168 hours.   Radiology    No results found.  Cardiac Studies   TTE 11/25/2021 1. Left ventricular ejection fraction, by estimation, is 55 to 60%. The  left ventricle has normal function. The left ventricle has no regional  wall motion abnormalities. Left ventricular diastolic parameters are  consistent with Grade I diastolic  dysfunction (impaired relaxation).   2. Right ventricular systolic function is normal. The right ventricular  size is not well visualized.   3. The mitral valve is grossly normal. No evidence of mitral valve  regurgitation.   4. The aortic valve was not well visualized. Aortic valve regurgitation  is mild.   Patient Profile     88 y.o. male  with a hx of COPD on2 L O2, former smoker, paroxysmal SVT who is being seen 11/26/2021 for the evaluation of SVT.  Assessment & Plan    Paroxysmal SVT/atrial tachycardia - hospitalized in late December with COVID-19 infection. Noted to have SVT on telemetry. Discharged on 2L O2, Cardizem and prednisone. Saw pulmonology for SOB and chest CT 1/6 showed PE and he was given Eliquis, but did not start it.  - presented 1/13 with near syncope, unclear etiology - cardizem increased to 180mg  daily  -  BB held for bradycardia, soft pressures - Echo showed normal LVEF - may need heart monitor at d/c for near syncope, to evaluate SVT - plan for d/c today  DVT/PE - CTA chest 1/6 showed small PE - venous doppler showed +R DVT - continue Eliquis  COPD on home O2 - per primary team   For questions or updates, please contact [facility] Please consult [email or web address] for contact info under        Signed, Nikiyah Fackler Ninfa Meeker, PA-C  11/27/2021, 11:28 AM

## 2021-12-03 NOTE — Progress Notes (Signed)
Cardiology Office Note  Date:  12/04/2021   ID:  Dylan Weber, DOB April 17, 1933, MRN [id number]  PCP:  [place]   Chief Complaint  Patient presents with   [facility] follow up     Patient c/o anxiety, unable to sleep, shortness of breath & weakness.  Medications reviewed by the patient verbally.     HPI:  Dylan Weber is a 88 year old male with history of paroxysmal SVT, PE, DVT COPD on 2 L home oxygen, former smoker x50+ years,  Admission to the hospital January 2023 feeling faint, presyncope/SVT CT showing PE, lower extremity ultrasound was obtained showing DVT in the right lower extremity.,  Started on Eliquis Who presents for follow-up in the office for his PE and SVT  Recent records reviewed 10/17/21 reports seeing pulmonary, COVID-19 positive, COPD exacerbation Treated with PACs Flovent and prednisone  On October 31, 2021 presented to the emergency room with near syncope Saturations 84% on room air Was admitted overnight Treated for atrial tachycardia/SVT, started on Cardizem CD  November 17, 2021 CT scan chest, small PE  November 24, 2021 Admitted to the hospital with near syncope Lower extremity venous Doppler : occlusive right calf vein DVT in the right peroneal vein. No other DVT identified. For tachycardia diltiazem extended release increased up to 180 mg daily Started on Eliquis at discharge  Echocardiogram reviewed on today's visit November 25, 2021  1. Left ventricular ejection fraction, by estimation, is 55 to 60%. The  left ventricle has normal function. The left ventricle has no regional  wall motion abnormalities. Left ventricular diastolic parameters are  consistent with Grade I diastolic  dysfunction (impaired relaxation).   2. Right ventricular systolic function is normal. The right ventricular  size is not well visualized.   3. The mitral valve is grossly normal. No evidence of mitral valve  regurgitation.   4. The aortic valve was not well  visualized. Aortic valve regurgitation  is mild.   EKG personally reviewed by myself on todays visit Normal sinus rhythm rate 91 bpm PVCs  PMH:   has a past medical history of COPD (chronic obstructive pulmonary disease) ([place]), COVID-19, GERD (gastroesophageal reflux disease), and SVT (supraventricular tachycardia) ([place]).  PSH:    Past Surgical History:  Procedure Laterality Date   APPENDECTOMY     CHOLECYSTECTOMY      Current Outpatient Medications  Medication Sig Dispense Refill   acetaminophen (TYLENOL) 650 MG CR tablet Take 650 mg by mouth as needed for pain.     albuterol (VENTOLIN HFA) 108 (90 Base) MCG/ACT inhaler Inhale 2 puffs into the lungs every 6 (six) hours as needed for wheezing or shortness of breath. 8 g 0   apixaban (ELIQUIS) 5 MG TABS tablet Take 2 tablets (10 mg total) by mouth 2 (two) times daily for 6 days, THEN 1 tablet (5 mg total) 2 (two) times daily. 192 tablet 0   ascorbic acid (VITAMIN C) 500 MG tablet Take 1 tablet (500 mg total) by mouth daily. 30 tablet 0   BREO ELLIPTA 200-25 MCG/ACT AEPB Inhale 1 puff into the lungs daily.     diltiazem (CARDIZEM CD) 180 MG 24 hr capsule Take 1 capsule (180 mg total) by mouth daily. 30 capsule 0   guaiFENesin (MUCINEX) 600 MG 12 hr tablet Take 600 mg by mouth daily.     Multiple Vitamin (MULTIVITAMIN) capsule Take 1 capsule by mouth daily.     pantoprazole (PROTONIX) 40 MG tablet Take 40 mg by mouth daily.  pseudoephedrine-acetaminophen (TYLENOL SINUS) 30-500 MG TABS tablet Take 1 tablet by mouth as needed.     zinc sulfate 220 (50 Zn) MG capsule Take 1 capsule (220 mg total) by mouth daily. 30 capsule 0   No current facility-administered medications for this visit.     Allergies:   Patient has no known allergies.   Social History:  The patient  reports that he has quit smoking. His smoking use included cigarettes. He has never used smokeless tobacco. He reports that he does not currently use alcohol. He reports  that he does not use drugs.   Family History:   family history includes Brain cancer in his sister; Uterine cancer in his mother.    Review of Systems: Review of Systems  Constitutional: Negative.   HENT: Negative.    Respiratory:  Positive for shortness of breath.   Cardiovascular: Negative.   Gastrointestinal: Negative.   Musculoskeletal: Negative.   Neurological: Negative.   Psychiatric/Behavioral: Negative.    All other systems reviewed and are negative.   PHYSICAL EXAM: VS:  BP 100/62 (BP Location: Left Arm, Patient Position: Sitting, Cuff Size: Normal)    Pulse 91    Ht 5\' 9"  (1.753 m)    Wt 174 lb 2 oz (79 kg)    SpO2 92% Comment: 2 Liters   BMI 25.71 kg/m  , BMI Body mass index is 25.71 kg/m. GEN: Well nourished, well developed, in no acute distress HEENT: normal Neck: no JVD, carotid bruits, or masses Cardiac: RRR; no murmurs, rubs, or gallops,no edema  Respiratory:  clear to auscultation bilaterally, normal work of breathing GI: soft, nontender, nondistended, + BS MS: no deformity or atrophy Skin: warm and dry, no rash Neuro:  Strength and sensation are intact Psych: euthymic mood, full affect  Recent Labs: 10/31/2021: ALT 20; TSH 1.028 11/24/2021: BUN 18; Creatinine, Ser 0.88; Potassium 3.8; Sodium 137 11/27/2021: Hemoglobin 14.5; Platelets 131    Lipid Panel Lab Results  Component Value Date   CHOL 145 11/25/2021   HDL 57 11/25/2021   LDLCALC 77 11/25/2021   TRIG 53 11/25/2021      Wt Readings from Last 3 Encounters:  12/04/21 174 lb 2 oz (79 kg)  11/25/21 165 lb 1.6 oz (74.9 kg)  10/31/21 173 lb (78.5 kg)       ASSESSMENT AND PLAN:  Problem List Items Addressed This Visit       Cardiology Problems   Near syncope   Pulmonary embolism ([place]) - Primary   Other Visit Diagnoses     PSVT (paroxysmal supraventricular tachycardia) (HCC)       Chronic obstructive pulmonary disease, unspecified COPD type (HCC)          SVT Asymptomatic, denies  further episodes of near syncope or syncope Blood pressure low, Worsening lower extremity edema, will not increase the diltiazem extended release dose If swelling gets worse we have discussed with him that we may need to decrease the dose of diltiazem, add beta-blocker or antiarrhythmic He monitors heart rate at home with pulse oximeter, has been relatively sedentary, reports some symptoms  Pulmonary embolism Likely in the setting of being sedentary during COVID-19 infection No right heart strain, small on CT On Eliquis Has follow-up with pulmonary  Chronic respiratory distress Long history of smoking, COPD, recent COVID, no PE On oxygen 3 L, followed by pulmonary Reports symptoms stable, slowly getting better    Total encounter time more than 45 minutes  Greater than 50% was spent in counseling and  coordination of care with the patient    Signed, Esmond Plants, M.D., Ph.D. [place], [place]

## 2021-12-04 ENCOUNTER — Ambulatory Visit: Payer: Medicare Other | Admitting: Cardiovascular Disease

## 2021-12-04 ENCOUNTER — Other Ambulatory Visit: Payer: Self-pay

## 2021-12-04 ENCOUNTER — Encounter: Payer: Self-pay | Admitting: Cardiovascular Disease

## 2021-12-04 VITALS — BP 100/62 | HR 91 | Ht 69.0 in | Wt 174.1 lb

## 2021-12-04 DIAGNOSIS — I2699 Other pulmonary embolism without acute cor pulmonale: Secondary | ICD-10-CM

## 2021-12-04 DIAGNOSIS — I471 Supraventricular tachycardia: Secondary | ICD-10-CM

## 2021-12-04 DIAGNOSIS — R55 Syncope and collapse: Secondary | ICD-10-CM

## 2021-12-04 DIAGNOSIS — J449 Chronic obstructive pulmonary disease, unspecified: Secondary | ICD-10-CM

## 2021-12-04 NOTE — Patient Instructions (Signed)
Medication Instructions:  No changes  If you need a refill on your cardiac medications before your next appointment, please call your pharmacy.    Lab work: No new labs needed   Testing/Procedures: No new testing needed   Follow-Up: At [facility], you and your health needs are our priority.  As part of our continuing mission to provide you with exceptional heart care, we have created designated Provider Care Teams.  These Care Teams include your primary Cardiologist (physician) and Advanced Practice Providers (APPs -  Physician Assistants and Nurse Practitioners) who all work together to provide you with the care you need, when you need it.  You will need a follow up appointment in 6 months  Providers on your designated Care Team:   Christopher Berge, NP Ryan Dunn, PA-C Cadence Furth, PA-C  COVID-19 Vaccine Information can be found at: [email or web address] For questions related to vaccine distribution or appointments, please email [email or web address] or call [phone].   

## 2021-12-06 ENCOUNTER — Ambulatory Visit
Admission: RE | Admit: 2021-12-06 | Discharge: 2021-12-06 | Disposition: A | Discharge status: Home / Self Care | Payer: Medicare Other | Attending: Nurse Practitioner | Admitting: Nurse Practitioner

## 2021-12-06 ENCOUNTER — Encounter: Payer: Self-pay | Admitting: Nurse Practitioner

## 2021-12-06 ENCOUNTER — Ambulatory Visit: Payer: Medicare Other | Admitting: Nurse Practitioner

## 2021-12-06 ENCOUNTER — Telehealth: Payer: Self-pay | Admitting: Cardiovascular Disease

## 2021-12-06 ENCOUNTER — Ambulatory Visit
Admission: RE | Admit: 2021-12-06 | Discharge: 2021-12-06 | Disposition: A | Discharge status: Home / Self Care | Payer: Medicare Other | Source: Ambulatory Visit | Attending: Nurse Practitioner | Admitting: Nurse Practitioner

## 2021-12-06 ENCOUNTER — Other Ambulatory Visit: Payer: Self-pay

## 2021-12-06 VITALS — BP 98/60 | HR 111 | Ht 69.0 in | Wt 175.0 lb

## 2021-12-06 DIAGNOSIS — I2699 Other pulmonary embolism without acute cor pulmonale: Secondary | ICD-10-CM

## 2021-12-06 DIAGNOSIS — R0602 Shortness of breath: Secondary | ICD-10-CM | POA: Insufficient documentation

## 2021-12-06 DIAGNOSIS — I5189 Other ill-defined heart diseases: Secondary | ICD-10-CM

## 2021-12-06 DIAGNOSIS — R0689 Other abnormalities of breathing: Secondary | ICD-10-CM

## 2021-12-06 DIAGNOSIS — I491 Atrial premature depolarization: Secondary | ICD-10-CM

## 2021-12-06 DIAGNOSIS — I824Y1 Acute embolism and thrombosis of unspecified deep veins of right proximal lower extremity: Secondary | ICD-10-CM

## 2021-12-06 DIAGNOSIS — J9610 Chronic respiratory failure, unspecified whether with hypoxia or hypercapnia: Secondary | ICD-10-CM | POA: Diagnosis not present

## 2021-12-06 DIAGNOSIS — I471 Supraventricular tachycardia: Secondary | ICD-10-CM

## 2021-12-06 NOTE — Patient Instructions (Signed)
Medication Instructions:  - Your physician recommends that you continue on your current medications as directed. Please refer to the Current Medication list given to you today.  *If you need a refill on your cardiac medications before your next appointment, please call your pharmacy*   Lab Work: - none ordered  If you have labs (blood work) drawn today and your tests are completely normal, you will receive your results only by: [place] (if you have MyChart) OR A paper copy in the mail If you have any lab test that is abnormal or we need to change your treatment, we will call you to review the results.   Testing/Procedures: 1) Chest X-Ray: - A chest x-ray takes a picture of the organs and structures inside the chest, including the heart, lungs, and blood vessels. This test can show several things, including, whether the heart is enlarges; whether fluid is building up in the lungs; and whether pacemaker / defibrillator leads are still in place.  - Please go to the [place] [facility] - 1st desk on the right, Registration, to check in   Follow-Up: At [facility], you and your health needs are our priority.  As part of our continuing mission to provide you with exceptional heart care, we have created designated Provider Care Teams.  These Care Teams include your primary Cardiologist (physician) and Advanced Practice Providers (APPs -  Physician Assistants and Nurse Practitioners) who all work together to provide you with the care you need, when you need it.  We recommend signing up for the patient portal called "MyChart".  Sign up information is provided on this After Visit Summary.  MyChart is used to connect with patients for Virtual Visits (Telemedicine).  Patients are able to view lab/test results, encounter notes, upcoming appointments, etc.  Non-urgent messages can be sent to your provider as well.   To learn more about what you can do with MyChart, go to  [email or web address].    Your next appointment:   1 month(s)  The format for your next appointment:   In Person  Provider:   You may see Ida Rogue, MD or one of the following Advanced Practice Providers on your designated Care Team:   Murray Hodgkins, NP    Other Instructions N/a

## 2021-12-06 NOTE — Telephone Encounter (Signed)
STAT if HR is under 50 or over 120 (normal HR is 60-100 beats per minute)  What is your heart rate? 58 per oximeter 92-94 % o2 sat  Do you have a log of your heart rate readings (document readings)? no  Do you have any other symptoms? Patient requested o2 be turned up due to sob but neighbor is a nurse and reports sats 92-94 % with irregular pulse    Would like to be seen asap  due to sob and fatigue

## 2021-12-06 NOTE — Progress Notes (Signed)
Office Visit    Patient Name: Dylan Weber Date of Encounter: 12/06/2021  Primary Care Provider:  [place] Primary Cardiologist:  Ida Rogue, MD  Chief Complaint   88 y/o ? w/ a h/o recent COVID-19 infection (10/2021), PSVT, remote tob abuse, COPD, and recent admission for presyncope, PE, and RLE DVT, who presents for f/u related to dyspnea.  Past Medical History    Past Medical History:  Diagnosis Date   COPD (chronic obstructive pulmonary disease) ([place])    Coronary artery calcification seen on CT scan 11/17/2021   COVID-19 62/8366   Diastolic dysfunction    a. 11/2021 Echo: EF 55-60%, no rwma, GrI DD, nl RV fxn. Mild AI.   GERD (gastroesophageal reflux disease)    Pulmonary embolism ([place])    a. 11/2021 CTA Chest: small peripheral PE in RLL branch of PA-->Eliquis.   Right leg DVT ([place])    a. 11/2021 LE U/S: occlusive R calf vein DVT in R peroneal vein-->eliquis.   SVT (supraventricular tachycardia) ([place])    a. Dx 10/2021 in setting of COVID infxn.   Past Surgical History:  Procedure Laterality Date   APPENDECTOMY     CHOLECYSTECTOMY     Allergies  No Known Allergies  History of Present Illness   88 y/o ? w/ a h/o recent COVID-19 infection (10/2021), PSVT, remote tob abuse, COPD, and recent admission for presyncope, PE, and RLE DVT.  In 10/2021, he was admitted w/ resp failure and COVID-19 infection.  During admission, he was noted to have PSVT, which was managed w/ oral dilitiazem and ? blocker.  He was d/c'd on O2 and f/u w/ pulmonology in the outpatient setting on December 29 with ongoing dyspnea.  He was noted the D-dimer is elevated 0.46 in the setting of COVID and he was scheduled for CTA of the chest.  Sleep study was also scheduled.  Patient underwent CT of the chest on January 6 which showed small peripheral PE in the right lower lobe branch of the pulmonary artery.  Was prescribed Eliquis but unfortunately, was not taking.  He experienced  presyncope on January 13, prompting him to present to [facility] ED.  There, he has runs of SVT and metoprolol 12.5 mg BID was added.  LE U/S was performed and revealed a RLE DVT.  Eliquis was started.  Echo during admission showed an EF of 55 to 60% without regional motion abnormalities and grade 1 diastolic dysfunction.  Since discharge, he has cont to note DOE.  He is wearing O2.  He was seen in clinic 12/04/21 and was felt to be stable.  This AM, he bent to pick something up, and upon standing, he noted dyspnea.  He sat and says for about an hour or more, he had trouble getting a satisfying breath.  He was not necessarily dyspneic.  He was not having any chest pain.  His wife felt like he was anxious.  Symptoms eventually resolved but he called the office and made an appointment today.  He is currently symptom free.  He does have lower ext swelling, but this has been stable.   Home Medications    Current Outpatient Medications  Medication Sig Dispense Refill   acetaminophen (TYLENOL) 650 MG CR tablet Take 650 mg by mouth as needed for pain.     albuterol (VENTOLIN HFA) 108 (90 Base) MCG/ACT inhaler Inhale 2 puffs into the lungs every 6 (six) hours as needed for wheezing or shortness of breath. 8 g  0   apixaban (ELIQUIS) 5 MG TABS tablet Take 2 tablets (10 mg total) by mouth 2 (two) times daily for 6 days, THEN 1 tablet (5 mg total) 2 (two) times daily. 192 tablet 0   ascorbic acid (VITAMIN C) 500 MG tablet Take 1 tablet (500 mg total) by mouth daily. 30 tablet 0   BREO ELLIPTA 200-25 MCG/ACT AEPB Inhale 1 puff into the lungs daily.     diltiazem (CARDIZEM CD) 180 MG 24 hr capsule Take 1 capsule (180 mg total) by mouth daily. 30 capsule 0   guaiFENesin (MUCINEX) 600 MG 12 hr tablet Take 600 mg by mouth daily.     Multiple Vitamin (MULTIVITAMIN) capsule Take 1 capsule by mouth daily.     pantoprazole (PROTONIX) 40 MG tablet Take 40 mg by mouth daily.     pseudoephedrine-acetaminophen (TYLENOL  SINUS) 30-500 MG TABS tablet Take 1 tablet by mouth as needed.     zinc sulfate 220 (50 Zn) MG capsule Take 1 capsule (220 mg total) by mouth daily. 30 capsule 0   No current facility-administered medications for this visit.     Review of Systems    +++ DOE and lower ext edema.  He denies palps, pnd, orthopnea, n, v, dizziness, syncope, or early satiety.  All other systems reviewed and are otherwise negative except as noted above.    Physical Exam    VS:  BP 98/60 (BP Location: Left Arm, Patient Position: Sitting, Cuff Size: Normal)    Pulse (!) 111    Ht 5\' 9"  (1.753 m)    Wt 175 lb (79.4 kg)    SpO2 90% Comment: oxygen @ 2 Liters   BMI 25.84 kg/m  , BMI Body mass index is 25.84 kg/m.     GEN: Well nourished, well developed, in no acute distress. HEENT: normal. Neck: Supple, no JVD, carotid bruits, or masses. Cardiac: Irreg, tachy, no murmurs, rubs, or gallops. No clubbing, cyanosis, 2+ R ankle edema, 1+ left ankle edema.  Radials 2+/DP/PT 1+ and equal bilaterally.  Respiratory:  Respirations regular and unlabored, diminished @ L base. GI: Soft, nontender, nondistended, BS + x 4. MS: no deformity or atrophy. Skin: warm and dry, no rash. Neuro:  Strength and sensation are intact. Psych: Normal affect.  Accessory Clinical Findings    ECG personally reviewed by me today - Sinus tachycardia, 111, freq PACs, LAD, RBBB, inf infarct  Lab Results  Component Value Date   WBC 9.7 11/27/2021   HGB 14.5 11/27/2021   HCT 42.4 11/27/2021   MCV 91.8 11/27/2021   PLT 131 (L) 11/27/2021   Lab Results  Component Value Date   CREATININE 0.88 11/24/2021   BUN 18 11/24/2021   NA 137 11/24/2021   K 3.8 11/24/2021   CL 103 11/24/2021   CO2 26 11/24/2021   Lab Results  Component Value Date   ALT 20 10/31/2021   AST 25 10/31/2021   ALKPHOS 55 10/31/2021   BILITOT 0.6 10/31/2021   Lab Results  Component Value Date   CHOL 145 11/25/2021   HDL 57 11/25/2021   LDLCALC 77 11/25/2021    TRIG 53 11/25/2021   CHOLHDL 2.5 11/25/2021    Lab Results  Component Value Date   HGBA1C 5.8 (H) 11/24/2021    Assessment & Plan    1.  Acute on chronic dyspnea:  Pt w/ recent admissions related to [place] in December 2022 followed by admission for presyncope, right lower extremity DVT, and PE earlier this  month.  He has been using oxygen at home since his hospitalization in December.  This morning, he had an episode of bendopnea, which was followed by sensation of feeling like he could not get a satisfying breath.  He was not dyspneic per se.  This went on for about an hour to 2 hours before resolving spontaneously.  He says that oxygen saturations throughout the time were in the 90s.  He has had no recurrent symptoms.  On examination, he does have diminished breath sounds in the left base and mild right greater than left lower extremity swelling.  He is otherwise euvolemic on examination.  Follow-up PA and lateral chest x-ray today.  2.  Pulmonary embolism: Diagnosed in early January with subsequent diagnosis of right lower extremity DVT in the setting of noncompliance with Eliquis.  Suspect recent PE and COVID infection in December playing a role in #1.  He has been compliant with Eliquis.   3.  Chronic respiratory failure: See above.  Remains on oxygen via nasal cannula and has seen pulmonology with follow-up plan for early next week.  Chest x-ray today in the setting of #1.  4.  Frequent PACs: Patient mildly tachycardic with frequent PACs today.  This is likely driven by recent PE.  Continue current dose of diltiazem.  Could consider increasing to 240 mg daily though pressure somewhat soft.  5.  PSVT: Quiescent.  Continue diltiazem.  See #4.  6.  COPD: No active wheezing.  On Breo Ellipta and albuterol.  Has pulmonary follow-up arranged.  7.  Diastolic dysfunction: Echo earlier this month with normal EF and grade 1 diastolic dysfunction.  Blood pressure stable/soft.  As above, heart rate  mildly elevated.  He has right and left lower extremity swelling though this appears to be stable and largely dependent in nature based on his description.  He otherwise appears euvolemic.  Follow-up chest x-ray.  I encouraged him to keep his legs elevated and avoid salt and processed foods.  I prefer not to add diuretic unless absolutely necessary.  8.  Disposition: Follow-up PA and lateral chest x-ray today.  Follow-up in clinic in 1 month or sooner if necessary.   Murray Hodgkins, NP 12/06/2021, 1:50 PM

## 2021-12-06 NOTE — Telephone Encounter (Signed)
Spoke with patients wife and patients neighbor (who is a Marine scientist). Neighbor stated that the patient was feeling very anxious and SOB. He does wear O2, his sat was 94 on RA, BP: 124/80, HR 89 (on monitor) 58 pulse manually. Patient did take 0.25 of xanax and is feeling less anxious. Neighbor stated that his pulse did feel quite irregular on palpitation. Murray Hodgkins agreed to see patient in office this afternoon at 1:30.  Patient was very grateful for the accomodation and follow up.

## 2021-12-08 ENCOUNTER — Ambulatory Visit: Payer: Medicare Other | Admitting: Cardiovascular Disease

## 2021-12-11 ENCOUNTER — Other Ambulatory Visit: Payer: Medicare Other

## 2021-12-12 ENCOUNTER — Other Ambulatory Visit: Payer: Medicare Other

## 2021-12-12 ENCOUNTER — Telehealth: Payer: Self-pay | Admitting: Cardiovascular Disease

## 2021-12-12 MED ORDER — DILTIAZEM HCL ER COATED BEADS 180 MG PO CP24: 180.0000 mg | ORAL_CAPSULE | Freq: Every day | ORAL | 3 refills | Status: DC

## 2021-12-12 NOTE — Telephone Encounter (Signed)
Patients wife picked up medications and they gave her diltiazem 120 mg pills. Patient should be taking Diltiazem 180 mg once daily. Reviewed that this could have been refill from an old prescription and that I would call pharmacy to check on that and send in updated prescription. She was appreciative for the call with no further questions.   Spoke with Vicente Males at [facility] and she confirmed they had filled an old prescription that was on file. She recommended that they bring it back in and they would take care of it.  Called patients wife back and updated her to ask for Vicente Males when she takes medication back in due to incorrect dose. No further needs at this time.

## 2021-12-12 NOTE — Telephone Encounter (Signed)
Please call, patient's wife states doseage on Diltiazem doseage was changed when he picked up this medication at his pharmacy. She states he normally takes 180 mg. The pharmacy has 120 mg

## 2021-12-16 ENCOUNTER — Emergency Department
Admission: EM | Admit: 2021-12-16 | Discharge: 2021-12-16 | Disposition: A | Discharge status: Home / Self Care | Payer: Medicare Other | Attending: Emergency Medicine | Admitting: Emergency Medicine

## 2021-12-16 ENCOUNTER — Emergency Department: Payer: Medicare Other

## 2021-12-16 ENCOUNTER — Encounter: Payer: Self-pay | Admitting: Intensive Care

## 2021-12-16 ENCOUNTER — Other Ambulatory Visit: Payer: Self-pay

## 2021-12-16 DIAGNOSIS — J449 Chronic obstructive pulmonary disease, unspecified: Secondary | ICD-10-CM | POA: Insufficient documentation

## 2021-12-16 DIAGNOSIS — R000 Tachycardia, unspecified: Secondary | ICD-10-CM | POA: Insufficient documentation

## 2021-12-16 DIAGNOSIS — R0602 Shortness of breath: Secondary | ICD-10-CM | POA: Insufficient documentation

## 2021-12-16 DIAGNOSIS — Z20822 Contact with and (suspected) exposure to covid-19: Secondary | ICD-10-CM | POA: Insufficient documentation

## 2021-12-16 DIAGNOSIS — R0789 Other chest pain: Secondary | ICD-10-CM | POA: Diagnosis not present

## 2021-12-16 DIAGNOSIS — Z86711 Personal history of pulmonary embolism: Secondary | ICD-10-CM | POA: Diagnosis not present

## 2021-12-16 DIAGNOSIS — Z7901 Long term (current) use of anticoagulants: Secondary | ICD-10-CM | POA: Diagnosis not present

## 2021-12-16 DIAGNOSIS — R079 Chest pain, unspecified: Secondary | ICD-10-CM | POA: Diagnosis present

## 2021-12-16 LAB — CBC
HCT: 39.7 % (ref 39.0–52.0)
Hemoglobin: 12.9 g/dL — ABNORMAL LOW (ref 13.0–17.0)
MCH: 31.2 pg (ref 26.0–34.0)
MCHC: 32.5 g/dL (ref 30.0–36.0)
MCV: 95.9 fL (ref 80.0–100.0)
Platelets: 177 10*3/uL (ref 150–400)
RBC: 4.14 MIL/uL — ABNORMAL LOW (ref 4.22–5.81)
RDW: 15.2 % (ref 11.5–15.5)
WBC: 9.6 10*3/uL (ref 4.0–10.5)
nRBC: 0 % (ref 0.0–0.2)

## 2021-12-16 LAB — BASIC METABOLIC PANEL
Anion gap: 8 (ref 5–15)
BUN: 15 mg/dL (ref 8–23)
CO2: 26 mmol/L (ref 22–32)
Calcium: 8.9 mg/dL (ref 8.9–10.3)
Chloride: 101 mmol/L (ref 98–111)
Creatinine, Ser: 0.87 mg/dL (ref 0.61–1.24)
GFR, Estimated: >60 mL/min (ref >60)
Glucose, Bld: 104 mg/dL — ABNORMAL HIGH (ref 70–99)
Potassium: 3.8 mmol/L (ref 3.5–5.1)
Sodium: 135 mmol/L (ref 135–145)

## 2021-12-16 LAB — TROPONIN I (HIGH SENSITIVITY)
Troponin I (High Sensitivity): 10 ng/L (ref <18)
Troponin I (High Sensitivity): 9 ng/L (ref <18)

## 2021-12-16 LAB — RESP PANEL BY RT-PCR (FLU A&B, COVID) ARPGX2
Influenza A by PCR: NEGATIVE
Influenza B by PCR: NEGATIVE
SARS Coronavirus 2 by RT PCR: NEGATIVE

## 2021-12-16 NOTE — Discharge Instructions (Addendum)
Please call your cardiologist to get a follow-up given you are not having these discomfort feelings in your chest.  Continue taking your blood thinner but if you develop any black tarry stool please return to the ER for hemoglobin recheck.  If you have any other concerns please return to the ER immediately

## 2021-12-16 NOTE — ED Triage Notes (Signed)
Patient reports chest pressure that started around 10am. Denies radiation

## 2021-12-16 NOTE — ED Provider Notes (Signed)
[facility] Provider Note    Event Date/Time   First MD Initiated Contact with Patient 12/16/21 1151     (approximate)   History   Chest Pain   HPI  Dylan Weber is a 88 y.o. male with history of PE on Eliquis, SVT, COPD who comes in with concerns for chest pain.  Patient reports having chest pain that started around 10 AM.  Patient reports at 10 AM he had had onset of some chest pressure and shortness and feeling like his body was going to explode.  It lasted about 30 minutes he was able to take some deep breaths and then it went away.  He denies any shortness of breath, leg swelling.  He reports being compliant with his Eliquis.  He reports being at his baseline self at this time and being ready to be discharged home.  Patient is chronically on 2 L of oxygen.  He states his oxygen levels are typically 90% but when he exerts himself sometimes they do drop a little bit lower.  I did review his hospital admission from 1/13 to 1/16.  At that time they increased his Cardizem due to episodes of intermittent tachycardia and patient was prescribed Eliquis for PE.   Physical Exam   Triage Vital Signs: ED Triage Vitals  Enc Vitals Group     BP 12/16/21 1144 113/83     Pulse Rate 12/16/21 1144 89     Resp 12/16/21 1144 (!) 22     Temp 12/16/21 1144 98 F (36.7 C)     Temp Source 12/16/21 1144 Oral     SpO2 12/16/21 1144 (!) 87 %     Weight 12/16/21 1141 170 lb (77.1 kg)     Height 12/16/21 1141 5\' 9"  (1.753 m)     Head Circumference --      Peak Flow --      Pain Score 12/16/21 1141 0     Pain Loc --      Pain Edu? --      Excl. in [place]? --     Most recent vital signs: Vitals:   12/16/21 1144  BP: 113/83  Pulse: 89  Resp: (!) 22  Temp: 98 F (36.7 C)  SpO2: (!) 87%     General: Awake, no distress.  CV:  Good peripheral perfusion.  No chest wall tenderness Resp:  Normal effort.  Clear lungs Abd:  No distention.  Nontender Other:  No swelling in  the legs, no calf tenderness   ED Results / Procedures / Treatments   Labs (all labs ordered are listed, but only abnormal results are displayed) Labs Reviewed  CBC - Abnormal; Notable for the following components:      Result Value   RBC 4.14 (*)    Hemoglobin 12.9 (*)    All other components within normal limits  BASIC METABOLIC PANEL  PROTIME-INR  TROPONIN I (HIGH SENSITIVITY)     EKG  My interpretation of EKG:  Normal sinus rhythm 91 without any ST elevation, T wave inversion in V3, incomplete right bundle branch block  Repeat EKG is sinus rate of 89 without any ST elevation or T wave inversions except for V3, incomplete right bundle branch block with occasional PVC  RADIOLOGY I have reviewed the xray personally and agree with radiology read, clear lungs   PROCEDURES:  Critical Care performed: No  .1-3 Lead EKG Interpretation Performed by: Vanessa Dortches, MD Authorized by: Vanessa [place], MD  Interpretation: normal     ECG rate:  90   ECG rate assessment: normal     Rhythm: sinus rhythm     Ectopy: PVCs     Conduction: normal     MEDICATIONS ORDERED IN ED: Medications - No data to display   IMPRESSION / MDM / ASSESSMENT AND PLAN / ED COURSE   Differential diagnosis includes, but is not limited to, ACS.  Will get EKG, cardiac markers.  Chest x-ray to evaluate for any pneumonia, pneumothorax.  Patient compliant with his blood thinner. He just exerted himself he states that is his new baseline since having the [place].  He does not feel any more short of breath than normal.  On his 2 L he satting 92% at rest endorses it does go down slightly when he ambulates and this is his normal.   CBC shows stable white count.  Hemoglobin slightly lower than baseline but similar to 3 weeks ago at 12.9.  Patient denies any black tarry stools. BMP is normal Initial troponin is negative COVID, flu are negative  Repeat troponin is negative.  Orthostatics were done and  these were negative.  His blood pressure is around his baseline.  He has some occasional mild tachycardia which cardiology is following him for and he is on Cardizem for.  He states that he feels at his baseline self we discussed admission given the episode of uncomfortableness in his chest versus going home and he would strongly like to go home given he feels at his baseline self.  We discussed return precautions in regards to return of any chest discomfort at this time however he feels that his normal self and would prefer discharge home  I discussed the provisional nature of ED diagnosis, the treatment so far, the ongoing plan of care, follow up appointments and return precautions with the patient and any family or support people present. They expressed understanding and agreed with the plan, discharged home.    The patient is on the cardiac monitor to evaluate for evidence of arrhythmia and/or significant heart rate changes. FINAL CLINICAL IMPRESSION(S) / ED DIAGNOSES   Final diagnoses:  Chest pain, unspecified type     Rx / DC Orders   ED Discharge Orders     None        Note:  This document was prepared using Dragon voice recognition software and may include unintentional dictation errors.   Vanessa [place], MD 12/16/21 [phone]

## 2021-12-17 ENCOUNTER — Emergency Department: Payer: Medicare Other

## 2021-12-17 ENCOUNTER — Emergency Department
Admission: EM | Admit: 2021-12-17 | Discharge: 2021-12-17 | Disposition: A | Discharge status: Home / Self Care | Payer: Medicare Other | Attending: Emergency Medicine | Admitting: Emergency Medicine

## 2021-12-17 DIAGNOSIS — J449 Chronic obstructive pulmonary disease, unspecified: Secondary | ICD-10-CM | POA: Diagnosis not present

## 2021-12-17 DIAGNOSIS — T380X5A Adverse effect of glucocorticoids and synthetic analogues, initial encounter: Secondary | ICD-10-CM | POA: Diagnosis not present

## 2021-12-17 DIAGNOSIS — Z20822 Contact with and (suspected) exposure to covid-19: Secondary | ICD-10-CM | POA: Diagnosis not present

## 2021-12-17 DIAGNOSIS — R0602 Shortness of breath: Secondary | ICD-10-CM | POA: Insufficient documentation

## 2021-12-17 LAB — BASIC METABOLIC PANEL
Anion gap: 7 (ref 5–15)
BUN: 14 mg/dL (ref 8–23)
CO2: 26 mmol/L (ref 22–32)
Calcium: 9 mg/dL (ref 8.9–10.3)
Chloride: 103 mmol/L (ref 98–111)
Creatinine, Ser: 0.88 mg/dL (ref 0.61–1.24)
GFR, Estimated: >60 mL/min (ref >60)
Glucose, Bld: 102 mg/dL — ABNORMAL HIGH (ref 70–99)
Potassium: 3.6 mmol/L (ref 3.5–5.1)
Sodium: 136 mmol/L (ref 135–145)

## 2021-12-17 LAB — BLOOD GAS, VENOUS
Acid-Base Excess: 0.1 mmol/L (ref 0.0–2.0)
Bicarbonate: 25.4 mmol/L (ref 20.0–28.0)
O2 Saturation: 65.9 %
Patient temperature: 37
pCO2, Ven: 43 mmHg — ABNORMAL LOW (ref 44.0–60.0)
pH, Ven: 7.38 (ref 7.250–7.430)
pO2, Ven: 35 mmHg (ref 32.0–45.0)

## 2021-12-17 LAB — RESP PANEL BY RT-PCR (FLU A&B, COVID) ARPGX2
Influenza A by PCR: NEGATIVE
Influenza B by PCR: NEGATIVE
SARS Coronavirus 2 by RT PCR: NEGATIVE

## 2021-12-17 LAB — CBC
HCT: 37.4 % — ABNORMAL LOW (ref 39.0–52.0)
Hemoglobin: 12.6 g/dL — ABNORMAL LOW (ref 13.0–17.0)
MCH: 31.8 pg (ref 26.0–34.0)
MCHC: 33.7 g/dL (ref 30.0–36.0)
MCV: 94.4 fL (ref 80.0–100.0)
Platelets: 172 10*3/uL (ref 150–400)
RBC: 3.96 MIL/uL — ABNORMAL LOW (ref 4.22–5.81)
RDW: 15.1 % (ref 11.5–15.5)
WBC: 7.1 10*3/uL (ref 4.0–10.5)
nRBC: 0 % (ref 0.0–0.2)

## 2021-12-17 LAB — PROTIME-INR
INR: 1.5 — ABNORMAL HIGH (ref 0.8–1.2)
Prothrombin Time: 17.8 seconds — ABNORMAL HIGH (ref 11.4–15.2)

## 2021-12-17 LAB — TROPONIN I (HIGH SENSITIVITY)
Troponin I (High Sensitivity): 14 ng/L (ref <18)
Troponin I (High Sensitivity): 12 ng/L (ref <18)

## 2021-12-17 MED ORDER — ALPRAZOLAM 0.25 MG PO TABS: 0.2500 mg | ORAL_TABLET | Freq: Three times a day (TID) | ORAL | 0 refills | Status: AC | PRN

## 2021-12-17 MED ORDER — ALPRAZOLAM 0.5 MG PO TABS
0.5000 mg | ORAL_TABLET | ORAL | Status: AC
  Administered 2021-12-17: 0.5 mg via ORAL
  Filled 2021-12-17: qty 1

## 2021-12-17 MED ORDER — IPRATROPIUM-ALBUTEROL 0.5-2.5 (3) MG/3ML IN SOLN
3.0000 mL | Freq: Once | RESPIRATORY_TRACT | Status: AC
  Administered 2021-12-17: 3 mL via RESPIRATORY_TRACT
  Filled 2021-12-17: qty 3

## 2021-12-17 MED ORDER — IOHEXOL 350 MG/ML SOLN
75.0000 mL | Freq: Once | INTRAVENOUS | Status: AC | PRN
  Administered 2021-12-17: 75 mL via INTRAVENOUS

## 2021-12-17 NOTE — ED Notes (Signed)
In to pts room, pt sitting on side of bed states that he is awake and ready to go home. Dr. Charna Archer notified.

## 2021-12-17 NOTE — ED Notes (Signed)
RT notified that a VBG was in lad for pt.

## 2021-12-17 NOTE — ED Provider Notes (Signed)
----------------------------------------- °  3:25 PM on 12/17/2021 -----------------------------------------  Blood pressure 102/74, pulse 89, temperature (!) 97.5 F (36.4 C), temperature source Oral, resp. rate 20, height 5\' 9"  (1.753 m), weight 77 kg, SpO2 100 %.  Assuming care from Dr. Jacqualine Code.  In short, Dylan Weber is a 88 y.o. male with a chief complaint of Shortness of Breath .  Refer to the original H&P for additional details.  The current plan of care is to observe until more alert following dose of Xanax for apparent anxiety. If SOB remains improved he would be appropriate for outpatient follow-up with his pulmonologist.  ----------------------------------------- 4:16 PM on 12/17/2021 ----------------------------------------- Patient is now awake and alert, requesting to be discharged home.  He states that he is feeling much better and shortness of breath has resolved.  He was COUNSELED to follow-up closely with his pulmonologist and to return to the ED for new worsening symptoms, patient and spouse agree with plan.    Blake Divine, MD 12/17/21 [phone]

## 2021-12-17 NOTE — Discharge Instructions (Signed)
Please follow-up closely with your pulmonologist, call tomorrow to set up a follow-up visit.  I do recommend at this point that you stop taking your prednisone, but please call your pulmonologist tomorrow to inform them.  I have also sent them a message  Return to the ER right away should you develop difficulty breathing, oxygen levels especially any less than 90% on your home pulse oximeter, chest pain, weakness fatigue fever or other new concerns arise  Do not take alprazolam and drive a car or put your self any sort of dangerous situations such as up on a ladder or place that you get fall or get injured as it may cause dizziness and fatigue

## 2021-12-17 NOTE — ED Triage Notes (Signed)
Pt comes pov from home with SOB, panic, hot flashes. Was here yesterday for chest pain. States he feels like he needs a sedative. Pt chronic 2L. On 2.5L right now.

## 2021-12-17 NOTE — ED Provider Notes (Signed)
[facility] Provider Note    Event Date/Time   First MD Initiated Contact with Patient 12/17/21 1020     (approximate)   History   Shortness of Breath   HPI  Dylan Weber is a 88 y.o. male  COPD, GERD, SVT, recent admission for COVID-19 infection discharged on 2 L oxygen, PE, who presents with ongoing feeling of shortness of breath  Patient reports he seen his ER yesterday for roughly the same thing.  He was okay when he went home, and then reports last night and this morning he is still continuing to feel short of breath.  He feels like he is starting to feel warm in his chest and like he cannot catch his breath.  Patient feels like he is having panic attacks or something.  He took a 0.25 mg of a alprazolam tablet this morning and it helped briefly  He remains compliant with his medications including his anticoagulant.  He has had recent COVID-19 and since his pulmonary embolism  He does have a history of COPD.  He has not noticed that he has been particularly coughing having any fevers.  He does not feel like he is wheezing      Physical Exam   Triage Vital Signs: ED Triage Vitals  Enc Vitals Group     BP 12/17/21 1014 133/85     Pulse Rate 12/17/21 1014 93     Resp 12/17/21 1014 18     Temp 12/17/21 1014 (!) 97.5 F (36.4 C)     Temp Source 12/17/21 1014 Oral     SpO2 12/17/21 1014 99 %     Weight 12/17/21 1006 169 lb 12.1 oz (77 kg)     Height 12/17/21 1006 5\' 9"  (1.753 m)     Head Circumference --      Peak Flow --      Pain Score 12/17/21 1006 0     Pain Loc --      Pain Edu? --      Excl. in [place]? --     Most recent vital signs: Vitals:   12/17/21 1115 12/17/21 1130  BP: 112/70 125/73  Pulse: 78 79  Resp: (!) 21 17  Temp:    SpO2: 90% 93%     General: Awake, sitting upright, appears anxious.  Mild increased work of breathing is evident CV:  Good peripheral perfusion.  Heart tones are normal.  Occasional irregularity of sinus  rhythm Resp:  Mild tachypnea and mild accessory muscle use.  Diminished lung sounds in the bases bilaterally.  No noted wheezing.  No noted cough. Abd:  No distention.  Other:  Very mild edema of the feet only bilateral.  Patient states his cardiologist told him this is due to his diltiazem   ED Results / Procedures / Treatments   Labs (all labs ordered are listed, but only abnormal results are displayed) Labs Reviewed  CBC - Abnormal; Notable for the following components:      Result Value   RBC 3.96 (*)    Hemoglobin 12.6 (*)    HCT 37.4 (*)    All other components within normal limits  BASIC METABOLIC PANEL - Abnormal; Notable for the following components:   Glucose, Bld 102 (*)    All other components within normal limits  PROTIME-INR - Abnormal; Notable for the following components:   Prothrombin Time 17.8 (*)    INR 1.5 (*)    All other components within normal limits  BLOOD GAS, VENOUS - Abnormal; Notable for the following components:   pCO2, Ven 43 (*)    All other components within normal limits  RESP PANEL BY RT-PCR (FLU A&B, COVID) ARPGX2  TROPONIN I (HIGH SENSITIVITY)  TROPONIN I (HIGH SENSITIVITY)     EKG  Reviewed inter by me at 1015 Heart rate 109 QRS 120 QTC 460 Borderline sinus tachycardia.  First-degree AV block.  Somewhat incomplete right bundle branch block like pattern, but not quite.  Occasional PVC.  Mild nonspecific T wave abnormality.   RADIOLOGY  Personally viewed the patient's CT angiography of the chest, interpreted grossly for acute abnormality.  I do not see that he evidence of obvious large pulmonary embolism  Defer to radiologist report as below  CT Angio Chest PE W and/or Wo Contrast IMPRESSION: 1. No evidence for acute pulmonary embolism. 2. Coronary artery calcifications noted. 3. Aortic Atherosclerosis (ICD10-I70.0) and Emphysema (ICD10-J43.9). Electronically Signed   By: Kerby Moors M.D.   On: 12/17/2021 12:28        PROCEDURES:  Critical Care performed: No  Procedures   MEDICATIONS ORDERED IN ED: Medications  ALPRAZolam (XANAX) tablet 0.5 mg (0.5 mg Oral Given 12/17/21 1053)  ipratropium-albuterol (DUONEB) 0.5-2.5 (3) MG/3ML nebulizer solution 3 mL (3 mLs Nebulization Given 12/17/21 1119)  iohexol (OMNIPAQUE) 350 MG/ML injection 75 mL (75 mLs Intravenous Contrast Given 12/17/21 1203)     IMPRESSION / MDM / ASSESSMENT AND PLAN / ED COURSE  I reviewed the triage vital signs and the nursing notes.                              Differential diagnosis includes, but is not limited to, pulmonary embolism, COPD exacerbation, anxiety, ACS etc.  Patient was seen and evaluated yesterday for same, he had extensive work-up without evidence of acute coronary syndrome.  Today reports shortness of breath.  Given his history of pulmonary embolism though he is anticoagulated and given his recurrence of symptomatology will proceed with CT angiography.  Wish to exclude pulmonary embolism.  EKG does not show obvious evidence of frank ischemia and his EKG shows similar morphology to his EKG from yesterday  I will provide an anxiolytic, also trial a DuoNeb to see if this helps with any of his symptomatology though he does not have frank wheezing coughing or obvious symptoms of bronchitis or symptoms that would suggest a obvious COPD exacerbation, but somewhat hard to exclude at this point given his representation and symptomatology.    Patient maintained on cardiac monitor to evaluate for arrhythmias   Clinical Course as of 12/17/21 1538  Sun Dec 17, 2021  1153 Patient resting comfortably, reports he feels a lot better after taking the alprazolam.  His breathing feels back to normal. [MQ]  1153 Patient ambulatory back and forth to the bathroom on 2 L nasal cannula reports feeling much improved [MQ]  1440 Patient is resting.  Wife reports that since receiving the alprazolam he does seem sort of sleepy.  He alerts to  voice maintains conversation, but does feel sleepy.  Does not appear in any distress though, suspect consistent with mild sedative effect from the alprazolam.  We will observe for an additional period of time 1 to 2 hours, await return to normal level of alertness.  Would consider discharge once alert, oriented, and not feeling quite as sleepy.  He does not appear however oversedated at this time.  Patient and his wife  are comfortable with plan for discharge, but do wish to wait a little bit longer as he feels rather sleepy after receiving alprazolam. [MQ]    Clinical Course User Index [MQ] Delman Kitten, MD   Personally reviewed the patient's discharge summary from November 27, 2021, evaluated for syncope.  Reviewed previous echocardiogram from January 14, normal EF with normal left ventricular function.  ----------------------------------------- 1:07 PM on 12/17/2021 ----------------------------------------- Patient resting very comfortably no distress.  Lung sounds clear.  Reports he feels much [place] he does not feel short of breath.  He appears much calmer.  Discussed with him and his wife's results to this point, thus far very reassuring.  I do not see evidence of acute ACS pulmonary embolism or obvious acute infectious etiology.  I somewhat doubt acute a exacerbation of COPD as well.  I did discuss with him his wife reports that the symptoms seem to have started not long after he started on prednisone, and she noticed that he has been anxious and he reports he did not sleep at all last night was able to fall asleep.  I suspect that prednisone may be somewhat driving this.  I sent a message to Dr. Lanney Gins at this point patient will plan to discontinue his prednisone, call pulmonologist office tomorrow  Second troponin normal.  Will discharge patient to follow-up with Dr. Lanney Gins (inbasket message sent with update and reqeust for close ER follow-up).  Discussed precautions around use of alprazolam  with patient.  He is not driving.  He has previously had a prescription for alprazolam but only has 1 tablet left at this point.  Return precautions and treatment recommendations and follow-up discussed with the patient who is agreeable with the plan.  ----------------------------------------- 2:19 PM on 12/17/2021 ----------------------------------------- Labs reviewed including normal troponin, 2x.  He appears comfortable much improved without distress no hypoxia normal work of breathing at this time.  Ongoing care assigned to Dr. Charna Archer, follow-up on reevaluations.  At this point patient improved, but slightly sedate secondary to presumed alprazolam.  FINAL CLINICAL IMPRESSION(S) / ED DIAGNOSES   Final diagnoses:  SOB (shortness of breath)  Steroid side effects, initial encounter     Rx / DC Orders   ED Discharge Orders          Ordered    ALPRAZolam (XANAX) 0.25 MG tablet  3 times daily PRN        12/17/21 1311             Note:  This document was prepared using Dragon voice recognition software and may include unintentional dictation errors.   Delman Kitten, MD 12/17/21 1540

## 2021-12-18 ENCOUNTER — Ambulatory Visit: Payer: Medicare Other | Admitting: Cardiovascular Disease

## 2021-12-31 ENCOUNTER — Emergency Department: Payer: Medicare Other

## 2021-12-31 ENCOUNTER — Other Ambulatory Visit: Payer: Self-pay

## 2021-12-31 ENCOUNTER — Inpatient Hospital Stay
Admission: EM | Admit: 2021-12-31 | Discharge: 2022-01-02 | DRG: 190 | Disposition: A | Discharge status: Home / Self Care | Payer: Medicare Other | Attending: Internal Medicine | Admitting: Internal Medicine

## 2021-12-31 ENCOUNTER — Encounter: Payer: Self-pay | Admitting: Emergency Medicine

## 2021-12-31 DIAGNOSIS — Z86718 Personal history of other venous thrombosis and embolism: Secondary | ICD-10-CM | POA: Diagnosis not present

## 2021-12-31 DIAGNOSIS — J9621 Acute and chronic respiratory failure with hypoxia: Secondary | ICD-10-CM | POA: Diagnosis present

## 2021-12-31 DIAGNOSIS — Z882 Allergy status to sulfonamides status: Secondary | ICD-10-CM

## 2021-12-31 DIAGNOSIS — F10239 Alcohol dependence with withdrawal, unspecified: Secondary | ICD-10-CM | POA: Diagnosis not present

## 2021-12-31 DIAGNOSIS — Z87891 Personal history of nicotine dependence: Secondary | ICD-10-CM

## 2021-12-31 DIAGNOSIS — R0902 Hypoxemia: Secondary | ICD-10-CM

## 2021-12-31 DIAGNOSIS — Z79899 Other long term (current) drug therapy: Secondary | ICD-10-CM | POA: Diagnosis not present

## 2021-12-31 DIAGNOSIS — I5189 Other ill-defined heart diseases: Secondary | ICD-10-CM

## 2021-12-31 DIAGNOSIS — J441 Chronic obstructive pulmonary disease with (acute) exacerbation: Secondary | ICD-10-CM | POA: Diagnosis present

## 2021-12-31 DIAGNOSIS — Z9981 Dependence on supplemental oxygen: Secondary | ICD-10-CM

## 2021-12-31 DIAGNOSIS — K219 Gastro-esophageal reflux disease without esophagitis: Secondary | ICD-10-CM | POA: Diagnosis present

## 2021-12-31 DIAGNOSIS — Z7951 Long term (current) use of inhaled steroids: Secondary | ICD-10-CM | POA: Diagnosis not present

## 2021-12-31 DIAGNOSIS — Z8616 Personal history of COVID-19: Secondary | ICD-10-CM | POA: Diagnosis not present

## 2021-12-31 DIAGNOSIS — Z8611 Personal history of tuberculosis: Secondary | ICD-10-CM | POA: Diagnosis not present

## 2021-12-31 DIAGNOSIS — J9620 Acute and chronic respiratory failure, unspecified whether with hypoxia or hypercapnia: Secondary | ICD-10-CM | POA: Diagnosis present

## 2021-12-31 DIAGNOSIS — Z7901 Long term (current) use of anticoagulants: Secondary | ICD-10-CM

## 2021-12-31 DIAGNOSIS — J449 Chronic obstructive pulmonary disease, unspecified: Secondary | ICD-10-CM

## 2021-12-31 DIAGNOSIS — I2510 Atherosclerotic heart disease of native coronary artery without angina pectoris: Secondary | ICD-10-CM | POA: Diagnosis present

## 2021-12-31 DIAGNOSIS — Z20822 Contact with and (suspected) exposure to covid-19: Secondary | ICD-10-CM | POA: Diagnosis present

## 2021-12-31 DIAGNOSIS — Z888 Allergy status to other drugs, medicaments and biological substances status: Secondary | ICD-10-CM | POA: Diagnosis not present

## 2021-12-31 DIAGNOSIS — D696 Thrombocytopenia, unspecified: Secondary | ICD-10-CM | POA: Diagnosis present

## 2021-12-31 DIAGNOSIS — Z880 Allergy status to penicillin: Secondary | ICD-10-CM

## 2021-12-31 DIAGNOSIS — Z86711 Personal history of pulmonary embolism: Secondary | ICD-10-CM

## 2021-12-31 DIAGNOSIS — I2699 Other pulmonary embolism without acute cor pulmonale: Secondary | ICD-10-CM | POA: Diagnosis present

## 2021-12-31 LAB — COMPREHENSIVE METABOLIC PANEL
ALT: 21 U/L (ref 0–44)
AST: 24 U/L (ref 15–41)
Albumin: 4.1 g/dL (ref 3.5–5.0)
Alkaline Phosphatase: 64 U/L (ref 38–126)
Anion gap: 12 (ref 5–15)
BUN: 14 mg/dL (ref 8–23)
CO2: 26 mmol/L (ref 22–32)
Calcium: 9.2 mg/dL (ref 8.9–10.3)
Chloride: 100 mmol/L (ref 98–111)
Creatinine, Ser: 1.05 mg/dL (ref 0.61–1.24)
GFR, Estimated: >60 mL/min (ref >60)
Glucose, Bld: 142 mg/dL — ABNORMAL HIGH (ref 70–99)
Potassium: 3.4 mmol/L — ABNORMAL LOW (ref 3.5–5.1)
Sodium: 138 mmol/L (ref 135–145)
Total Bilirubin: 1 mg/dL (ref 0.3–1.2)
Total Protein: 7.8 g/dL (ref 6.5–8.1)

## 2021-12-31 LAB — CBC
HCT: 40.7 % (ref 39.0–52.0)
Hemoglobin: 13 g/dL (ref 13.0–17.0)
MCH: 31 pg (ref 26.0–34.0)
MCHC: 31.9 g/dL (ref 30.0–36.0)
MCV: 97.1 fL (ref 80.0–100.0)
Platelets: 208 10*3/uL (ref 150–400)
RBC: 4.19 MIL/uL — ABNORMAL LOW (ref 4.22–5.81)
RDW: 16.2 % — ABNORMAL HIGH (ref 11.5–15.5)
WBC: 6.3 10*3/uL (ref 4.0–10.5)
nRBC: 0 % (ref 0.0–0.2)

## 2021-12-31 LAB — BLOOD GAS, ARTERIAL
Acid-Base Excess: 1.6 mmol/L (ref 0.0–2.0)
Bicarbonate: 24.6 mmol/L (ref 20.0–28.0)
O2 Content: 15 L/min
O2 Saturation: 92.9 %
Patient temperature: 37
pCO2 arterial: 33 mmHg (ref 32–48)
pH, Arterial: 7.48 — ABNORMAL HIGH (ref 7.35–7.45)
pO2, Arterial: 61 mmHg — ABNORMAL LOW (ref 83–108)

## 2021-12-31 LAB — BRAIN NATRIURETIC PEPTIDE: B Natriuretic Peptide: 65.3 pg/mL (ref 0.0–100.0)

## 2021-12-31 LAB — TROPONIN I (HIGH SENSITIVITY): Troponin I (High Sensitivity): 9 ng/L (ref <18)

## 2021-12-31 MED ORDER — GUAIFENESIN ER 600 MG PO TB12
600.0000 mg | ORAL_TABLET | Freq: Every day | ORAL | Status: DC
Start: 2021-12-31 — End: 2022-01-02
  Administered 2022-01-01 – 2022-01-02 (×2): 600 mg via ORAL
  Filled 2021-12-31 (×2): qty 1

## 2021-12-31 MED ORDER — ASCORBIC ACID 500 MG PO TABS
500.0000 mg | ORAL_TABLET | Freq: Every day | ORAL | Status: DC
  Administered 2022-01-01 – 2022-01-02 (×2): 500 mg via ORAL
  Filled 2021-12-31 (×2): qty 1

## 2021-12-31 MED ORDER — PREDNISONE 20 MG PO TABS
40.0000 mg | ORAL_TABLET | Freq: Every day | ORAL | Status: DC
  Administered 2022-01-02: 40 mg via ORAL
  Filled 2021-12-31: qty 2

## 2021-12-31 MED ORDER — ZINC SULFATE 220 (50 ZN) MG PO CAPS
220.0000 mg | ORAL_CAPSULE | Freq: Every day | ORAL | Status: DC
  Administered 2022-01-01 – 2022-01-02 (×2): 220 mg via ORAL
  Filled 2021-12-31 (×2): qty 1

## 2021-12-31 MED ORDER — METHYLPREDNISOLONE SODIUM SUCC 125 MG IJ SOLR: 60.0000 mg | Freq: Two times a day (BID) | INTRAMUSCULAR | Status: AC

## 2021-12-31 MED ORDER — ONDANSETRON HCL 4 MG/2ML IJ SOLN: 4.0000 mg | Freq: Four times a day (QID) | INTRAMUSCULAR | Status: DC | PRN

## 2021-12-31 MED ORDER — DILTIAZEM HCL ER COATED BEADS 180 MG PO CP24
180.0000 mg | ORAL_CAPSULE | Freq: Every day | ORAL | Status: DC
Start: 2022-01-01 — End: 2022-01-02
  Administered 2022-01-01 – 2022-01-02 (×2): 180 mg via ORAL
  Filled 2021-12-31 (×2): qty 1

## 2021-12-31 MED ORDER — SODIUM CHLORIDE 0.9% FLUSH
3.0000 mL | Freq: Two times a day (BID) | INTRAVENOUS | Status: DC
  Administered 2022-01-01 – 2022-01-02 (×3): 3 mL via INTRAVENOUS

## 2021-12-31 MED ORDER — METHYLPREDNISOLONE SODIUM SUCC 125 MG IJ SOLR
125.0000 mg | Freq: Once | INTRAMUSCULAR | Status: AC
  Administered 2021-12-31: 125 mg via INTRAVENOUS
  Filled 2021-12-31: qty 2

## 2021-12-31 MED ORDER — IOHEXOL 350 MG/ML SOLN
75.0000 mL | Freq: Once | INTRAVENOUS | Status: AC | PRN
  Administered 2021-12-31: 75 mL via INTRAVENOUS

## 2021-12-31 MED ORDER — APIXABAN 5 MG PO TABS
5.0000 mg | ORAL_TABLET | Freq: Two times a day (BID) | ORAL | Status: DC
  Administered 2022-01-01 – 2022-01-02 (×4): 5 mg via ORAL
  Filled 2021-12-31 (×4): qty 1

## 2021-12-31 MED ORDER — FLUTICASONE FUROATE-VILANTEROL 200-25 MCG/ACT IN AEPB
1.0000 | INHALATION_SPRAY | Freq: Every day | RESPIRATORY_TRACT | Status: DC
  Administered 2022-01-01: 1 via RESPIRATORY_TRACT
  Filled 2021-12-31: qty 28

## 2021-12-31 MED ORDER — IPRATROPIUM-ALBUTEROL 0.5-2.5 (3) MG/3ML IN SOLN
3.0000 mL | Freq: Four times a day (QID) | RESPIRATORY_TRACT | Status: DC
  Administered 2021-12-31 – 2022-01-02 (×7): 3 mL via RESPIRATORY_TRACT
  Filled 2021-12-31 (×7): qty 3

## 2021-12-31 MED ORDER — ACETAMINOPHEN 500 MG PO TABS
500.0000 mg | ORAL_TABLET | Freq: Four times a day (QID) | ORAL | Status: DC | PRN
  Administered 2022-01-02: 500 mg via ORAL
  Filled 2021-12-31: qty 1

## 2021-12-31 MED ORDER — IPRATROPIUM-ALBUTEROL 0.5-2.5 (3) MG/3ML IN SOLN
3.0000 mL | Freq: Once | RESPIRATORY_TRACT | Status: AC
  Administered 2021-12-31: 3 mL via RESPIRATORY_TRACT
  Filled 2021-12-31: qty 3

## 2021-12-31 MED ORDER — SODIUM CHLORIDE 0.9 % IV SOLN
500.0000 mg | INTRAVENOUS | Status: DC
  Administered 2021-12-31 – 2022-01-01 (×2): 500 mg via INTRAVENOUS
  Filled 2021-12-31 (×3): qty 5

## 2021-12-31 MED ORDER — HYDRALAZINE HCL 20 MG/ML IJ SOLN: 5.0000 mg | INTRAMUSCULAR | Status: DC | PRN

## 2021-12-31 MED ORDER — GUAIFENESIN ER 600 MG PO TB12: 600.0000 mg | ORAL_TABLET | Freq: Two times a day (BID) | ORAL | Status: DC | PRN

## 2021-12-31 MED ORDER — MELATONIN 5 MG PO TABS
5.0000 mg | ORAL_TABLET | Freq: Every evening | ORAL | Status: DC | PRN
  Administered 2022-01-01 (×2): 5 mg via ORAL
  Filled 2021-12-31 (×2): qty 1

## 2021-12-31 MED ORDER — LACTATED RINGERS IV SOLN: INTRAVENOUS | Status: AC

## 2021-12-31 MED ORDER — BISACODYL 5 MG PO TBEC: 5.0000 mg | DELAYED_RELEASE_TABLET | Freq: Every day | ORAL | Status: DC | PRN

## 2021-12-31 MED ORDER — DOCUSATE SODIUM 100 MG PO CAPS
100.0000 mg | ORAL_CAPSULE | Freq: Two times a day (BID) | ORAL | Status: DC
  Administered 2022-01-01 – 2022-01-02 (×3): 100 mg via ORAL
  Filled 2021-12-31 (×3): qty 1

## 2021-12-31 MED ORDER — ALBUTEROL SULFATE (2.5 MG/3ML) 0.083% IN NEBU: 3.0000 mL | INHALATION_SOLUTION | Freq: Four times a day (QID) | RESPIRATORY_TRACT | Status: DC | PRN

## 2021-12-31 MED ORDER — POLYETHYLENE GLYCOL 3350 17 G PO PACK: 17.0000 g | PACK | Freq: Every day | ORAL | Status: DC | PRN

## 2021-12-31 MED ORDER — ONDANSETRON HCL 4 MG PO TABS: 4.0000 mg | ORAL_TABLET | Freq: Four times a day (QID) | ORAL | Status: DC | PRN

## 2021-12-31 MED ORDER — MORPHINE SULFATE (PF) 2 MG/ML IV SOLN: 2.0000 mg | INTRAVENOUS | Status: DC | PRN

## 2021-12-31 NOTE — Assessment & Plan Note (Addendum)
Continue eliquis.  This was seen on CT scan in January 2023

## 2021-12-31 NOTE — Assessment & Plan Note (Addendum)
Continue solumedrol/ prednisone.  duoneb and PRN mucinex. Continue venotlin and breo ellipta. Empiric Zithromax

## 2021-12-31 NOTE — ED Provider Notes (Signed)
[facility] Provider Note    Event Date/Time   First MD Initiated Contact with Patient 12/31/21 1558     (approximate)  History   Chief Complaint: Respiratory Distress  HPI  Dylan Weber is a 88 y.o. male with a past medical history of COPD, COVID infection 2 months ago, CHF, prior PE 1/23 now on Eliquis, presents to the emergency department for shortness of breath.  Patient typically wears 2 L of oxygen at home 24/7.  Over the past several days patient has been more short of breath, he increased his oxygen to 4 L but continued to sat 82% on 4 L so the patient came to the emergency department.  Upon arrival patient continues to sat in the low 80s, placed on high flow nasal cannula oxygen at 12 L/min currently satting around 90%.  Patient denies any chest pain.  Slight cough but not increased over baseline.  No fever although the patient does state chills at times.  Physical Exam   Triage Vital Signs: ED Triage Vitals  Enc Vitals Group     BP 12/31/21 1543 118/80     Pulse Rate 12/31/21 1543 (!) 112     Resp 12/31/21 1543 (!) 26     Temp 12/31/21 1543 98 F (36.7 C)     Temp Source 12/31/21 1543 Oral     SpO2 12/31/21 1543 (!) 84 %     Weight 12/31/21 1542 170 lb (77.1 kg)     Height 12/31/21 1542 5\' 9"  (1.753 m)     Head Circumference --      Peak Flow --      Pain Score 12/31/21 1538 0     Pain Loc --      Pain Edu? --      Excl. in [place]? --     Most recent vital signs: Vitals:   12/31/21 1543  BP: 118/80  Pulse: (!) 112  Resp: (!) 26  Temp: 98 F (36.7 C)  SpO2: (!) 84%    General: Awake, no distress.  CV:  Good peripheral perfusion.  Regular rate and rhythm  Resp:  Moderate tachypnea around 25 to 30 breaths/min.  Diminished breath sounds bilaterally with mild expiratory wheeze bilaterally. Abd:  No distention.  Soft, nontender.  No rebound or guarding. Other:  No significant lower extremity edema   ED Results / Procedures / Treatments    EKG  EKG viewed and interpreted by myself shows sinus tachycardia 106 bpm with a narrow QRS, normal axis, normal intervals, nonspecific ST changes without ST elevation.  RADIOLOGY  I personally reviewed the CT images, no significant abnormality seen on my evaluation.  Radiology is read the CT negative for PE although some motion artifact. Chest x-ray is negative besides mild emphysema.   MEDICATIONS ORDERED IN ED: Medications  methylPREDNISolone sodium succinate (SOLU-MEDROL) 125 mg/2 mL injection 125 mg (has no administration in time range)  ipratropium-albuterol (DUONEB) 0.5-2.5 (3) MG/3ML nebulizer solution 3 mL (has no administration in time range)  ipratropium-albuterol (DUONEB) 0.5-2.5 (3) MG/3ML nebulizer solution 3 mL (has no administration in time range)     IMPRESSION / MDM / ASSESSMENT AND PLAN / ED COURSE  I reviewed the triage vital signs and the nursing notes.  Patient presents to the emergency department for shortness of breath.  Satting 82% on 4 L typically on 2 L of oxygen.  Patient currently on 12 L/min high flow humidified oxygen satting around 90%.  No chest pain.  Recent COVID 2 months ago.  Recent small PE in January.  Differential would include infectious etiology such as pneumonia, COVID or influenza.  Would include CHF such as pulmonary edema/fluid overload, pneumothorax, COPD exacerbation or new PE.  We will begin with labs, chest x-ray.  Patient is in no distress but only speaking in 1-2 word sentences for the most part answers questions by shaking his head over the wife helps.  No significant lower extremity edema.  We will dose 125 mg of IV Solu-Medrol as well as 2 DuoNebs continue with high flow and reassess.  Patient agreeable to plan.  Given the patient's hypoxia he will require admission to the hospitalist once his emergency department work-up is been completed.  CTA is negative for PE.  Chest x-ray is negative.  Lab work is largely reassuring.  Troponin is  negative.  Chemistry is normal with a normal creatinine.  CBC is normal with a normal white blood cell count.  BNP is normal.  However given the patient's hypoxia currently requiring 15 L on high flow humidified nasal cannula we will admit to the hospital service for further work-up and treatment for likely COPD exacerbation.  Family and patient agreeable to plan  FINAL CLINICAL IMPRESSION(S) / ED DIAGNOSES   Dyspnea Hypoxia COPD exacerbation  Note:  This document was prepared using Dragon voice recognition software and may include unintentional dictation errors.   Harvest Dark, MD 12/31/21 [phone]

## 2021-12-31 NOTE — ED Notes (Signed)
Posey Pronto, MD at bedside aware of pt vital signs.

## 2021-12-31 NOTE — Assessment & Plan Note (Addendum)
On 15 L HFNC.  Wean as able.  Pulmonary consult CTA negative for any PE or infiltrate.  It did show severe emphysema

## 2021-12-31 NOTE — Assessment & Plan Note (Signed)
IV protonix  

## 2021-12-31 NOTE — Assessment & Plan Note (Signed)
Stable no chest pain. Troponin negative.

## 2021-12-31 NOTE — H&P (Signed)
History and Physical    Patient: Dylan Weber [id number]:[id number] DOB: Aug 22, 1933 DOA: 12/31/2021 DOS: the patient was seen and examined on 12/31/2021 PCP: Peggye Form, NP  Patient coming from: Home  Chief Complaint:  Chief Complaint  Patient presents with   Respiratory Distress    HPI: Dylan Weber is a 88 y.o. male with medical history significant of COPD/ Cad/ PE coming with SOB since past few days and getting worse. He is on  4L at baseline.  But his o2 sat was 82% on 4 L. Pt sees Dr. Lanney Gins and was last seen recently on 12/29/2021.  Review of Systems:  Review of Systems  Reason unable to perform ROS: pt is on High flow tacycardic and mild distress.  Constitutional:  Positive for malaise/fatigue.  Respiratory:  Positive for cough, shortness of breath and wheezing.    Past Medical History:  Diagnosis Date   COPD (chronic obstructive pulmonary disease) ([place])    Coronary artery calcification seen on CT scan 11/17/2021   COVID-19 28/4132   Diastolic dysfunction    a. 11/2021 Echo: EF 55-60%, no rwma, GrI DD, nl RV fxn. Mild AI.   GERD (gastroesophageal reflux disease)    Pulmonary embolism ([place])    a. 11/2021 CTA Chest: small peripheral PE in RLL branch of PA-->Eliquis.   Right leg DVT ([place])    a. 11/2021 LE U/S: occlusive R calf vein DVT in R peroneal vein-->eliquis.   SVT (supraventricular tachycardia) ([place])    a. Dx 10/2021 in setting of COVID infxn.   Past Surgical History:  Procedure Laterality Date   APPENDECTOMY     CHOLECYSTECTOMY     Social History:  reports that he has quit smoking. His smoking use included cigarettes. He has quit using smokeless tobacco. He reports current alcohol use of about 7.0 standard drinks per week. He reports that he does not use drugs.  Allergies  Allergen Reactions   Sulfamethoxazole-Trimethoprim Rash    Family History  Problem Relation Age of Onset   Uterine cancer Mother    Brain cancer Sister     Prior to Admission  medications   Medication Sig Start Date End Date Taking? Authorizing Provider  acetaminophen (TYLENOL) 650 MG CR tablet Take 650 mg by mouth as needed for pain.    [provider]  albuterol (VENTOLIN HFA) 108 (90 Base) MCG/ACT inhaler Inhale 2 puffs into the lungs every 6 (six) hours as needed for wheezing or shortness of breath. 11/01/21   Loletha Grayer, MD  ALPRAZolam Duanne Moron) 0.25 MG tablet Take 1 tablet (0.25 mg total) by mouth 3 (three) times daily as needed for anxiety. 12/17/21 12/17/22  Delman Kitten, MD  amoxicillin (AMOXIL) 250 MG capsule Take 250 mg by mouth 3 (three) times daily. 12/15/21   [provider]  apixaban (ELIQUIS) 5 MG TABS tablet Take 2 tablets (10 mg total) by mouth 2 (two) times daily for 6 days, THEN 1 tablet (5 mg total) 2 (two) times daily. 11/27/21 02/25/22  Sidney Ace, MD  ascorbic acid (VITAMIN C) 500 MG tablet Take 1 tablet (500 mg total) by mouth daily. 11/02/21   Loletha Grayer, MD  bacitracin ointment Apply topically 2 (two) times daily. 12/11/21   [provider]  BREO ELLIPTA 200-25 MCG/ACT AEPB Inhale 1 puff into the lungs daily. 10/12/21   [provider]  diltiazem (CARDIZEM CD) 180 MG 24 hr capsule Take 1 capsule (180 mg total) by mouth daily. 12/12/21 01/11/22  Theora Gianotti, NP  guaiFENesin (MUCINEX) 600 MG 12 hr tablet Take 600 mg by mouth daily.    [provider]  hydrOXYzine (ATARAX) 10 MG tablet Take 10 mg by mouth at bedtime. Patient not taking: Reported on 12/17/2021 11/07/21   [provider]  Multiple Vitamin (MULTIVITAMIN) capsule Take 1 capsule by mouth daily.    [provider]  pantoprazole (PROTONIX) 40 MG tablet Take 40 mg by mouth daily. 09/20/21   [provider]  pseudoephedrine-acetaminophen (TYLENOL SINUS) 30-500 MG TABS tablet Take 1 tablet by mouth as needed.    [provider]  sulfamethoxazole-trimethoprim (BACTRIM) 400-80 MG tablet Take 1  tablet by mouth 3 (three) times a week. Patient not taking: Reported on 12/17/2021 12/11/21   [provider]  traZODone (DESYREL) 100 MG tablet Take 100 mg by mouth at bedtime. Patient not taking: Reported on 12/17/2021 11/09/21   [provider]  zinc sulfate 220 (50 Zn) MG capsule Take 1 capsule (220 mg total) by mouth daily. 11/02/21   Loletha Grayer, MD    Physical Exam: Vitals:   12/31/21 1648 12/31/21 1700 12/31/21 1730 12/31/21 1800  BP:  (!) 151/130 (!) 105/93 118/81  Pulse:   (!) 121 (!) 120  Resp:  18 (!) 22 (!) 21  Temp:      TempSrc:      SpO2: 91% 93% 91% 91%  Weight:      Height:      Physical Exam Vitals reviewed.  Constitutional:      General: He is in acute distress.     Appearance: He is ill-appearing.  HENT:     Head: Normocephalic and atraumatic.     Right Ear: External ear normal.     Left Ear: External ear normal.     Nose: Nose normal.     Mouth/Throat:     Mouth: Mucous membranes are moist.  Eyes:     Extraocular Movements: Extraocular movements intact.     Pupils: Pupils are equal, round, and reactive to light.  Cardiovascular:     Rate and Rhythm: Normal rate and regular rhythm.     Pulses: Normal pulses.     Heart sounds: Normal heart sounds.  Pulmonary:     Effort: Tachypnea and accessory muscle usage present.     Breath sounds: Wheezing present.  Abdominal:     General: There is no distension.     Palpations: There is no mass.     Tenderness: There is no abdominal tenderness. There is no guarding.     Hernia: No hernia is present.  Musculoskeletal:     Right lower leg: No edema.     Left lower leg: No edema.  Skin:    General: Skin is warm.  Neurological:     General: No focal deficit present.     Mental Status: He is alert.     Cranial Nerves: Cranial nerves 2-12 are intact.  Psychiatric:        Mood and Affect: Mood normal.     Data Reviewed: > CMP shows potassium of 3.4 and glucose of 142 and otherwise  normal. > BNP of 65.3 / TNI of 9. > CBC shows normal wbc/ hb and platelet count. >  Resp panel negative for covid on th 5ht of feb.  > CTA Chest: 1. Examination for pulmonary embolism is somewhat limited by breath motion artifact particularly in the bilateral lung bases. Within this limitation, no evidence of pulmonary embolism through the segmental pulmonary arterial level. 2.  Severe emphysema. 3. Coronary artery disease.   Assessment and Plan: * Acute on chronic respiratory failure with hypoxia ([place])- (present on admission) We will admit to progressive unit. Suspect pt may ned NIPPV. ABG pending. CTA negative for any PE or infiltrate. Bedrest and light diet.    COPD with acute exacerbation ([place])- (present on admission) Continue solumedrol/ prednisone.  duoneb and PRN mucinex. Continue venotlin and breo ellipta.    Pulmonary embolism ([place])- (present on admission) Continue eliquis.  Gastroesophageal reflux disease without esophagitis- (present on admission) IV protonix.    CAD (coronary artery disease)- (present on admission) Stable no chest pain. Troponin negative.      Advance Care Planning:   Code Status: Full Code   Consults:  None   Family Communication:  Spieth,CYNTHIA (Spouse)  [phone] ([place])  Severity of Illness: The appropriate patient status for this patient is INPATIENT. Inpatient status is judged to be reasonable and necessary in order to provide the required intensity of service to ensure the patient's safety. The patient's presenting symptoms, physical exam findings, and initial radiographic and laboratory data in the context of their chronic comorbidities is felt to place them at high risk for further clinical deterioration. Furthermore, it is not anticipated that the patient will be medically stable for discharge from the hospital within 2 midnights of admission.   * I certify that at the point of admission it is my clinical judgment that  the patient will require inpatient hospital care spanning beyond 2 midnights from the point of admission due to high intensity of service, high risk for further deterioration and high frequency of surveillance required.*  Author: Para Skeans, MD 12/31/2021 8:04 PM  For on call review [email or web address].

## 2021-12-31 NOTE — ED Notes (Signed)
RT, MD called to bedside due to oxygen

## 2021-12-31 NOTE — ED Triage Notes (Signed)
Pt via POV from home. Pt c/o SOB for the past hour. Pt has a hx of COPD states that his O2 was 82% on the baseline 2L. Family increased to 4L. On arrival, pt 84% on 4L [place]. Denies pain. Pt is A&OX4, pt roomed to room 5 at this time.

## 2022-01-01 DIAGNOSIS — I2699 Other pulmonary embolism without acute cor pulmonale: Secondary | ICD-10-CM

## 2022-01-01 DIAGNOSIS — K219 Gastro-esophageal reflux disease without esophagitis: Secondary | ICD-10-CM

## 2022-01-01 DIAGNOSIS — J441 Chronic obstructive pulmonary disease with (acute) exacerbation: Principal | ICD-10-CM

## 2022-01-01 LAB — RESP PANEL BY RT-PCR (FLU A&B, COVID) ARPGX2
Influenza A by PCR: NEGATIVE
Influenza B by PCR: NEGATIVE
SARS Coronavirus 2 by RT PCR: NEGATIVE

## 2022-01-01 LAB — CBC
HCT: 36.6 % — ABNORMAL LOW (ref 39.0–52.0)
Hemoglobin: 11.9 g/dL — ABNORMAL LOW (ref 13.0–17.0)
MCH: 30.9 pg (ref 26.0–34.0)
MCHC: 32.5 g/dL (ref 30.0–36.0)
MCV: 95.1 fL (ref 80.0–100.0)
Platelets: 192 10*3/uL (ref 150–400)
RBC: 3.85 MIL/uL — ABNORMAL LOW (ref 4.22–5.81)
RDW: 15.9 % — ABNORMAL HIGH (ref 11.5–15.5)
WBC: 3.9 10*3/uL — ABNORMAL LOW (ref 4.0–10.5)
nRBC: 0 % (ref 0.0–0.2)

## 2022-01-01 LAB — BASIC METABOLIC PANEL
Anion gap: 9 (ref 5–15)
BUN: 13 mg/dL (ref 8–23)
CO2: 26 mmol/L (ref 22–32)
Calcium: 8.9 mg/dL (ref 8.9–10.3)
Chloride: 103 mmol/L (ref 98–111)
Creatinine, Ser: 0.84 mg/dL (ref 0.61–1.24)
GFR, Estimated: >60 mL/min (ref >60)
Glucose, Bld: 169 mg/dL — ABNORMAL HIGH (ref 70–99)
Potassium: 3.3 mmol/L — ABNORMAL LOW (ref 3.5–5.1)
Sodium: 138 mmol/L (ref 135–145)

## 2022-01-01 MED ORDER — ENSURE ENLIVE PO LIQD: 237.0000 mL | Freq: Two times a day (BID) | ORAL | Status: DC

## 2022-01-01 MED ORDER — THIAMINE HCL 100 MG PO TABS
100.0000 mg | ORAL_TABLET | Freq: Every day | ORAL | Status: DC
  Administered 2022-01-01 – 2022-01-02 (×2): 100 mg via ORAL
  Filled 2022-01-01 (×2): qty 1

## 2022-01-01 MED ORDER — SALINE SPRAY 0.65 % NA SOLN
1.0000 | NASAL | Status: DC | PRN
  Administered 2022-01-01: 1 via NASAL
  Filled 2022-01-01: qty 44

## 2022-01-01 MED ORDER — LORAZEPAM 1 MG PO TABS
1.0000 mg | ORAL_TABLET | ORAL | Status: DC | PRN
  Administered 2022-01-01: 2 mg via ORAL
  Filled 2022-01-01: qty 2

## 2022-01-01 MED ORDER — FOLIC ACID 1 MG PO TABS
1.0000 mg | ORAL_TABLET | Freq: Every day | ORAL | Status: DC
  Administered 2022-01-01 – 2022-01-02 (×2): 1 mg via ORAL
  Filled 2022-01-01 (×2): qty 1

## 2022-01-01 MED ORDER — THIAMINE HCL 100 MG/ML IJ SOLN: 100.0000 mg | Freq: Every day | INTRAMUSCULAR | Status: DC

## 2022-01-01 MED ORDER — LORAZEPAM 2 MG/ML IJ SOLN: 1.0000 mg | INTRAMUSCULAR | Status: DC | PRN

## 2022-01-01 MED ORDER — ADULT MULTIVITAMIN W/MINERALS CH
1.0000 | ORAL_TABLET | Freq: Every day | ORAL | Status: DC
  Administered 2022-01-01 – 2022-01-02 (×2): 1 via ORAL
  Filled 2022-01-01 (×2): qty 1

## 2022-01-01 NOTE — Progress Notes (Signed)
° °      CROSS COVER NOTE  NAME: Dylan Weber MRN: [id number] DOB : [birth year]   Secure chat received from RN reporting patient drinks a double vodka daily and last drink was 3 days ago. RN reports restlessness, anxiety, and tremors. Patient started on CIWA protocol.  Dylan Weber MHA, MSN, FNP-BC Nurse Practitioner [facility] Pager [phone]

## 2022-01-01 NOTE — Hospital Course (Addendum)
88 y.o. male with medical history significant of COPD/ Cad/ PE coming with SOB since past few days and getting worse. He is on  4L at baseline admitted for acute on chronic hypoxic respiratory failure  2/20: Pulmonary consult, on 15 L HFNC, palliative care consult

## 2022-01-01 NOTE — Progress Notes (Signed)
OT Cancellation Note  Patient Details Name: Dylan Weber MRN: [id number] DOB: Nov 13, 1932   Cancelled Treatment:    Reason Eval/Treat Not Completed: OT screened, no needs identified, will sign off. Order received, chart reviewed. Per conversation with pt, family and PT - pt near baseline functional independence for ADLs. No skilled OT needs identified. Will sign off. Please re-consult if additional needs arise.   Dessie Coma, M.S. OTR/L  01/01/22, 10:35 AM  ascom [phone]

## 2022-01-01 NOTE — Progress Notes (Signed)
Progress Note   Patient: Dylan Weber PYK:[id number] DOB: [birth year] DOA: 12/31/2021     1 DOS: the patient was seen and examined on 01/01/2022   Brief hospital course: 88 y.o. male with medical history significant of COPD/ Cad/ PE coming with SOB since past few days and getting worse. He is on  4L at baseline admitted for acute on chronic hypoxic respiratory failure  2/20: Pulmonary consult, on 15 L HFNC, palliative care consult   Assessment and Plan: * Acute on chronic respiratory failure with hypoxia ([place])- (present on admission) On 15 L HFNC.  Wean as able.  Pulmonary consult CTA negative for any PE or infiltrate.  It did show severe emphysema    CAD (coronary artery disease)- (present on admission) Stable no chest pain. Troponin negative.    Pulmonary embolism ([place])- (present on admission) Continue eliquis.  This was seen on CT scan in January 2023  Gastroesophageal reflux disease without esophagitis- (present on admission) IV protonix.    COPD with acute exacerbation ([place])- (present on admission) Continue solumedrol/ prednisone.  duoneb and PRN mucinex. Continue venotlin and breo ellipta. Empiric Zithromax         Subjective: Having shortness of breath although feeling better than on admission.  Currently on 15 L HFNC wife at bedside.  Physical Exam: Vitals:   01/01/22 0211 01/01/22 0406 01/01/22 0750 01/01/22 1134  BP:  104/77 107/78 112/68  Pulse:  95 95 100  Resp:  19 20 20   Temp:  98 F (36.7 C) 98.1 F (36.7 C) 98.2 F (36.8 C)  TempSrc:   Oral   SpO2: 94% 99% 100% 100%  Weight:      Height:       Constitutional:      General: He is in acute distress.     Appearance: He is ill-appearing.  HENT:     Head: Normocephalic and atraumatic.     Right Ear: External ear normal.     Left Ear: External ear normal.     Nose: Nose normal.     Mouth/Throat:     Mouth: Mucous membranes are moist.  Eyes:     Extraocular Movements: Extraocular  movements intact.     Pupils: Pupils are equal, round, and reactive to light.  Cardiovascular:     Rate and Rhythm: Normal rate and regular rhythm.     Pulses: Normal pulses.     Heart sounds: Normal heart sounds.  Pulmonary:     Effort: normal, no rales    Breath sounds: Expiratory Wheezing present.  Abdominal:     General: There is no distension.     Palpations: There is no mass.     Tenderness: There is no abdominal tenderness. There is no guarding.     Hernia: No hernia is present.  Musculoskeletal:     Right lower leg: No edema.     Left lower leg: No edema.  Skin:    General: Skin is warm.  Neurological:     General: No focal deficit present.     Mental Status: He is alert.     Cranial Nerves: Cranial nerves 2-12 are intact.  Psychiatric:        Mood and Affect: Mood normal.   Data Reviewed:  K 3.3  Family Communication: wife at bedside  Disposition: Status is: Inpatient Remains inpatient appropriate because: resp. failure      DVT prophylaxis-Eliquis     Planned Discharge Destination: Home     Time spent: 35 minutes  Author: Max Sane, MD 01/01/2022 2:24 PM  For on call review [email or web address].

## 2022-01-01 NOTE — Consult Note (Signed)
PULMONOLOGY         Date: 01/01/2022,   MRN# [id number] Dylan Weber November 11, 1933     AdmissionWeight: 77.1 kg                 CurrentWeight: 77.1 kg   Referring physician: Dr Manuella Ghazi   CHIEF COMPLAINT:   Acute on chronic hypoxemic respiratory failure   HISTORY OF PRESENT ILLNESS   This is a pleasant patient who is well known to me with remote PE, Advanced COPD and chronic hypoxemia, who came in due to acute on chronic respiratory failure. He shares he had cold chills and felt unwell.  He states "I could not get warmer even with warm robe and fireplace".  He is tachyarrythmic and had CTPE done which I reviewed independently and its negative for PE but does show severe emphysema and bronchitis. Saturday night he had night sweats and then woke up feeling fine but then got dyspneic and hypoxemic.    PAST MEDICAL HISTORY   Past Medical History:  Diagnosis Date   COPD (chronic obstructive pulmonary disease) ([place])    Coronary artery calcification seen on CT scan 11/17/2021   COVID-19 20/9470   Diastolic dysfunction    a. 11/2021 Echo: EF 55-60%, no rwma, GrI DD, nl RV fxn. Mild AI.   GERD (gastroesophageal reflux disease)    Pulmonary embolism ([place])    a. 11/2021 CTA Chest: small peripheral PE in RLL branch of PA-->Eliquis.   Right leg DVT ([place])    a. 11/2021 LE U/S: occlusive R calf vein DVT in R peroneal vein-->eliquis.   SVT (supraventricular tachycardia) ([place])    a. Dx 10/2021 in setting of COVID infxn.     SURGICAL HISTORY   Past Surgical History:  Procedure Laterality Date   APPENDECTOMY     CHOLECYSTECTOMY       FAMILY HISTORY   Family History  Problem Relation Age of Onset   Uterine cancer Mother    Brain cancer Sister      SOCIAL HISTORY   Social History   Tobacco Use   Smoking status: Former    Types: Cigarettes   Smokeless tobacco: Former  Scientific laboratory technician Use: Never used  Substance Use Topics   Alcohol use: Yes    Alcohol/week:  7.0 standard drinks    Types: 7 Shots of liquor per week   Drug use: Never     MEDICATIONS    Home Medication:    Current Medication:  Current Facility-Administered Medications:    acetaminophen (TYLENOL) tablet 500 mg, 500 mg, Oral, Q6H PRN, Posey Pronto, Ekta V, MD   albuterol (PROVENTIL) (2.5 MG/3ML) 0.083% nebulizer solution 3 mL, 3 mL, Inhalation, Q6H PRN, Para Skeans, MD   apixaban (ELIQUIS) tablet 5 mg, 5 mg, Oral, BID, Florina Ou V, MD, 5 mg at 01/01/22 9628   ascorbic acid (VITAMIN C) tablet 500 mg, 500 mg, Oral, Daily, Florina Ou V, MD, 500 mg at 01/01/22 0956   azithromycin (ZITHROMAX) 500 mg in sodium chloride 0.9 % 250 mL IVPB, 500 mg, Intravenous, Q24H, Para Skeans, MD, Stopped at 12/31/21 2358   bisacodyl (DULCOLAX) EC tablet 5 mg, 5 mg, Oral, Daily PRN, Para Skeans, MD   diltiazem (CARDIZEM CD) 24 hr capsule 180 mg, 180 mg, Oral, Daily, Florina Ou V, MD, 180 mg at 01/01/22 0956   docusate sodium (COLACE) capsule 100 mg, 100 mg, Oral, BID, Florina Ou V, MD, 100 mg at 01/01/22 [phone]  fluticasone furoate-vilanterol (BREO ELLIPTA) 200-25 MCG/ACT 1 puff, 1 puff, Inhalation, Daily, Para Skeans, MD, 1 puff at 69/48/54 6270   folic acid (FOLVITE) tablet 1 mg, 1 mg, Oral, Daily, Foust, Katy L, NP, 1 mg at 01/01/22 0956   guaiFENesin (MUCINEX) 12 hr tablet 600 mg, 600 mg, Oral, Daily, Florina Ou V, MD, 600 mg at 01/01/22 3500   hydrALAZINE (APRESOLINE) injection 5 mg, 5 mg, Intravenous, Q4H PRN, Para Skeans, MD   ipratropium-albuterol (DUONEB) 0.5-2.5 (3) MG/3ML nebulizer solution 3 mL, 3 mL, Nebulization, Q6H, Florina Ou V, MD, 3 mL at 01/01/22 0754   lactated ringers infusion, , Intravenous, Continuous, Para Skeans, MD, Last Rate: 20 mL/hr at 01/01/22 0809, Infusion Verify at 01/01/22 0809   LORazepam (ATIVAN) tablet 1-4 mg, 1-4 mg, Oral, Q1H PRN, 2 mg at 01/01/22 0059 **OR** LORazepam (ATIVAN) injection 1-4 mg, 1-4 mg, Intravenous, Q1H PRN, Foust, Katy L, NP    melatonin tablet 5 mg, 5 mg, Oral, QHS PRN, Foust, Katy L, NP, 5 mg at 01/01/22 0016   methylPREDNISolone sodium succinate (SOLU-MEDROL) 125 mg/2 mL injection 60 mg, 60 mg, Intravenous, Q12H **FOLLOWED BY** [START ON 01/02/2022] predniSONE (DELTASONE) tablet 40 mg, 40 mg, Oral, Q breakfast, Patel, Ekta V, MD   morphine (PF) 2 MG/ML injection 2 mg, 2 mg, Intravenous, Q2H PRN, Para Skeans, MD   multivitamin with minerals tablet 1 tablet, 1 tablet, Oral, Daily, Foust, Katy L, NP, 1 tablet at 01/01/22 0956   ondansetron (ZOFRAN) tablet 4 mg, 4 mg, Oral, Q6H PRN **OR** ondansetron (ZOFRAN) injection 4 mg, 4 mg, Intravenous, Q6H PRN, Posey Pronto, Gretta Cool, MD   polyethylene glycol (MIRALAX / GLYCOLAX) packet 17 g, 17 g, Oral, Daily PRN, Florina Ou V, MD   sodium chloride flush (NS) 0.9 % injection 3 mL, 3 mL, Intravenous, Q12H, Florina Ou V, MD, 3 mL at 01/01/22 1006   thiamine tablet 100 mg, 100 mg, Oral, Daily, 100 mg at 01/01/22 0957 **OR** thiamine (B-1) injection 100 mg, 100 mg, Intravenous, Daily, Foust, Katy L, NP   zinc sulfate capsule 220 mg, 220 mg, Oral, Daily, Florina Ou V, MD, 220 mg at 01/01/22 9381    ALLERGIES   Prednisone, Amoxicillin, and Sulfamethoxazole-trimethoprim     REVIEW OF SYSTEMS    Review of Systems:  Gen:  Denies  fever, sweats, chills weigh loss  HEENT: Denies blurred vision, double vision, ear pain, eye pain, hearing loss, nose bleeds, sore throat Cardiac:  No dizziness, chest pain or heaviness, chest tightness,edema Resp:   Denies cough or sputum porduction, shortness of breath,wheezing, hemoptysis,  Gi: Denies swallowing difficulty, stomach pain, nausea or vomiting, diarrhea, constipation, bowel incontinence Gu:  Denies bladder incontinence, burning urine Ext:   Denies Joint pain, stiffness or swelling Skin: Denies  skin rash, easy bruising or bleeding or hives Endoc:  Denies polyuria, polydipsia , polyphagia or weight change Psych:   Denies depression,  insomnia or hallucinations   Other:  All other systems negative   VS: BP 107/78 (BP Location: Right Arm)    Pulse 95    Temp 98.1 F (36.7 C) (Oral)    Resp 20    Ht 5\' 9"  (1.753 m)    Wt 77.1 kg    SpO2 100%    BMI 25.10 kg/m      PHYSICAL EXAM    GENERAL:NAD, no fevers, chills, no weakness no fatigue HEAD: Normocephalic, atraumatic.  EYES: Pupils equal, round, reactive to light. Extraocular muscles intact. No  scleral icterus.  MOUTH: Moist mucosal membrane. Dentition intact. No abscess noted.  EAR, NOSE, THROAT: Clear without exudates. No external lesions.  NECK: Supple. No thyromegaly. No nodules. No JVD.  PULMONARY: mild rhonchi bilaterallyl CARDIOVASCULAR: S1 and S2. Regular rate and rhythm. No murmurs, rubs, or gallops. No edema. Pedal pulses 2+ bilaterally.  GASTROINTESTINAL: Soft, nontender, nondistended. No masses. Positive bowel sounds. No hepatosplenomegaly.  MUSCULOSKELETAL: No swelling, clubbing, or edema. Range of motion full in all extremities.  NEUROLOGIC: Cranial nerves II through XII are intact. No gross focal neurological deficits. Sensation intact. Reflexes intact.  SKIN: No ulceration, lesions, rashes, or cyanosis. Skin warm and dry. Turgor intact.  PSYCHIATRIC: Mood, affect within normal limits. The patient is awake, alert and oriented x 3. Insight, judgment intact.       IMAGING       DG Chest 2 View  Result Date: 12/16/2021 CLINICAL DATA:  Chest pain EXAM: CHEST - 2 VIEW COMPARISON:  12/06/2021 FINDINGS: Stable cardiomediastinal contours. Lung volumes are low. No pleural effusion or edema identified. Visualized osseous structures are unremarkable. IMPRESSION: Low lung volumes.  No acute abnormality. Electronically Signed   By: Kerby Moors M.D.   On: 12/16/2021 12:13   DG Chest 2 View  Result Date: 12/07/2021 CLINICAL DATA:  Shortness of breath and decreased breath sounds. Shortness of breath today, worse with exertion. EXAM: CHEST - 2 VIEW  COMPARISON:  Chest radiograph 10/31/2021, chest CTA 11/17/2020 FINDINGS: Stable heart size and mediastinal contours, aortic tortuosity. The lungs are hyperinflated with emphysema no acute or focal airspace disease, pleural effusion, or pneumothorax. No pulmonary edema. No acute osseous abnormalities are seen. IMPRESSION: Emphysema without superimposed acute process. Electronically Signed   By: Keith Rake M.D.   On: 12/07/2021 22:51   CT Angio Chest PE W and/or Wo Contrast  Result Date: 12/31/2021 CLINICAL DATA:  PE suspected EXAM: CT ANGIOGRAPHY CHEST WITH CONTRAST TECHNIQUE: Multidetector CT imaging of the chest was performed using the standard protocol during bolus administration of intravenous contrast. Multiplanar CT image reconstructions and MIPs were obtained to evaluate the vascular anatomy. RADIATION DOSE REDUCTION: This exam was performed according to the departmental dose-optimization program which includes automated exposure control, adjustment of the mA and/or kV according to patient size and/or use of iterative reconstruction technique. CONTRAST:  30mL OMNIPAQUE IOHEXOL 350 MG/ML SOLN COMPARISON:  12/17/2021 FINDINGS: Cardiovascular: Examination for pulmonary embolism is somewhat limited by breath motion artifact particularly in the bilateral lung bases. Within this limitation, no evidence of pulmonary embolism through the segmental pulmonary arterial level. Normal heart size. Left and right coronary artery calcifications. No pericardial effusion. Aortic atherosclerosis. Mediastinum/Nodes: No enlarged mediastinal, hilar, or axillary lymph nodes. Thyroid gland, trachea, and esophagus demonstrate no significant findings. Lungs/Pleura: Severe centrilobular emphysema. No pleural effusion or pneumothorax. Upper Abdomen: No acute abnormality. Musculoskeletal: No chest wall abnormality. No acute osseous findings. Review of the MIP images confirms the above findings. IMPRESSION: 1. Examination for  pulmonary embolism is somewhat limited by breath motion artifact particularly in the bilateral lung bases. Within this limitation, no evidence of pulmonary embolism through the segmental pulmonary arterial level. 2. Severe emphysema. 3. Coronary artery disease. Aortic Atherosclerosis (ICD10-I70.0) and Emphysema (ICD10-J43.9). Electronically Signed   By: Delanna Ahmadi M.D.   On: 12/31/2021 17:33   CT Angio Chest PE W and/or Wo Contrast  Result Date: 12/17/2021 CLINICAL DATA:  Evaluate for pulmonary embolism. Shortness of breath. EXAM: CT ANGIOGRAPHY CHEST WITH CONTRAST TECHNIQUE: Multidetector CT imaging of the chest was  performed using the standard protocol during bolus administration of intravenous contrast. Multiplanar CT image reconstructions and MIPs were obtained to evaluate the vascular anatomy. RADIATION DOSE REDUCTION: This exam was performed according to the departmental dose-optimization program which includes automated exposure control, adjustment of the mA and/or kV according to patient size and/or use of iterative reconstruction technique. CONTRAST:  27mL OMNIPAQUE IOHEXOL 350 MG/ML SOLN COMPARISON:  11/17/2021 FINDINGS: Cardiovascular: Satisfactory opacification of the pulmonary arteries to the segmental level. No evidence of acute pulmonary embolism. Partial recanalization of the previous segmental embolus to the posterior right lung base, image 250/5. Aortic atherosclerosis. Coronary artery calcification. Normal heart size. No pericardial effusion. Mediastinum/Nodes: No enlarged mediastinal, hilar, or axillary lymph nodes. Thyroid gland, trachea, and esophagus demonstrate no significant findings. Lungs/Pleura: No pleural effusion, airspace consolidation, atelectasis, or pneumothorax. Moderate centrilobular and paraseptal emphysema. Similar appearance right apical scarring. Upper Abdomen: No acute abnormality.  Previous cholecystectomy. Musculoskeletal: Degenerative disc disease noted with thoracic  spine. No acute or suspicious osseous findings. Review of the MIP images confirms the above findings. IMPRESSION: 1. No evidence for acute pulmonary embolism. 2. Coronary artery calcifications noted. 3. Aortic Atherosclerosis (ICD10-I70.0) and Emphysema (ICD10-J43.9). Electronically Signed   By: Kerby Moors M.D.   On: 12/17/2021 12:28   DG Chest Portable 1 View  Result Date: 12/31/2021 CLINICAL DATA:  Shortness of breath EXAM: PORTABLE CHEST 1 VIEW COMPARISON:  Chest x-ray 12/16/2021 FINDINGS: Cardiomediastinal silhouette is unchanged. Heart size is upper normal. Tortuous thoracic aorta with calcified plaques. Pulmonary vasculature is normal. Mild emphysematous changes in the upper lungs. No focal consolidation, pleural effusion or pneumothorax identified. IMPRESSION: Mild emphysematous changes with no acute process identified. Electronically Signed   By: Ofilia Neas M.D.   On: 12/31/2021 16:21      ASSESSMENT/PLAN   Acute COPD exacerbation    -Possible viral LRTI- will perform RVP   -patient on COPD care path and is improved   - Duoneb prednisone and zithromax   Acute on chronic hypoxemic respiratory failure     - patient has AECOPD     - continue to wean O2 to spO2 goal at 88%      Thank you for allowing me to participate in the care of this patient.   Patient/Family are satisfied with care plan and all questions have been answered.  This document was prepared using Dragon voice recognition software and may include unintentional dictation errors.     Ottie Glazier, M.D.  Division of [facility]

## 2022-01-01 NOTE — Evaluation (Addendum)
Physical Therapy Evaluation Patient Details Name: Dylan Weber MRN: [id number] DOB: [birth year] Today's Date: 01/01/2022  History of Present Illness  Pt is an 88 y.o. male who presents to the emergency department for SOB, respiratory distress/COPD exacerbation. PmHx: COPD, COVID infection 2 months ago, CHF, prior PE 1/23 now on Eliquis. Patient typically wears 2 L of oxygen at home 24/7.   Clinical Impression  Pt resting in bed upon PT entrance into room for evaluation today. Pt is A&Ox4 and denies any pain currently at rest. Prior to hospitalization Pt states he lives in a 1-story home w/ his wife and he is independent w/ all ADLs. He reports he does not use any assistive devices at home.  Pt was able to complete bed mobility w/ SUPERVISION. Once seated at EOB he was able to perform sit to stand w/ SUPERVISION. Vitals were monitored throughout due to patient being on 12L of O2 in room, compared to his reports of being on 2-4L at home. Patient was able to transfer to recliner w/ SUPERVISION using RW. Ambulation was broken up into 2 bouts; he was able to ambulate ~50ft w/ CGA and RW on 15L of O2 using oxygen taken before returning back to recliner. The 2nd bout, he was able to ambulate ~69ft w/ CGA and RW on 10L of O2. SpO2 values bounced between high 80s and mid 90s throughout session and during mobility. Pt will benefit from continued skilled PT in order to improve mobility, gait, and restore PLOF. Current discharge recommendation to Pulmonary Rehab due to recent episodes of SOB and amount of supplement oxygen needed in hospital compared to at home, history of COPD, as well as to improve overall function. If unable to obtain pulmonary rehab, would benefit from outpatient PT to maximize function and endurance.      Recommendations for follow up therapy are one component of a multi-disciplinary discharge planning process, led by the attending physician.  Recommendations may be updated based on patient  status, additional functional criteria and insurance authorization.  Follow Up Recommendations Other (comment) (Pulmonary Rehab); outpatient PT if unable to obtain pulmonary rehab    Assistance Recommended at Discharge PRN  Patient can return home with the following  A little help with walking and/or transfers;A little help with bathing/dressing/bathroom;Assistance with cooking/housework;Help with stairs or ramp for entrance;Assist for transportation    Equipment Recommendations  Rolling walker  Recommendations for Other Services       Functional Status Assessment Patient has had a recent decline in their functional status and demonstrates the ability to make significant improvements in function in a reasonable and predictable amount of time.     Precautions / Restrictions Precautions Precautions: Fall Restrictions Weight Bearing Restrictions: No      Mobility  Bed Mobility Overal bed mobility: Needs Assistance Bed Mobility: Supine to Sit     Supine to sit: Supervision          Transfers Overall transfer level: Needs assistance Equipment used: Rolling walker (2 wheels) Transfers: Sit to/from Stand, Bed to chair/wheelchair/BSC Sit to Stand: Supervision   Step pivot transfers: Supervision            Ambulation/Gait Ambulation/Gait assistance: Min guard Gait Distance (Feet): 80 Feet (47ft for 1st bout on 15L O2, 82ft for 2nd bout on 10L O2) Assistive device: Rolling walker (2 wheels) Gait Pattern/deviations: Step-through pattern, Decreased step length - right, Decreased step length - left, Decreased stride length          Stairs  Wheelchair Mobility    Modified Rankin (Stroke Patients Only)       Balance Overall balance assessment: Needs assistance Sitting-balance support: Feet supported, Bilateral upper extremity supported, Single extremity supported Sitting balance-Leahy Scale: Good     Standing balance support: During functional  activity, Bilateral upper extremity supported, Reliant on assistive device for balance Standing balance-Leahy Scale: Good                               Pertinent Vitals/Pain Pain Assessment Pain Assessment: No/denies pain    Home Living Family/patient expects to be discharged to:: Private residence Living Arrangements: Spouse/significant other Available Help at Discharge: Family Type of Home: House Home Access: Level entry       Home Layout: One level Home Equipment: None;Tub bench      Prior Function Prior Level of Function : Independent/Modified Independent                     Hand Dominance        Extremity/Trunk Assessment   Upper Extremity Assessment Upper Extremity Assessment: Overall WFL for tasks assessed    Lower Extremity Assessment Lower Extremity Assessment: Overall WFL for tasks assessed       Communication      Cognition Arousal/Alertness: Awake/alert Behavior During Therapy: WFL for tasks assessed/performed Overall Cognitive Status: Within Functional Limits for tasks assessed                                          General Comments      Exercises     Assessment/Plan    PT Assessment Patient needs continued PT services  PT Problem List Decreased strength;Cardiopulmonary status limiting activity;Decreased activity tolerance;Decreased coordination       PT Treatment Interventions DME instruction;Therapeutic activities;Gait training;Therapeutic exercise;Stair training;Functional mobility training;Neuromuscular re-education    PT Goals (Current goals can be found in the Care Plan section)  Acute Rehab PT Goals Patient Stated Goal: to improve symptoms and go home PT Goal Formulation: With patient Time For Goal Achievement: 01/15/22 Potential to Achieve Goals: Good    Frequency Min 2X/week     Co-evaluation               AM-PAC PT "6 Clicks" Mobility  Outcome Measure Help needed turning  from your back to your side while in a flat bed without using bedrails?: None Help needed moving from lying on your back to sitting on the side of a flat bed without using bedrails?: None Help needed moving to and from a bed to a chair (including a wheelchair)?: A Little Help needed standing up from a chair using your arms (e.g., wheelchair or bedside chair)?: A Little Help needed to walk in hospital room?: A Little Help needed climbing 3-5 steps with a railing? : A Little 6 Click Score: 20    End of Session Equipment Utilized During Treatment: Gait belt Activity Tolerance: Patient tolerated treatment well Patient left: in chair;with chair alarm set;with call bell/phone within reach Nurse Communication: Mobility status PT Visit Diagnosis: Muscle weakness (generalized) (M62.81);Other abnormalities of gait and mobility (R26.89);Other (comment) (SOB)    Time: 2440-1027 PT Time Calculation (min) (ACUTE ONLY): 27 min   Charges:             Jonnie Kind, SPT 01/01/2022, 12:34 PM

## 2022-01-01 NOTE — Progress Notes (Signed)
Admission to [facility].  Patient arrived from ED in stretcher on non-rebreather O2Sat 92%, accompanied by spouse. Patient a/o x4, HR 120s, BP 125/79, temp 98.8, RR 20. Patient with evident hand tremors, restless, and anxious. Sacral foam applied. Belongings at bedside: shoes, cell phone, and hearing aids. Patient oriented to room and call light. Call light left within reach and bed alarm turned on.

## 2022-01-01 NOTE — Progress Notes (Signed)
Initial Nutrition Assessment  DOCUMENTATION CODES:   Not applicable  INTERVENTION:   -Ensure Enlive po BID, each supplement provides 350 kcal and 20 grams of protein -MVI with minerals daily  NUTRITION DIAGNOSIS:   Increased nutrient needs related to chronic illness (COPD, emphysema) as evidenced by estimated needs.  GOAL:   Patient will meet greater than or equal to 90% of their needs  MONITOR:   PO intake, Supplement acceptance, Labs, Weight trends, Skin, I & O's  REASON FOR ASSESSMENT:   Consult Assessment of nutrition requirement/status  ASSESSMENT:   Dylan Weber is a 88 y.o. male with medical history significant of COPD/ Cad/ PE coming with SOB since past few days and getting worse. He is on  4L at baseline.  Pt admitted with COPD exacerbation and severe emphysema.   Reviewed I/O's: +653 ml x 24 hours   Pt unavailable at time of visit. Attempted to speak with pt via call to hospital room phone, however, unable to reach. RD unable to obtain further nutrition-related history or complete nutrition-focused physical exam at this time.    Per MD notes, pt on 15 L HFNC; palliative care consult pending.   Pt currently on a regular diet with good appetite. Noted meal completions 75-100%.   Reviewed wt hx; pt has experienced a 1.8% wt loss over the past 2 months, which is not significant for time frame.   Medications reviewed and include vitamin C, colace, folic acid, solu-medrol, thiamine, zinc sulfate, and lactated ringers infusion @ 20 ml/hr.   Labs reviewed.   Diet Order:   Diet Order             Diet regular Room service appropriate? Yes; Fluid consistency: Thin  Diet effective now                   EDUCATION NEEDS:   No education needs have been identified at this time  Skin:  Skin Assessment: Reviewed RN Assessment  Last BM:  12/31/21  Height:   Ht Readings from Last 1 Encounters:  12/31/21 5\' 9"  (1.753 m)    Weight:   Wt Readings from  Last 1 Encounters:  12/31/21 77.1 kg    Ideal Body Weight:  72.7 kg  BMI:  Body mass index is 25.1 kg/m.  Estimated Nutritional Needs:   Kcal:  1900-2100  Protein:  100-115 grams  Fluid:  > 1.9 L    Loistine Chance, RD, LDN, [place] Registered Dietitian II Certified Diabetes Care and Education Specialist Please refer to [facility] for RD and/or RD on-call/weekend/after hours pager

## 2022-01-02 LAB — RESPIRATORY PANEL BY PCR

## 2022-01-02 LAB — CBC
HCT: 35.5 % — ABNORMAL LOW (ref 39.0–52.0)
Hemoglobin: 11.9 g/dL — ABNORMAL LOW (ref 13.0–17.0)
MCH: 31.6 pg (ref 26.0–34.0)
MCHC: 33.5 g/dL (ref 30.0–36.0)
MCV: 94.4 fL (ref 80.0–100.0)
Platelets: 196 10*3/uL (ref 150–400)
RBC: 3.76 MIL/uL — ABNORMAL LOW (ref 4.22–5.81)
RDW: 16.4 % — ABNORMAL HIGH (ref 11.5–15.5)
WBC: 10.9 10*3/uL — ABNORMAL HIGH (ref 4.0–10.5)
nRBC: 0 % (ref 0.0–0.2)

## 2022-01-02 LAB — BASIC METABOLIC PANEL
Anion gap: 9 (ref 5–15)
BUN: 15 mg/dL (ref 8–23)
CO2: 27 mmol/L (ref 22–32)
Calcium: 9.1 mg/dL (ref 8.9–10.3)
Chloride: 104 mmol/L (ref 98–111)
Creatinine, Ser: 0.79 mg/dL (ref 0.61–1.24)
GFR, Estimated: >60 mL/min (ref >60)
Glucose, Bld: 118 mg/dL — ABNORMAL HIGH (ref 70–99)
Potassium: 3.5 mmol/L (ref 3.5–5.1)
Sodium: 140 mmol/L (ref 135–145)

## 2022-01-02 MED ORDER — PREDNISONE 10 MG (21) PO TBPK: ORAL_TABLET | ORAL | 0 refills | Status: DC

## 2022-01-02 MED ORDER — IPRATROPIUM-ALBUTEROL 0.5-2.5 (3) MG/3ML IN SOLN: 3.0000 mL | Freq: Three times a day (TID) | RESPIRATORY_TRACT | Status: DC

## 2022-01-02 NOTE — Discharge Summary (Signed)
Physician Discharge Summary   Patient: Dylan Weber MRN: [id number] DOB: May 09, 1933  Admit date:     12/31/2021  Discharge date: 01/02/22  Discharge Physician: Max Sane   PCP: Peggye Form, NP   Recommendations at discharge:   Follow-up with outpatient providers as requested  Discharge Diagnoses: Principal Problem:   Acute on chronic respiratory failure with hypoxia ([place]) Active Problems:   COPD exacerbation (HCC)   Gastroesophageal reflux disease without esophagitis   Pulmonary embolism (HCC)   CAD (coronary artery disease)  Resolved Problems:   Thrombocytopenia (HCC)   Diastolic dysfunction   Acute and chronic respiratory failure (acute-on-chronic) [facility])   Hospital Course: 88 y.o. male with medical history significant of COPD/ Cad/ PE coming with SOB since past few days and getting worse. He is on  4L at baseline admitted for acute on chronic hypoxic respiratory failure  2/20: Pulmonary consult, on 15 L HFNC, palliative care consult  Assessment and Plan: * Acute on chronic respiratory failure with hypoxia ([place])- (present on admission) On 15 L HFNC and weaned off to 2 L oxygen by nasal cannula on the day of discharge CTA negative for any PE or infiltrate.  It did show severe emphysema which is known and chronic  CAD (coronary artery disease)- (present on admission) Stable no chest pain. Troponin negative.   History of pulmonary embolism ([place])- (present on admission) Continue eliquis.  This was seen on CT scan in January 2023  Gastroesophageal reflux disease without esophagitis- (present on admission) protonix.   COPD exacerbation ([place])  improved with steroids, nebs, inhalers and empiric antibiotics.  Being discharged on prednisone taper   History of alcohol abuse with mild alcohol withdrawal He did not require much treatment of this at all      Consultants: Pulmonary Disposition: Home Diet recommendation:  Discharge Diet Orders (From admission,  onward)     Start     Ordered   01/02/22 0000  Diet - low sodium heart healthy        01/02/22 0848           Carb modified diet  DISCHARGE MEDICATION: Allergies as of 01/02/2022       Reactions   Prednisone Other (See Comments)   jittery   Amoxicillin Rash   Sulfamethoxazole-trimethoprim Rash        Medication List     STOP taking these medications    amoxicillin 250 MG capsule Commonly known as: AMOXIL   hydrOXYzine 10 MG tablet Commonly known as: ATARAX   hydrOXYzine 25 MG tablet Commonly known as: ATARAX   sulfamethoxazole-trimethoprim 400-80 MG tablet Commonly known as: BACTRIM   traZODone 100 MG tablet Commonly known as: DESYREL       TAKE these medications    acetaminophen 650 MG CR tablet Commonly known as: TYLENOL Take 650 mg by mouth as needed for pain.   albuterol 108 (90 Base) MCG/ACT inhaler Commonly known as: VENTOLIN HFA Inhale 2 puffs into the lungs every 6 (six) hours as needed for wheezing or shortness of breath.   ALPRAZolam 0.25 MG tablet Commonly known as: Xanax Take 1 tablet (0.25 mg total) by mouth 3 (three) times daily as needed for anxiety.   apixaban 5 MG Tabs tablet Commonly known as: ELIQUIS Take 2 tablets (10 mg total) by mouth 2 (two) times daily for 6 days, THEN 1 tablet (5 mg total) 2 (two) times daily. Start taking on: November 27, 2021   ascorbic acid 500 MG tablet Commonly known as: VITAMIN  C Take 1 tablet (500 mg total) by mouth daily.   bacitracin ointment Apply topically 2 (two) times daily.   Breo Ellipta 200-25 MCG/ACT Aepb Generic drug: fluticasone furoate-vilanterol Inhale 1 puff into the lungs daily.   diltiazem 180 MG 24 hr capsule Commonly known as: CARDIZEM CD Take 1 capsule (180 mg total) by mouth daily.   doxepin 10 MG capsule Commonly known as: SINEQUAN Take 10 mg by mouth at bedtime.   guaiFENesin 600 MG 12 hr tablet Commonly known as: MUCINEX Take 600 mg by mouth daily.    Ipratropium-Albuterol 20-100 MCG/ACT Aers respimat Commonly known as: COMBIVENT Inhale 2 puffs into the lungs 2 (two) times daily.   multivitamin capsule Take 1 capsule by mouth daily.   nystatin 100000 UNIT/ML suspension Commonly known as: MYCOSTATIN Take 5 mLs by mouth 4 (four) times daily. Swish and swallow   pantoprazole 40 MG tablet Commonly known as: PROTONIX Take 40 mg by mouth daily.   predniSONE 10 MG (21) Tbpk tablet Commonly known as: STERAPRED UNI-PAK 21 TAB Start 60 mg po daily, taper 10 mg daily until finish   pseudoephedrine-acetaminophen 30-500 MG Tabs tablet Commonly known as: TYLENOL SINUS Take 1 tablet by mouth as needed.   tamsulosin 0.4 MG Caps capsule Commonly known as: FLOMAX Take 0.4 mg by mouth daily. Take 30 minutes after the same meal everyday   torsemide 10 MG tablet Commonly known as: DEMADEX Take 10 mg by mouth daily.   triamcinolone 55 MCG/ACT Aero nasal inhaler Commonly known as: NASACORT Place 2 sprays into the nose daily.   zinc sulfate 220 (50 Zn) MG capsule Take 1 capsule (220 mg total) by mouth daily.        Follow-up Information     Fields, Colan Neptune, NP. Schedule an appointment as soon as possible for a visit on 01/05/2022.   Specialty: Family Medicine Why: [facility] Discharge F/UP @ 2pm Contact information: [place] [address] [phone]         Minna Merritts, MD. Schedule an appointment as soon as possible for a visit on 02/06/2022.   Specialty: Cardiology Why: [facility] Discharge F/UP @2 :20pm Contact information: [place] [address] [phone]         Ottie Glazier, MD. Schedule an appointment as soon as possible for a visit on 01/08/2022.   Specialty: Pulmonary Disease Why: [facility] Discharge F/UP @ 1:15pm Contact information: [place] [address] [phone]                 Discharge  Exam: Danley Danker Weights   12/31/21 1542  Weight: 61.5 kg   88 year old male lying in the bed comfortably without any acute distress Eyes pupil equal round reactive to light accommodation Lungs clear to auscultation bilaterally no wheezing rales rhonchi or crepitation Cardiovascular S1-S2 normal, no murmurs or gallop Abdomen soft, benign Neuro alert and oriented  Condition at discharge: good  The results of significant diagnostics from this hospitalization (including imaging, microbiology, ancillary and laboratory) are listed below for reference.   Imaging Studies: DG Chest 2 View  Result Date: 12/16/2021 CLINICAL DATA:  Chest pain EXAM: CHEST - 2 VIEW COMPARISON:  12/06/2021 FINDINGS: Stable cardiomediastinal contours. Lung volumes are low. No pleural effusion or edema identified. Visualized osseous structures are unremarkable. IMPRESSION: Low lung volumes.  No acute abnormality. Electronically Signed   By: Kerby Moors M.D.   On: 12/16/2021 12:13   DG Chest  2 View  Result Date: 12/07/2021 CLINICAL DATA:  Shortness of breath and decreased breath sounds. Shortness of breath today, worse with exertion. EXAM: CHEST - 2 VIEW COMPARISON:  Chest radiograph 10/31/2021, chest CTA 11/17/2020 FINDINGS: Stable heart size and mediastinal contours, aortic tortuosity. The lungs are hyperinflated with emphysema no acute or focal airspace disease, pleural effusion, or pneumothorax. No pulmonary edema. No acute osseous abnormalities are seen. IMPRESSION: Emphysema without superimposed acute process. Electronically Signed   By: Keith Rake M.D.   On: 12/07/2021 22:51   CT Angio Chest PE W and/or Wo Contrast  Result Date: 12/31/2021 CLINICAL DATA:  PE suspected EXAM: CT ANGIOGRAPHY CHEST WITH CONTRAST TECHNIQUE: Multidetector CT imaging of the chest was performed using the standard protocol during bolus administration of intravenous contrast. Multiplanar CT image reconstructions and MIPs were obtained to  evaluate the vascular anatomy. RADIATION DOSE REDUCTION: This exam was performed according to the departmental dose-optimization program which includes automated exposure control, adjustment of the mA and/or kV according to patient size and/or use of iterative reconstruction technique. CONTRAST:  40mL OMNIPAQUE IOHEXOL 350 MG/ML SOLN COMPARISON:  12/17/2021 FINDINGS: Cardiovascular: Examination for pulmonary embolism is somewhat limited by breath motion artifact particularly in the bilateral lung bases. Within this limitation, no evidence of pulmonary embolism through the segmental pulmonary arterial level. Normal heart size. Left and right coronary artery calcifications. No pericardial effusion. Aortic atherosclerosis. Mediastinum/Nodes: No enlarged mediastinal, hilar, or axillary lymph nodes. Thyroid gland, trachea, and esophagus demonstrate no significant findings. Lungs/Pleura: Severe centrilobular emphysema. No pleural effusion or pneumothorax. Upper Abdomen: No acute abnormality. Musculoskeletal: No chest wall abnormality. No acute osseous findings. Review of the MIP images confirms the above findings. IMPRESSION: 1. Examination for pulmonary embolism is somewhat limited by breath motion artifact particularly in the bilateral lung bases. Within this limitation, no evidence of pulmonary embolism through the segmental pulmonary arterial level. 2. Severe emphysema. 3. Coronary artery disease. Aortic Atherosclerosis (ICD10-I70.0) and Emphysema (ICD10-J43.9). Electronically Signed   By: Delanna Ahmadi M.D.   On: 12/31/2021 17:33   CT Angio Chest PE W and/or Wo Contrast  Result Date: 12/17/2021 CLINICAL DATA:  Evaluate for pulmonary embolism. Shortness of breath. EXAM: CT ANGIOGRAPHY CHEST WITH CONTRAST TECHNIQUE: Multidetector CT imaging of the chest was performed using the standard protocol during bolus administration of intravenous contrast. Multiplanar CT image reconstructions and MIPs were obtained to  evaluate the vascular anatomy. RADIATION DOSE REDUCTION: This exam was performed according to the departmental dose-optimization program which includes automated exposure control, adjustment of the mA and/or kV according to patient size and/or use of iterative reconstruction technique. CONTRAST:  42mL OMNIPAQUE IOHEXOL 350 MG/ML SOLN COMPARISON:  11/17/2021 FINDINGS: Cardiovascular: Satisfactory opacification of the pulmonary arteries to the segmental level. No evidence of acute pulmonary embolism. Partial recanalization of the previous segmental embolus to the posterior right lung base, image 250/5. Aortic atherosclerosis. Coronary artery calcification. Normal heart size. No pericardial effusion. Mediastinum/Nodes: No enlarged mediastinal, hilar, or axillary lymph nodes. Thyroid gland, trachea, and esophagus demonstrate no significant findings. Lungs/Pleura: No pleural effusion, airspace consolidation, atelectasis, or pneumothorax. Moderate centrilobular and paraseptal emphysema. Similar appearance right apical scarring. Upper Abdomen: No acute abnormality.  Previous cholecystectomy. Musculoskeletal: Degenerative disc disease noted with thoracic spine. No acute or suspicious osseous findings. Review of the MIP images confirms the above findings. IMPRESSION: 1. No evidence for acute pulmonary embolism. 2. Coronary artery calcifications noted. 3. Aortic Atherosclerosis (ICD10-I70.0) and Emphysema (ICD10-J43.9). Electronically Signed   By: Queen Slough.D.  On: 12/17/2021 12:28   DG Chest Portable 1 View  Result Date: 12/31/2021 CLINICAL DATA:  Shortness of breath EXAM: PORTABLE CHEST 1 VIEW COMPARISON:  Chest x-ray 12/16/2021 FINDINGS: Cardiomediastinal silhouette is unchanged. Heart size is upper normal. Tortuous thoracic aorta with calcified plaques. Pulmonary vasculature is normal. Mild emphysematous changes in the upper lungs. No focal consolidation, pleural effusion or pneumothorax identified.  IMPRESSION: Mild emphysematous changes with no acute process identified. Electronically Signed   By: Ofilia Neas M.D.   On: 12/31/2021 16:21    Microbiology: Results for orders placed or performed during the hospital encounter of 12/31/21  Resp Panel by RT-PCR (Flu A&B, Covid) Nasopharyngeal Swab     Status: None   Collection Time: 12/31/21  4:06 PM   Specimen: Nasopharyngeal Swab; Nasopharyngeal(NP) swabs in vial transport medium  Result Value Ref Range Status   SARS Coronavirus 2 by RT PCR NEGATIVE NEGATIVE Final    Comment: (NOTE) SARS-CoV-2 target nucleic acids are NOT DETECTED.  The SARS-CoV-2 RNA is generally detectable in upper respiratory specimens during the acute phase of infection. The lowest concentration of SARS-CoV-2 viral copies this assay can detect is 138 copies/mL. A negative result does not preclude SARS-Cov-2 infection and should not be used as the sole basis for treatment or other patient management decisions. A negative result may occur with  improper specimen collection/handling, submission of specimen other than nasopharyngeal swab, presence of viral mutation(s) within the areas targeted by this assay, and inadequate number of viral copies(<138 copies/mL). A negative result must be combined with clinical observations, patient history, and epidemiological information. The expected result is Negative.  Fact Sheet for Patients:  EntrepreneurPulse.com.au  Fact Sheet for Healthcare Providers:  IncredibleEmployment.be  This test is no t yet approved or cleared by the [place] FDA and  has been authorized for detection and/or diagnosis of SARS-CoV-2 by FDA under an Emergency Use Authorization (EUA). This EUA will remain  in effect (meaning this test can be used) for the duration of the COVID-19 declaration under Section 564(b)(1) of the Act, 21 U.S.C.section 360bbb-3(b)(1), unless the authorization is terminated  or  revoked sooner.       Influenza A by PCR NEGATIVE NEGATIVE Final   Influenza B by PCR NEGATIVE NEGATIVE Final    Comment: (NOTE) The Xpert Xpress SARS-CoV-2/FLU/RSV plus assay is intended as an aid in the diagnosis of influenza from Nasopharyngeal swab specimens and should not be used as a sole basis for treatment. Nasal washings and aspirates are unacceptable for Xpert Xpress SARS-CoV-2/FLU/RSV testing.  Fact Sheet for Patients: EntrepreneurPulse.com.au  Fact Sheet for Healthcare Providers: IncredibleEmployment.be  This test is not yet approved or cleared by the [place] FDA and has been authorized for detection and/or diagnosis of SARS-CoV-2 by FDA under an Emergency Use Authorization (EUA). This EUA will remain in effect (meaning this test can be used) for the duration of the COVID-19 declaration under Section 564(b)(1) of the Act, 21 U.S.C. section 360bbb-3(b)(1), unless the authorization is terminated or revoked.  Performed at [facility], [place], [place] [address]   Respiratory (~20 pathogens) panel by PCR     Status: None   Collection Time: 01/01/22  4:06 PM   Specimen: Nasopharyngeal Swab; Respiratory  Result Value Ref Range Status   Adenovirus NOT DETECTED NOT DETECTED Final   Coronavirus 229E NOT DETECTED NOT DETECTED Final    Comment: (NOTE) The Coronavirus on the Respiratory Panel, DOES NOT test for the novel  Coronavirus (2019  nCoV)    Coronavirus HKU1 NOT DETECTED NOT DETECTED Final   Coronavirus NL63 NOT DETECTED NOT DETECTED Final   Coronavirus OC43 NOT DETECTED NOT DETECTED Final   Metapneumovirus NOT DETECTED NOT DETECTED Final   Rhinovirus / Enterovirus NOT DETECTED NOT DETECTED Final   Influenza A NOT DETECTED NOT DETECTED Final   Influenza B NOT DETECTED NOT DETECTED Final   Parainfluenza Virus 1 NOT DETECTED NOT DETECTED Final   Parainfluenza Virus 2 NOT DETECTED NOT DETECTED  Final   Parainfluenza Virus 3 NOT DETECTED NOT DETECTED Final   Parainfluenza Virus 4 NOT DETECTED NOT DETECTED Final   Respiratory Syncytial Virus NOT DETECTED NOT DETECTED Final   Bordetella pertussis NOT DETECTED NOT DETECTED Final   Bordetella Parapertussis NOT DETECTED NOT DETECTED Final   Chlamydophila pneumoniae NOT DETECTED NOT DETECTED Final   Mycoplasma pneumoniae NOT DETECTED NOT DETECTED Final    Comment: Performed at [facility], [place] [address] [place], [place] [address]    Labs: CBC: Recent Labs  Lab 12/31/21 1606 01/01/22 0429 01/02/22 0604  WBC 6.3 3.9* 10.9*  HGB 13.0 11.9* 11.9*  HCT 40.7 36.6* 35.5*  MCV 97.1 95.1 94.4  PLT 208 192 096   Basic Metabolic Panel: Recent Labs  Lab 12/31/21 1606 01/01/22 0429 01/02/22 0604  NA 138 138 140  K 3.4* 3.3* 3.5  CL 100 103 104  CO2 26 26 27   GLUCOSE 142* 169* 118*  BUN 14 13 15   CREATININE 1.05 0.84 0.79  CALCIUM 9.2 8.9 9.1   Liver Function Tests: Recent Labs  Lab 12/31/21 1606  AST 24  ALT 21  ALKPHOS 64  BILITOT 1.0  PROT 7.8  ALBUMIN 4.1   CBG: No results for input(s): GLUCAP in the last 168 hours.  Discharge time spent: greater than 30 minutes.  Signed: Max Sane, MD [facility] 01/02/2022

## 2022-01-02 NOTE — TOC Transition Note (Addendum)
Transition of Care [facility]) - CM/SW Discharge Note   Patient Details  Name: Ernan Runkles MRN: [id number] Date of Birth: [birth year]  [facility] of [facility] CM/SW Contact:  Candie Chroman, LCSW Phone Number: 01/02/2022, 9:54 AM   Clinical Narrative: Patient has orders to discharge home today. Met with patient. Wife at bedside. CSW introduced role and explained that PT recommendations would be discussed. Patient and his wife are agreeable to pulmonary rehab. Left voicemail for Karie Fetch to notify. Patient declined RW. Wife will pick one up at a store if needed. They confirmed he is on oxygen at home. No further concerns. CSW signing off.    10:22 am: Received call back from [facility]. She will need pulmonary rehab referral from Dr. Lanney Gins. Sent him a secure chat to notify. No further concerns. CSW signing off.  Final next level of care: OP Rehab Barriers to Discharge: No Barriers Identified   Patient Goals and CMS Choice        Discharge Placement                Patient to be transferred to facility by: Wife will drive him home Name of family member notified: Delane Wessinger Patient and family notified of of transfer: 01/02/22  Discharge Plan and Services                                     Social Determinants of Health (SDOH) Interventions     Readmission Risk Interventions No flowsheet data found.

## 2022-01-09 ENCOUNTER — Other Ambulatory Visit: Payer: Self-pay

## 2022-01-09 ENCOUNTER — Encounter: Payer: Self-pay | Admitting: Cardiovascular Disease

## 2022-01-09 ENCOUNTER — Ambulatory Visit: Payer: Medicare Other | Admitting: Cardiovascular Disease

## 2022-01-09 VITALS — BP 106/54 | HR 87 | Ht 69.0 in | Wt 166.0 lb

## 2022-01-09 DIAGNOSIS — J9621 Acute and chronic respiratory failure with hypoxia: Secondary | ICD-10-CM

## 2022-01-09 DIAGNOSIS — I471 Supraventricular tachycardia: Secondary | ICD-10-CM | POA: Diagnosis not present

## 2022-01-09 DIAGNOSIS — I2699 Other pulmonary embolism without acute cor pulmonale: Secondary | ICD-10-CM

## 2022-01-09 DIAGNOSIS — J441 Chronic obstructive pulmonary disease with (acute) exacerbation: Secondary | ICD-10-CM

## 2022-01-09 NOTE — Patient Instructions (Signed)
Medication Instructions:  No changes  Try the diltiazem in the PM For low pressure, we may need to decrease the diltiazem er to 120 mg daily  If you need a refill on your cardiac medications before your next appointment, please call your pharmacy.   Lab work: No new labs needed  Testing/Procedures: No new testing needed  Follow-Up: At [facility], you and your health needs are our priority.  As part of our continuing mission to provide you with exceptional heart care, we have created designated Provider Care Teams.  These Care Teams include your primary Cardiologist (physician) and Advanced Practice Providers (APPs -  Physician Assistants and Nurse Practitioners) who all work together to provide you with the care you need, when you need it.  You will need a follow up appointment in 6 months  Providers on your designated Care Team:   Murray Hodgkins, NP Christell Faith, PA-C Cadence Kathlen Mody, [place]  COVID-19 Vaccine Information can be found at: ShippingScam.co.uk For questions related to vaccine distribution or appointments, please email [email or web address] or call [phone].

## 2022-01-09 NOTE — Progress Notes (Signed)
Cardiology Office Note  Date:  01/09/2022   ID:  Dylan Weber, DOB [birth year], MRN [id number]  PCP:  Peggye Form, NP   Chief Complaint  Patient presents with   1 month follow up     Patient c/o LE edema and shortness of breath. Medications reviewed by the patient verbally.     HPI:  Dylan Weber is a 88 year old male with history of paroxysmal SVT, PE, DVT COPD on 2 L home oxygen, former smoker x50+ years,  Admission to the hospital January 2023 feeling faint, presyncope/SVT CT showing PE, lower extremity ultrasound was obtained showing DVT in the right lower extremity.,  Started on Eliquis Who presents for follow-up in the office for his PE and SVT  Last seen by myself in clinic December 04, 2021  Discussed recent hospitalization, discharged last week Copd exaerbation d/c 01/02/22 Initially presented on 15 L HFNC, weaned down to 2 L steroids, nebs, inhalers and empiric antibiotics  Since he has been home, continues to be sedentary Recently [place], was dizzy, had to sit down  Recently evaluated by pulmonary ,eliquis down to 2.5 BID  Trace lower extremity edema Feels his breathing is back to baseline  EKG personally reviewed by myself on todays visit Normal sinus rhythm rate 87 bpm right bundle branch block  Other past medical history reviewed 10/17/21 reports seeing pulmonary, COVID-19 positive, COPD exacerbation Treated with PACs Flovent and prednisone  On October 31, 2021 presented to the emergency room with near syncope Saturations 84% on room air Was admitted overnight Treated for atrial tachycardia/SVT, started on Cardizem CD  November 17, 2021 CT scan chest, small PE  November 24, 2021 Admitted to the hospital with near syncope Lower extremity venous Doppler : occlusive right calf vein DVT in the right peroneal vein. No other DVT identified.  For tachycardia diltiazem extended release increased up to 180 mg daily Started on Eliquis at  discharge  Echocardiogram reviewed on today's visit November 25, 2021  1. Left ventricular ejection fraction, by estimation, is 55 to 60%. The  left ventricle has normal function. The left ventricle has no regional  wall motion abnormalities. Left ventricular diastolic parameters are  consistent with Grade I diastolic  dysfunction (impaired relaxation).   2. Right ventricular systolic function is normal. The right ventricular  size is not well visualized.   3. The mitral valve is grossly normal. No evidence of mitral valve  regurgitation.   4. The aortic valve was not well visualized. Aortic valve regurgitation  is mild.   EKG personally reviewed by myself on todays visit Normal sinus rhythm rate 91 bpm PVCs  PMH:   has a past medical history of COPD (chronic obstructive pulmonary disease) ([place]), Coronary artery calcification seen on CT scan (11/17/2021), COVID-19 (95/0932), Diastolic dysfunction, GERD (gastroesophageal reflux disease), Pulmonary embolism ([place]), Right leg DVT ([place]), and SVT (supraventricular tachycardia) ([place]).  PSH:    Past Surgical History:  Procedure Laterality Date   APPENDECTOMY     CHOLECYSTECTOMY      Current Outpatient Medications  Medication Sig Dispense Refill   acetaminophen (TYLENOL) 650 MG CR tablet Take 650 mg by mouth as needed for pain.     albuterol (VENTOLIN HFA) 108 (90 Base) MCG/ACT inhaler Inhale 2 puffs into the lungs every 6 (six) hours as needed for wheezing or shortness of breath. 8 g 0   ALPRAZolam (XANAX) 0.25 MG tablet Take 1 tablet (0.25 mg total) by mouth 3 (three) times daily as needed for anxiety.  6 tablet 0   apixaban (ELIQUIS) 5 MG TABS tablet Take 2 tablets (10 mg total) by mouth 2 (two) times daily for 6 days, THEN 1 tablet (5 mg total) 2 (two) times daily. 192 tablet 0   ascorbic acid (VITAMIN C) 500 MG tablet Take 1 tablet (500 mg total) by mouth daily. 30 tablet 0   bacitracin ointment Apply topically 2 (two) times daily.      BREO ELLIPTA 200-25 MCG/ACT AEPB Inhale 1 puff into the lungs daily.     diltiazem (CARDIZEM CD) 180 MG 24 hr capsule Take 1 capsule (180 mg total) by mouth daily. 90 capsule 3   guaiFENesin (MUCINEX) 600 MG 12 hr tablet Take 600 mg by mouth daily.     Ipratropium-Albuterol (COMBIVENT) 20-100 MCG/ACT AERS respimat Inhale 2 puffs into the lungs 2 (two) times daily.     Multiple Vitamin (MULTIVITAMIN) capsule Take 1 capsule by mouth daily.     pantoprazole (PROTONIX) 40 MG tablet Take 40 mg by mouth daily.     pseudoephedrine-acetaminophen (TYLENOL SINUS) 30-500 MG TABS tablet Take 1 tablet by mouth as needed.     spironolactone (ALDACTONE) 25 MG tablet Take 25 mg by mouth every other day.     tamsulosin (FLOMAX) 0.4 MG CAPS capsule Take 0.4 mg by mouth every other day. Take 30 minutes after the same meal everyday     torsemide (DEMADEX) 10 MG tablet Take 10 mg by mouth daily.     triamcinolone (NASACORT) 55 MCG/ACT AERO nasal inhaler Place 2 sprays into the nose daily.     zinc sulfate 220 (50 Zn) MG capsule Take 1 capsule (220 mg total) by mouth daily. 30 capsule 0   doxepin (SINEQUAN) 10 MG capsule Take 10 mg by mouth at bedtime. (Patient not taking: Reported on 01/09/2022)     predniSONE (STERAPRED UNI-PAK 21 TAB) 10 MG (21) TBPK tablet Start 60 mg po daily, taper 10 mg daily until finish (Patient not taking: Reported on 01/09/2022) 21 tablet 0   No current facility-administered medications for this visit.     Allergies:   Prednisone, Amoxicillin, and Sulfamethoxazole-trimethoprim   Social History:  The patient  reports that he has quit smoking. His smoking use included cigarettes. He has quit using smokeless tobacco. He reports current alcohol use of about 7.0 standard drinks per week. He reports that he does not use drugs.   Family History:   family history includes Brain cancer in his sister; Uterine cancer in his mother.    Review of Systems: Review of Systems  Constitutional:  Negative.   HENT: Negative.    Respiratory:  Positive for shortness of breath.   Cardiovascular: Negative.   Gastrointestinal: Negative.   Musculoskeletal: Negative.   Neurological: Negative.   Psychiatric/Behavioral: Negative.    All other systems reviewed and are negative.   PHYSICAL EXAM: VS:  BP (!) 106/54 (BP Location: Left Arm, Patient Position: Sitting, Cuff Size: Normal)    Pulse 87    Ht 5\' 9"  (1.753 m)    Wt 166 lb (75.3 kg)    BMI 24.51 kg/m  , BMI Body mass index is 24.51 kg/m. Constitutional:  oriented to person, place, and time. No distress.  HENT:  Head: Grossly normal Eyes:  no discharge. No scleral icterus.  Neck: No JVD, no carotid bruits  Cardiovascular: Regular rate and rhythm, no murmurs appreciated Pulmonary/Chest: Moderately decreased breath sounds throughout, scattered Rales Abdominal: Soft.  no distension.  no tenderness.  Musculoskeletal: Normal  range of motion Neurological:  normal muscle tone. Coordination normal. No atrophy Skin: Skin warm and dry Psychiatric: normal affect, pleasant  Recent Labs: 10/31/2021: TSH 1.028 12/31/2021: ALT 21; B Natriuretic Peptide 65.3 01/02/2022: BUN 15; Creatinine, Ser 0.79; Hemoglobin 11.9; Platelets 196; Potassium 3.5; Sodium 140    Lipid Panel Lab Results  Component Value Date   CHOL 145 11/25/2021   HDL 57 11/25/2021   LDLCALC 77 11/25/2021   TRIG 53 11/25/2021      Wt Readings from Last 3 Encounters:  01/09/22 166 lb (75.3 kg)  12/31/21 170 lb (77.1 kg)  12/17/21 169 lb 12.1 oz (77 kg)       ASSESSMENT AND PLAN:  Problem List Items Addressed This Visit       Cardiology Problems   Pulmonary embolism (HCC)   Relevant Medications   spironolactone (ALDACTONE) 25 MG tablet   SVT (supraventricular tachycardia) (HCC) - Primary   Relevant Medications   spironolactone (ALDACTONE) 25 MG tablet   Other Relevant Orders   EKG 12-Lead     Other   COPD exacerbation (HCC)   Acute on chronic  respiratory failure with hypoxia (HCC)  SVT For now recommend he move the diltiazem extended release to the evening given low blood pressure For any orthostasis symptoms we will decrease diltiazem ER down to 120 daily He is having some orthostasis symptoms, likely exacerbated by weight loss, recent addition of prostate medication  Pulmonary embolism Likely in the setting of being sedentary during COVID-19 infection No right heart strain, small on CT On Eliquis.  Pulmonary has decreased the dose  Chronic respiratory distress Recent hospitalization for COPD exacerbation Long history of smoking, COPD, recent COVID, no PE On oxygen 3 L, Recent hospitalization discussed in detail    Total encounter time more than 40 minutes  Greater than 50% was spent in counseling and coordination of care with the patient    Signed, Esmond Plants, M.D., Ph.D. [place], [place]

## 2022-01-15 ENCOUNTER — Telehealth: Payer: Self-pay | Admitting: Cardiovascular Disease

## 2022-01-15 DIAGNOSIS — T461X5A Adverse effect of calcium-channel blockers, initial encounter: Secondary | ICD-10-CM

## 2022-01-15 DIAGNOSIS — I471 Supraventricular tachycardia: Secondary | ICD-10-CM

## 2022-01-15 MED ORDER — DILTIAZEM HCL ER COATED BEADS 120 MG PO CP24: 120.0000 mg | ORAL_CAPSULE | Freq: Every day | ORAL | 3 refills | Status: DC

## 2022-01-15 NOTE — Telephone Encounter (Signed)
Patient was in last week. Pt c/o medication issue:  1. Name of Medication: Diltiazem  2. How are you currently taking this medication (dosage and times per day)? 180 mg once a day  3. Are you having a reaction (difficulty breathing--STAT)?   4. What is your medication issue? Too low, has been having some dizzyness.Dr. Rockey Situ said he may need to switch back to 120 mg.   Wife states she gave him a 120 mg yesterday, and his BP didn't go up any.   Please call to discuss.   BP this morning 112/66 Normally been staying in the "low teens"

## 2022-01-15 NOTE — Telephone Encounter (Signed)
Able to return pt's call to discuss his diltiazem 180 mg and reducing back to '120mg'$ . Per Dr. Donivan Scull OV notes from 01/09/2022 ? ?Try the diltiazem in the PM ?For low pressure, we may need to decrease the diltiazem er to 120 mg daily ? ?Advised can send in for diltiazem 120 mg if did well with that instead of the 180. Pt is very thankful, will continue to monitor BP/HR. Will call back with any further concerns. ? ?Diltiazem 120 mg daily sent in to [facility] , med list updated.  ?

## 2022-01-17 ENCOUNTER — Encounter: Payer: Self-pay | Admitting: Cardiovascular Disease

## 2022-01-17 NOTE — Telephone Encounter (Signed)
Pt c/o medication issue:  1. Name of Medication: diltiazem  2. How are you currently taking this medication (dosage and times per day)? 120 mg once a day  3. Are you having a reaction (difficulty breathing--STAT)? no  4. What is your medication issue? Feeling flushed still, after the doseage change .   Patient gives verbal okay to speak with his wife when the call is returned, she is not on DPR.

## 2022-01-17 NOTE — Telephone Encounter (Signed)
Flushing ?a) Incidence: 1.4% [115]. ?b) In clinical trials with other diltiazem formulations involving more than 3200 patients, the incidence of flushing was 1.4% [115]. ?

## 2022-01-17 NOTE — Telephone Encounter (Signed)
Return call to wife, again reviewed medication with wife. Wife reports "the flushing only last about 20 mins after taking the medication then it goes away". Concern if he should continue taking the medication or he should stop altogether until response from Dr. Rockey Situ. ? ?Advised as the flushing is short lived after taking the medication, would try to continue until Dr. Rockey Situ able to give instructions, could try OTC 12.5 mg benadryl with medication to see if that reduce the flushing reaction. ? ?Wife able to confirm that pt does not have sob, mouth/lip swelling, no difficulty swallowing or speaking, no rash/hives, or itching associated with medication. Only 20 mins of flushing after taking then resides on its own.  ? ?Assured wife that message has been sent to Dr. Rockey Situ, she and pt will receive a phone call with Dr. Donivan Scull recommendations. Gollan in clinic and has hospital rounds and cath lab interventions, call back could be early next week as Dr. Rockey Situ cannot check his baskey daily d/t high patient censes.  ? ?Wife verbalized understanding, will try to have Mr. Real continue diltiazem 120 mg in the evening, will use 12.5 mg benadryl to see if that helps, also will monitor his BP/HR. Otherwise, will await call back with further instructions.  ?

## 2022-01-17 NOTE — Telephone Encounter (Signed)
Return call to Mr. Bosques, he gave verbal consent to speak with wife Caren Griffins via phone.  ? ?Mr. Cato reports still feeling "flushed to chest and arms, at times to the face". When asked about CP, he  states "It is not chest pain". Pt reports has reduce his diltiazem to 120 mg daily, but still reports flushing after taking his med. ? ?Reports takes diltiazem in the evening and reports no other medication are taken with the diltiazem. Pt reports does not want to take medication anymore, would like something different to replace med with.  ? ?Educated pt on significance of diltiazem and need to monitor BP and HR. Wife reports has been monitoring his BP and HR, has not wrote them down "but they haven't concern me" per wife. Suggest trying to pull BP/HR log from monitor memory storage if she could collect those and call back to report those numbers, especially if pt will not be taking the medication. Wife reports understanding. ? ?Advised will send Dr. Rockey Situ another note as FYI and his recommendation for an alternative medication.. Otherwise all questions were address and no additional concerns at this time. Pt and wife thankful for the return call and advice. Agreeable to plan, will call back for anything further.   ? ?

## 2022-01-17 NOTE — Telephone Encounter (Signed)
Patient's wife is calling back states "we feel uneasy about the information that was given to [place]". Please call to discuss.

## 2022-01-17 NOTE — Telephone Encounter (Signed)
This encounter was created in error - please disregard.

## 2022-01-22 MED ORDER — BISOPROLOL FUMARATE 5 MG PO TABS: 5.0000 mg | ORAL_TABLET | Freq: Two times a day (BID) | ORAL | 3 refills | Status: DC

## 2022-01-22 NOTE — Telephone Encounter (Signed)
Was able to reach back out to pt's wife this am to f/u on diltiazem reaction of flushing, wife reports pt did well with taking half a OTC benadryl with diltiazem and had no flushing, reports "did well with it".  ? ?Advised on Dr. Donivan Scull reccommendations ? ?Would consider change to the medication, hold the diltiazem extended release 120  ?Would start bisoprolol 5 mg twice daily.  In effort to minimize SVT  ? ?Thx  ?TG  ? ?Mr. Jupiter and wife agrees to try new medication bisoprolol 5 mg BID to see how he does, will place diltiazem on his allergy list for future FYI.  ? ?[facility], wife and pt thankful for following up regarding this matter, will call back with any future concerns.  ?

## 2022-01-22 NOTE — Addendum Note (Signed)
Addended by: Wynema Birch on: 01/22/2022 08:28 AM ? ? Modules accepted: Orders ? ?

## 2022-01-23 ENCOUNTER — Telehealth: Payer: Self-pay | Admitting: Cardiovascular Disease

## 2022-01-23 NOTE — Telephone Encounter (Signed)
Pt schedule for 3/15 at 2:20 with Ignacia Bayley, NP.  Can discuss BP and medication adjustments at that time.  ? ?Noted per pt call BP over the past several days ?This morning -96/53  ?Yesterday - 100/78 ?Sunday- 107/66 ?Saturday -122/70 ? ?Pt has hx of low BP, noted in pt's chart since Jan 2023 trend of soft pressures ?1/23 100/62 ?1/25 98/60 ?2/4 90/76 ?2/28 106/54 ?All in whitch pt was on diltiazem ? ?Pt switch to Bisoprolol 5 mg BID yesterday d/t side affect of diltiazem of body flushing.  ? ? ?

## 2022-01-23 NOTE — Telephone Encounter (Signed)
Pt c/o BP issue: STAT if pt c/o blurred vision, one-sided weakness or slurred speech  1. What are your last 5 BP readings?  This morning -96/53 HR Yesterday - 100/78 Sunday- 107/66 Saturday -122/70  2. Are you having any other symptoms (ex. Dizziness, headache, blurred vision, passed out)? No symptoms  3. What is your BP issue? Too low  States Dr. Rockey Situ changed his BP medication.

## 2022-01-23 NOTE — Telephone Encounter (Signed)
Patient declined 840 am opening too early .  Scheduled next opening in April.  ? ?Wife states she needs an answer on med parameters in the interim .  ?

## 2022-01-24 ENCOUNTER — Ambulatory Visit: Payer: Medicare Other | Admitting: Nurse Practitioner

## 2022-01-24 ENCOUNTER — Encounter: Payer: Self-pay | Admitting: Nurse Practitioner

## 2022-01-24 ENCOUNTER — Other Ambulatory Visit: Payer: Self-pay

## 2022-01-24 VITALS — BP 98/60 | HR 60 | Ht 69.0 in | Wt 163.5 lb

## 2022-01-24 DIAGNOSIS — I2699 Other pulmonary embolism without acute cor pulmonale: Secondary | ICD-10-CM

## 2022-01-24 DIAGNOSIS — R001 Bradycardia, unspecified: Secondary | ICD-10-CM

## 2022-01-24 DIAGNOSIS — J9611 Chronic respiratory failure with hypoxia: Secondary | ICD-10-CM | POA: Diagnosis not present

## 2022-01-24 DIAGNOSIS — I471 Supraventricular tachycardia: Secondary | ICD-10-CM

## 2022-01-24 DIAGNOSIS — R031 Nonspecific low blood-pressure reading: Secondary | ICD-10-CM | POA: Diagnosis not present

## 2022-01-24 DIAGNOSIS — I5032 Chronic diastolic (congestive) heart failure: Secondary | ICD-10-CM

## 2022-01-24 MED ORDER — BISOPROLOL FUMARATE 5 MG PO TABS: 2.5000 mg | ORAL_TABLET | Freq: Two times a day (BID) | ORAL | 3 refills | Status: DC

## 2022-01-24 NOTE — Progress Notes (Signed)
Office Visit    Patient Name: Dylan Weber Date of Encounter: 01/24/2022  Primary Care Provider:  Alm Bustard, NP Primary Cardiologist:  Dylan Nordmann, MD  Chief Complaint    88 year old male with a history of COVID-19 infection December 2022, PSVT, remote tobacco abuse, COPD/emphysema, and recent admission for presyncope, PE, and right lower extremity DVT, who presents for follow-up related to low blood pressures.  Past Medical History    Past Medical History:  Diagnosis Date   COPD (chronic obstructive pulmonary disease) (HCC)    Coronary artery calcification seen on CT scan 11/17/2021   COVID-19 10/2021   Diastolic dysfunction    a. 11/2021 Echo: EF 55-60%, no rwma, GrI DD, nl RV fxn. Mild AI.   GERD (gastroesophageal reflux disease)    Pulmonary embolism (HCC)    a. 11/2021 CTA Chest: small peripheral PE in RLL branch of PA-->Eliquis.   Right leg DVT (HCC)    a. 11/2021 LE U/S: occlusive R calf vein DVT in R peroneal vein-->eliquis.   SVT (supraventricular tachycardia) (HCC)    a. Dx 10/2021 in setting of COVID infxn.   Past Surgical History:  Procedure Laterality Date   APPENDECTOMY     CHOLECYSTECTOMY      Allergies  Allergies  Allergen Reactions   Diltiazem Other (See Comments)    Flushing/redness to body   Prednisone Other (See Comments)    jittery   Amoxicillin Rash   Sulfamethoxazole-Trimethoprim Rash    History of Present Illness    88 year old male with the above past medical history including recent COVID-19 infection (December 2022), PSVT, remote tobacco abuse, COPD/emphysema, presyncope, PE, and right lower extremity DVT.  In December 2022, he was admitted with respiratory failure and COVID-19 infection.  During admission he was noted to have PSVT, which was managed with oral diltiazem and beta-blocker.  He was discharged home on oxygen and followed up with pulmonology in the outpatient setting on December 29, with ongoing dyspnea.  In the  setting of elevated D-dimer during his COVID admission, CT of the chest was scheduled and performed on January 6, showing small peripheral PE in the right lower lobe branch of the pulmonary artery.  He was prescribed Eliquis but unfortunately, was not taking.  He experienced presyncope on January 13, prompting him to present to the [facility] ED.  There, he had runs of SVT and metoprolol was added.  Lower extremity ultrasound was performed and revealed right lower extremity DVT.  Eliquis was started.  He was admitted and echocardiogram showed an EF of 55 to 60% without regional motion abnormalities and grade 1 diastolic dysfunction.  He continues report dyspnea on exertion at office follow-up on January 25.  Chest x-ray showed emphysema without superimposed acute process.  He presented to the emergency department on February 4 with dyspnea and anxiety.  CTA of the chest was negative for PE.  Coronary calcifications and aortic atherosclerosis were noted.  He was treated for anxiety and subsequently discharged.  He was seen again in the ED on February 19 in the setting of dyspnea and hypoxia with saturations of 82% on 4 L at home.  Saturations were in the low 80s in the ED.  Initially quiring high flow nasal cannula.  CTA of the chest again showed no PE or infiltrate but did show severe emphysema.  He was treated with steroids, nebulizers, and inhalers, and empiric antibiotics was subsequently discharged home on February 21.  Mr. Dylan Weber was seen by Dr. Mariah Weber  on February 28, and it was noted that breathing was back to baseline.  He was having some orthostatic symptoms and is diltiazem ER was reduced to 120 mg daily and he was advised to take this in the evening.  Unfortunately, he developed severe flushing on the 120 mg dose and this was discontinued and replaced with bisoprolol 5 mg twice daily.  Since starting bisoprolol, he has felt well but his wife has noticed blood pressures trending in the mid 90s to low 100s,  as well as heart rates trending in the 50s to low 60s.  She became concerned about this and contacted our office and was added onto my schedule today.  Mr. Dylan Weber says that he has been feeling well.  He has chronic dyspnea on exertion and uses oxygen at 2 L/min but otherwise, feels that he has tolerated bisoprolol well.  He has not had any chest pain and denies palpitations, PND, orthopnea, dizziness, syncope, edema, or early satiety.  He has not taken bisoprolol since yesterday morning and his blood pressure in clinic today is 98/60 with a heart rate of 60.  Home Medications    Current Outpatient Medications  Medication Sig Dispense Refill   acetaminophen (TYLENOL) 650 MG CR tablet Take 650 mg by mouth as needed for pain.     albuterol (VENTOLIN HFA) 108 (90 Base) MCG/ACT inhaler Inhale 2 puffs into the lungs every 6 (six) hours as needed for wheezing or shortness of breath. 8 g 0   ALPRAZolam (XANAX) 0.25 MG tablet Take 1 tablet (0.25 mg total) by mouth 3 (three) times daily as needed for anxiety. 6 tablet 0   apixaban (ELIQUIS) 5 MG TABS tablet Take 2 tablets (10 mg total) by mouth 2 (two) times daily for 6 days, THEN 1 tablet (5 mg total) 2 (two) times daily. 192 tablet 0   ascorbic acid (VITAMIN C) 500 MG tablet Take 1 tablet (500 mg total) by mouth daily. 30 tablet 0   bacitracin ointment Apply topically 2 (two) times daily.     BREO ELLIPTA 200-25 MCG/ACT AEPB Inhale 1 puff into the lungs daily.     guaiFENesin (MUCINEX) 600 MG 12 hr tablet Take 600 mg by mouth daily.     Ipratropium-Albuterol (COMBIVENT) 20-100 MCG/ACT AERS respimat Inhale 2 puffs into the lungs 2 (two) times daily.     Multiple Vitamin (MULTIVITAMIN) capsule Take 1 capsule by mouth daily.     pantoprazole (PROTONIX) 40 MG tablet Take 40 mg by mouth daily.     pseudoephedrine-acetaminophen (TYLENOL SINUS) 30-500 MG TABS tablet Take 1 tablet by mouth as needed.     spironolactone (ALDACTONE) 25 MG tablet Take 25 mg by mouth  every other day.     tamsulosin (FLOMAX) 0.4 MG CAPS capsule Take 0.4 mg by mouth every other day. Take 30 minutes after the same meal everyday     torsemide (DEMADEX) 10 MG tablet Take 10 mg by mouth daily.     triamcinolone (NASACORT) 55 MCG/ACT AERO nasal inhaler Place 2 sprays into the nose daily.     zinc sulfate 220 (50 Zn) MG capsule Take 1 capsule (220 mg total) by mouth daily. 30 capsule 0   bisoprolol (ZEBETA) 5 MG tablet Take 0.5 tablets (2.5 mg total) by mouth in the morning and at bedtime. 90 tablet 3   doxepin (SINEQUAN) 10 MG capsule Take 10 mg by mouth at bedtime. (Patient not taking: Reported on 01/09/2022)     predniSONE (STERAPRED UNI-PAK 21  TAB) 10 MG (21) TBPK tablet Start 60 mg po daily, taper 10 mg daily until finish (Patient not taking: Reported on 01/09/2022) 21 tablet 0   No current facility-administered medications for this visit.     Review of Systems    He has chronic dyspnea on exertion as outlined above.  He denies chest pain, palpitations, PND, orthopnea, dizziness, syncope, edema, or early satiety.  All other systems reviewed and are otherwise negative except as noted above.    Physical Exam    VS:  BP 98/60 (BP Location: Left Arm, Patient Position: Sitting, Cuff Size: Normal)   Pulse 60   Ht 5\' 9"  (1.753 m)   Wt 163 lb 8 oz (74.2 kg)   SpO2 98% Comment: 2 Liters of oxygen  BMI 24.14 kg/m  , BMI Body mass index is 24.14 kg/m.     GEN: Well nourished, well developed, in no acute distress. HEENT: normal. Neck: Supple, no JVD, carotid bruits, or masses. Cardiac: RRR, no murmurs, rubs, or gallops. No clubbing, cyanosis, edema.  Radials 2+/PT 1+ and equal bilaterally.  Respiratory:  Respirations regular and unlabored, diminished breath sounds bilaterally. GI: Soft, nontender, nondistended, BS + x 4. MS: no deformity or atrophy. Skin: warm and dry, no rash. Neuro:  Strength and sensation are intact. Psych: Normal affect.  Accessory Clinical Findings     ECG personally reviewed by me today -regular sinus rhythm, 60, baseline artifact, leftward axis, LVH, incomplete right bundle branch block, prior inferior and anteroseptal infarct- no acute changes.  Lab Results  Component Value Date   WBC 10.9 (H) 01/02/2022   HGB 11.9 (L) 01/02/2022   HCT 35.5 (L) 01/02/2022   MCV 94.4 01/02/2022   PLT 196 01/02/2022   Lab Results  Component Value Date   CREATININE 0.79 01/02/2022   BUN 15 01/02/2022   NA 140 01/02/2022   K 3.5 01/02/2022   CL 104 01/02/2022   CO2 27 01/02/2022   Lab Results  Component Value Date   ALT 21 12/31/2021   AST 24 12/31/2021   ALKPHOS 64 12/31/2021   BILITOT 1.0 12/31/2021   Lab Results  Component Value Date   CHOL 145 11/25/2021   HDL 57 11/25/2021   LDLCALC 77 11/25/2021   TRIG 53 11/25/2021   CHOLHDL 2.5 11/25/2021    Lab Results  Component Value Date   HGBA1C 5.8 (H) 11/24/2021    Assessment & Plan    1.  Bradycardia with low blood pressures: Since being placed on bisoprolol 5 mg twice daily, patient's wife has noticed heart rates in the 50s with blood pressures in the mid 90s to low 100s.  Patient is asymptomatic.  He has not taken bisoprolol since yesterday morning and his blood pressure today is 98/60 with a heart rate of 60.  Given his history of PSVT and frequent PACs, we would like him to be on some form of AV nodal blocking agent.  He was recently switched to bisoprolol from diltiazem secondary to flushing and low blood pressures.  I am going to reduce his bisoprolol 2.5 mg twice daily and they will continue to track his heart rates and blood pressures at home.  I did offer reassurance that in the setting of soft blood pressures and mild bradycardia, without hypotension or symptoms, he is safe to continue low-dose beta-blocker therapy.  2.  Chronic hypoxic respiratory failure/emphysema: Patient was admitted with COVID-19 in December 2022 and subsequently diagnosed with pulmonary embolism.  He is  now  on Eliquis.  He is requiring oxygen at 2 L/min around-the-clock, and notes that he has tolerated this well.  His dyspnea on exertion is stable.  He has not had any wheezing since initiating low-dose bisoprolol therapy.  He remains on inhaler therapy per pulmonology.  3.  Pulmonary embolism: Diagnosed in early January with subsequent diagnosis of right lower extremity DVT in the setting of noncompliance with Eliquis.  He has since been compliant with Eliquis.  4.  PSVT/frequent PACs: Quiescent on low-dose beta-blocker therapy.  5.  Coronary calcifications: Noted on prior CT scans.  He has no prior history of chest pain and normal LV function by echo in January.  Would have a low threshold to consider stress testing for any development of chest discomfort or worsening of dyspnea despite optimization of pulmonary medications.  We discussed this today.  6.  Chronic HFpEF: Diastolic dysfunction noted on echo in January.  Euvolemic on examination on spironolactone and low-dose torsemide.  Stable renal function and February.  7.  Disposition: Follow-up in clinic in 3 months or sooner if necessary.   Nicolasa Ducking, NP 01/24/2022, 5:57 PM

## 2022-01-24 NOTE — Patient Instructions (Signed)
Medication Instructions:  ?Your physician has recommended you make the following change in your medication:  ? ?DECREASE Bisoprolol to 2.5 mg twice a day ? ?*If you need a refill on your cardiac medications before your next appointment, please call your pharmacy* ? ? ?Lab Work: ?None ? ?If you have labs (blood work) drawn today and your tests are completely normal, you will receive your results only by: ?MyChart Message (if you have MyChart) OR ?A paper copy in the mail ?If you have any lab test that is abnormal or we need to change your treatment, we will call you to review the results. ? ? ?Testing/Procedures: ?None ? ? ?Follow-Up: ?At [facility], you and your health needs are our priority.  As part of our continuing mission to provide you with exceptional heart care, we have created designated Provider Care Teams.  These Care Teams include your primary Cardiologist (physician) and Advanced Practice Providers (APPs -  Physician Assistants and Nurse Practitioners) who all work together to provide you with the care you need, when you need it. ? ? ?Your next appointment:   ?July 10, 2022 at 2:20 pm with Dr. Rockey Situ ? ?The format for your next appointment:   ?In Person ? ?Provider:   ?Ida Rogue, MD ?

## 2022-02-02 ENCOUNTER — Other Ambulatory Visit: Payer: Self-pay

## 2022-02-02 ENCOUNTER — Emergency Department
Admission: EM | Admit: 2022-02-02 | Discharge: 2022-02-02 | Disposition: A | Discharge status: Home / Self Care | Payer: Medicare Other | Attending: Emergency Medicine | Admitting: Emergency Medicine

## 2022-02-02 DIAGNOSIS — I959 Hypotension, unspecified: Secondary | ICD-10-CM | POA: Insufficient documentation

## 2022-02-02 DIAGNOSIS — Z8616 Personal history of COVID-19: Secondary | ICD-10-CM | POA: Diagnosis not present

## 2022-02-02 DIAGNOSIS — J449 Chronic obstructive pulmonary disease, unspecified: Secondary | ICD-10-CM | POA: Diagnosis not present

## 2022-02-02 DIAGNOSIS — R031 Nonspecific low blood-pressure reading: Secondary | ICD-10-CM

## 2022-02-02 LAB — CBC WITH DIFFERENTIAL/PLATELET
Abs Immature Granulocytes: 0.15 10*3/uL — ABNORMAL HIGH (ref 0.00–0.07)
Basophils Absolute: 0 10*3/uL (ref 0.0–0.1)
Basophils Relative: 0 %
Eosinophils Absolute: 0 10*3/uL (ref 0.0–0.5)
Eosinophils Relative: 0 %
HCT: 37.8 % — ABNORMAL LOW (ref 39.0–52.0)
Hemoglobin: 12.4 g/dL — ABNORMAL LOW (ref 13.0–17.0)
Immature Granulocytes: 2 %
Lymphocytes Relative: 19 %
Lymphs Abs: 1.5 10*3/uL (ref 0.7–4.0)
MCH: 32.1 pg (ref 26.0–34.0)
MCHC: 32.8 g/dL (ref 30.0–36.0)
MCV: 97.9 fL (ref 80.0–100.0)
Monocytes Absolute: 1.5 10*3/uL — ABNORMAL HIGH (ref 0.1–1.0)
Monocytes Relative: 19 %
Neutro Abs: 4.6 10*3/uL (ref 1.7–7.7)
Neutrophils Relative %: 60 %
Platelets: 183 10*3/uL (ref 150–400)
RBC: 3.86 MIL/uL — ABNORMAL LOW (ref 4.22–5.81)
RDW: 14.1 % (ref 11.5–15.5)
WBC: 7.7 10*3/uL (ref 4.0–10.5)
nRBC: 0 % (ref 0.0–0.2)

## 2022-02-02 LAB — BASIC METABOLIC PANEL
Anion gap: 11 (ref 5–15)
BUN: 25 mg/dL — ABNORMAL HIGH (ref 8–23)
CO2: 27 mmol/L (ref 22–32)
Calcium: 9.2 mg/dL (ref 8.9–10.3)
Chloride: 100 mmol/L (ref 98–111)
Creatinine, Ser: 0.9 mg/dL (ref 0.61–1.24)
GFR, Estimated: >60 mL/min (ref >60)
Glucose, Bld: 104 mg/dL — ABNORMAL HIGH (ref 70–99)
Potassium: 3.6 mmol/L (ref 3.5–5.1)
Sodium: 138 mmol/L (ref 135–145)

## 2022-02-02 NOTE — Discharge Instructions (Signed)
You have a normal exam at this time. You should follow-up with Dr. Rockey Situ for continued care. Consider taking your furosemide every other day, or as needed for leg edema. Monitor daily BP readings as discussed.  ?

## 2022-02-02 NOTE — ED Triage Notes (Addendum)
Patient to ER via Pov from home, reports bp was reading 76'R systolic multiple times. Drank gatorade PTA. Reports feeling well. Unsymptomatic, carries oxygen tank into triage. ? ?Patient on 2L via  at baseline. Patient with COPD. ? ?BP taken twice in triage, right arm:121/79, left arm: 90/59 ?

## 2022-02-02 NOTE — ED Provider Notes (Signed)
? ? ?[facility] ?Emergency Department Provider Note ? ? ? ? Event Date/Time  ? First MD Initiated Contact with Patient 02/02/22 1516   ?  (approximate) ? ? ?History  ? ?Hypotension ? ? ?HPI ? ?Dylan Weber is a 88 y.o. male with a history of COVID-19 infection December 2022, PSVT, remote tobacco abuse, COPD/emphysema, and recent admission for presyncope, PE, and right lower extremity DVT on Eliquis, who presents for evaluation of reported to low blood pressures.  The patient was seen by his cardiologist a week and a half earlier for the same complaint.  An adjustment was made to his bisoprolol at that time, decreasing his dose to 2.5 mg twice daily.  It is further noted on chart review, that the cardiology NP reassured the patient that despite his soft blood pressures and mildly bradycardic heart rate, he was otherwise asymptomatic without reports of syncope, weakness, or falls.  Patient was to continue to monitor blood pressure readings and follow-up with the cardiology office in 3 months.  He presents today accompanied by his wife, who reports systolic readings in the 83T.  Patient again reports that he is asymptomatic denying any complaints of headache, weakness, dizziness, or syncope at this time.  Patient continues to use his chronic O2 supplementation. ?Patient has a visit with Dr. Lanney Gins yesterday for routine follow-up. Dr. Loni Muse made the decision to discontinue the bisoprolol and spironolactone.  ?  ? ? ?Physical Exam  ? ?Triage Vital Signs: ?ED Triage Vitals  ?Enc Vitals Group  ?   BP 02/02/22 1508 121/79  ?   Pulse Rate 02/02/22 1508 78  ?   Resp 02/02/22 1508 18  ?   Temp 02/02/22 1513 98.6 ?F (37 ?C)  ?   Temp Source 02/02/22 1508 Oral  ?   SpO2 02/02/22 1508 92 %  ?   Weight --   ?   Height 02/02/22 1508 '5\' 9"'$  (1.753 m)  ?   Head Circumference --   ?   Peak Flow --   ?   Pain Score 02/02/22 1508 0  ?   Pain Loc --   ?   Pain Edu? --   ?   Excl. in [place]? --   ? ? ?Most recent vital  signs: ?Vitals:  ? 02/02/22 1513 02/02/22 1626  ?BP:  101/79  ?Pulse:  74  ?Resp:  17  ?Temp: 98.6 ?F (37 ?C)   ?SpO2:  96%  ? ? ?General Awake, no distress.  ?CV:  Good peripheral perfusion. RRR, no murmurs, rubs, or gallops ?RESP:  Normal effort. CTA ?ABD:  No distention. soft ? ? ?ED Results / Procedures / Treatments  ? ?Labs ?(all labs ordered are listed, but only abnormal results are displayed) ?Labs Reviewed  ?BASIC METABOLIC PANEL - Abnormal; Notable for the following components:  ?    Result Value  ? Glucose, Bld 104 (*)   ? BUN 25 (*)   ? All other components within normal limits  ?CBC WITH DIFFERENTIAL/PLATELET - Abnormal; Notable for the following components:  ? RBC 3.86 (*)   ? Hemoglobin 12.4 (*)   ? HCT 37.8 (*)   ? Monocytes Absolute 1.5 (*)   ? Abs Immature Granulocytes 0.15 (*)   ? All other components within normal limits  ? ? ? ?EKG ? ?Vent. rate 71 BPM ?PR interval 196 ms ?QRS duration 114 ms ?QT/QTcB 396/430 ms ?P-R-T axes 60 -7 67 ?Sinus rhythm with occ PVCs ?No STEMI ? ?  RADIOLOGY ? ? ?No results found. ? ? ?PROCEDURES: ? ?Critical Care performed: No ? ?Procedures ? ? ?MEDICATIONS ORDERED IN ED: ?Medications - No data to display ? ? ?IMPRESSION / MDM / ASSESSMENT AND PLAN / ED COURSE  ?I reviewed the triage vital signs and the nursing notes. ?             ?               ? ?Differential diagnosis includes, but is not limited to, symptomatic bradycardia, hypotension, dehydration, sepsis ? ?Patient to the ED with his wife with concern for low blood pressure readings over the last week.  Patient was recently evaluated by his cardiologist and told to decrease the dose of his bisoprolol.  Patient was subsequently evaluated by his pulmonary provider yesterday, and was told to discontinue the bisoprolol and spironolactone.  Patient wife is concerned because he had some intermittent low readings.  Patient himself denies any symptoms including chest pain, shortness of breath, weakness, or syncope.   Patient's exam is again reassuring with normal blood pressures.  He did have a low blood pressure reading during orthostatic testing with standing.  Patient was again asymptomatic at that time.  Patient's diagnosis is consistent with hypotension likely due to his beta-blocker.  Patient is stable for discharge as he is without complaints of symptomatic hypertension and has no bradycardia on exam.  Patient is to follow up with cardiologist and pulmonologist as needed or otherwise directed. Patient is given ED precautions to return to the ED for any worsening or new symptoms. ? ? ? ?FINAL CLINICAL IMPRESSION(S) / ED DIAGNOSES  ? ?Final diagnoses:  ?Low blood pressure reading  ? ? ? ?Rx / DC Orders  ? ?ED Discharge Orders   ? ? None  ? ?  ? ? ? ?Note:  This document was prepared using Dragon voice recognition software and may include unintentional dictation errors. ? ?  ?Melvenia Needles, PA-C ?02/03/22 0007 ? ?  ?Nance Pear, MD ?02/08/22 1031 ? ?

## 2022-02-06 ENCOUNTER — Ambulatory Visit: Payer: Medicare Other | Admitting: Cardiovascular Disease

## 2022-02-07 ENCOUNTER — Telehealth: Payer: Self-pay | Admitting: Cardiovascular Disease

## 2022-02-07 NOTE — Telephone Encounter (Signed)
Patient's wife states patient is feeling flushed and "hot feeling" that hits him all of the sudden and then goes away. States this morning was the worst he has had.  Pt c/o BP issue: STAT if pt c/o blurred vision, one-sided weakness or slurred speech  1. What are your last 5 BP readings?  This morning 134/94 HR 107 this was just after his episode. Yesterday -last night 112/75 HR 99. Morning 110/73 HR 98  2. Are you having any other symptoms (ex. Dizziness, headache, blurred vision, passed out)? No other symptoms  3. What is your BP issue?  HR elevated  Patient's wife states the pulmonologist had patient stop his Bisoprolol.  STAT if HR is under 50 or over 120 (normal HR is 60-100 beats per minute)  What is your heart rate? 107  Do you have a log of your heart rate readings (document readings)? Yesterday 99 and 98  Do you have any other symptoms? Just feeling flushed and hot, but then goes away. Today has been the worst. States he has had some sinus pressure and nose running.  States he took an antibiotic this morning.

## 2022-02-07 NOTE — Telephone Encounter (Signed)
Called to speak with patient, patient gave verbal consent for me to speak with wife.  ? ?Patient's wife reports that he saw Dr. Lanney Gins on Thursday and MD stopped his bisoprolol and spironolactone due to low BPs. Per wife, patient was asymptomatic at the time.   ? ?BP continued to be low on Friday and then into Saturday but started to come up. Wife concerned that this morning his blood pressure and HR were up just after he had another "flushing" episode where he feels like he's "going to explode." Explained to wife that it wasn't unusual for pt's BP and HR to be up after an episode of something that makes him very uncomfortable.  ? ?Wife reports that he's continued to have these episodes and this morning the episode was the worst one; patient felt the worst and it lasted longer than it has.  ? ?Told wife that I will forward this message to Dr. Rockey Situ, but that he may not respond until next week, as he doesn't check his inbasket daily d/t high census in the clinic and hospital.  ? ?Wife verbalized understanding and voiced appreciation for the call.  ?

## 2022-02-07 NOTE — Telephone Encounter (Signed)
Patient wife calling for update on advise.  ? ?She gave patient 2.5 mg of bisoprolol as he had another flushing episode .  ? ?Patient bp was 144/86 and came down to 124/79 after meds.  ? ?Patient requesting a response .  Scheduled 4-3 with Gollan .  ?

## 2022-02-08 NOTE — Telephone Encounter (Signed)
Recommendations attached to the 02/07/22 phone note that was started for this problem.  ? ?Will close this encounter.  ? ?

## 2022-02-08 NOTE — Telephone Encounter (Addendum)
The patient was last seen in office on 01/24/22 by Ignacia Bayley, NP.  ?To Gerald Stabs, NP to review further and advise. ? ? ?

## 2022-02-08 NOTE — Telephone Encounter (Signed)
It is difficult to know what is causing these flushing spells.  Given prior concern related to hypotension, I think it is reasonable to remain off of bisoprolol and spironolactone.  Blood pressures are bound to fluctuate and given recent history of sustained low blood pressures, I would warn against overreacting to moderate elevations of blood pressure in the 140s.  Dylan Weber does have a history of SVT and frequent PACs, which may also cause flushing-like symptoms and thus I question if arrhythmia is responsible for those symptoms.  It may be reasonable to place a ZIO monitor for 2 weeks to at least better understand what his heart rhythm is during these episodes, as if Dylan Weber is experiencing arrhythmias, this will help guide therapy. ?

## 2022-02-08 NOTE — Telephone Encounter (Signed)
Message received from provider that were attached to an erroneous encounter. ? ?Theora Gianotti, NP at 02/08/2022 12:20 PM ? ? ?Status: Signed  ?It is difficult to know what is causing these flushing spells.  Given prior concern related to hypotension, I think it is reasonable to remain off of bisoprolol and spironolactone.  Blood pressures are bound to fluctuate and given recent history of sustained low blood pressures, I would warn against overreacting to moderate elevations of blood pressure in the 140s.  He does have a history of SVT and frequent PACs, which may also cause flushing-like symptoms and thus I question if arrhythmia is responsible for those symptoms.  It may be reasonable to place a ZIO monitor for 2 weeks to at least better understand what his heart rhythm is during these episodes, as if he is experiencing arrhythmias, this will help guide therapy.  ?  ? ?

## 2022-02-08 NOTE — Telephone Encounter (Signed)
I called and spoke with the patient's wife regarding Ignacia Bayley, NP's recommendations as stated below. ?Per Mrs. Leyda, the patient has taken bisoprolol 2.5 mg today and his BP/ HR are controlled- HR currently 56 bpm. ? ?She states the patient is very anxious about having another "spell." ? ?I have advised Mrs. Zarling that we just need to be cautious with using the bisprolol as we do not want BP/ HR's running too low that may cause an episode of syncope.  ? ?Mrs. Coral voices understanding.  ? ?The patient is currently scheduled to see Dr. Rockey Situ on Monday 02/12/22. ?I have advised that Dr. Rockey Situ has opened up a few appointments for tomorrow if they are willing to come sooner. ? ?Per Mrs. Sanon, they are agreeable with coming to the clinic tomorrow (3/31) at 2:40 pm for further evaluation. ? ?Appt has been re/s from 4/3 to 3/31 with Dr. Rockey Situ. ? ?Mrs. Gangwer will use bisoprolol only if needed prior to the patient's appointment tomorrow.  ? ?

## 2022-02-09 ENCOUNTER — Ambulatory Visit: Payer: Medicare Other | Admitting: Cardiovascular Disease

## 2022-02-09 ENCOUNTER — Encounter: Payer: Self-pay | Admitting: Cardiovascular Disease

## 2022-02-09 VITALS — BP 110/60 | HR 70 | Ht 69.0 in | Wt 163.0 lb

## 2022-02-09 DIAGNOSIS — I5032 Chronic diastolic (congestive) heart failure: Secondary | ICD-10-CM

## 2022-02-09 DIAGNOSIS — J9611 Chronic respiratory failure with hypoxia: Secondary | ICD-10-CM

## 2022-02-09 DIAGNOSIS — R031 Nonspecific low blood-pressure reading: Secondary | ICD-10-CM | POA: Diagnosis not present

## 2022-02-09 DIAGNOSIS — I2699 Other pulmonary embolism without acute cor pulmonale: Secondary | ICD-10-CM

## 2022-02-09 DIAGNOSIS — R001 Bradycardia, unspecified: Secondary | ICD-10-CM

## 2022-02-09 DIAGNOSIS — I471 Supraventricular tachycardia: Secondary | ICD-10-CM | POA: Diagnosis not present

## 2022-02-09 DIAGNOSIS — J441 Chronic obstructive pulmonary disease with (acute) exacerbation: Secondary | ICD-10-CM

## 2022-02-09 MED ORDER — TORSEMIDE 10 MG PO TABS: ORAL_TABLET | ORAL | 11 refills | Status: DC

## 2022-02-09 MED ORDER — BISOPROLOL FUMARATE 5 MG PO TABS: ORAL_TABLET | ORAL | 3 refills | Status: DC

## 2022-02-09 NOTE — Progress Notes (Signed)
Cardiology Office Note ? ?Date:  02/09/2022  ? ?ID:  Dylan Weber, DOB 06/11/1933, MRN [id number] ? ?PCP:  Peggye Form, NP  ? ?Chief Complaint  ?Patient presents with  ? Other  ?  Patient has many concerns to discuss at today's visit, he would like to discuss medications and heart rate.Medications verbally reviewed with patient.  ? ? ?HPI:  ?Dylan Weber is a 88 year old male with history of paroxysmal SVT, PE, DVT COPD on 2 l home oxygen, former smoker x50+ years,  ?Admission to the hospital January 2023 feeling faint, presyncope/SVT ?COVID ?CT showing PE, lower extremity ultrasound was obtained showing DVT in the right lower extremity.,  Started on Eliquis ?Who presents for follow-up in the office for his PE and SVT/atrial tachycardia ? ?Last seen by myself in clinic January 09, 2022 ?On that visit, diltiazem moved to the evening for orthostasis, dose down to 120 daily ?He did confirm recent weight loss ? ?Seen by one of our providers January 24, 2022, bisoprolol dosing decreased at that time ? ?Was seen by pulmonary February 01, 2022, for low blood pressure ,bisoprolol and spironolactone held ? ?Was seen in the hospital February 02, 2022 ?Reported systolic pressures at home in the 80s, was asymptomatic ?On the ER visit blood pressure 121/79, repeat blood pressure 101/79 ? ?3/28 and 3/29, brief epsiodes ?Feels like body is "lighting up like christmas tree" ?Heart rate up to 100 , baseline 50 to 60 ?Restarted taking bisoprolol 2.5 daily, symptoms improved ? ?Constipation, ?Took  miralex/magnesium ?Not drinking very much ? ?Weight down 20 pounds, nonintentional ? ?Off tamsulosin past several days after it was suggested this might drop his blood pressure ?Has not noticed a change in his urinary habits ? ?EKG personally reviewed by myself on todays visit ?Normal sinus rhythm rate 70 bpm right bundle branch block, old inferior MI ? ?Other past medical history reviewed ?Copd exaerbation, hospitalized, d/c  01/02/22 ?Initially presented on 15 L HFNC, weaned down to 2 L ?steroids, nebs, inhalers and empiric antibiotics ? ?10/17/21 reports seeing pulmonary, COVID-19 positive, COPD exacerbation ?Treated with PACs Flovent and prednisone ? ?On October 31, 2021 presented to the emergency room with near syncope ?Saturations 84% on room air ?Was admitted overnight ?Treated for atrial tachycardia/SVT, started on Cardizem CD ? ?November 17, 2021 ?CT scan chest, small PE ? ?November 24, 2021 ?Admitted to the hospital with near syncope ?Lower extremity venous Doppler : occlusive right calf vein DVT in the right peroneal vein. No other ?DVT identified.  For tachycardia diltiazem extended release increased up to 180 mg daily ?Started on Eliquis at discharge ? ?Echocardiogram reviewed on today's visit ?November 25, 2021 ? 1. Left ventricular ejection fraction, by estimation, is 55 to 60%. The  ?left ventricle has normal function. The left ventricle has no regional  ?wall motion abnormalities. Left ventricular diastolic parameters are  ?consistent with Grade I diastolic  ?dysfunction (impaired relaxation).  ? 2. Right ventricular systolic function is normal. The right ventricular  ?size is not well visualized.  ? 3. The mitral valve is grossly normal. No evidence of mitral valve  ?regurgitation.  ? 4. The aortic valve was not well visualized. Aortic valve regurgitation  ?is mild.  ? ?PMH:   has a past medical history of COPD (chronic obstructive pulmonary disease) ([place]), Coronary artery calcification seen on CT scan (11/17/2021), COVID-19 (95/2841), Diastolic dysfunction, GERD (gastroesophageal reflux disease), Pulmonary embolism ([place]), Right leg DVT ([place]), and SVT (supraventricular tachycardia) ([place]). ? ?PSH:    ?  Past Surgical History:  ?Procedure Laterality Date  ? APPENDECTOMY    ? CHOLECYSTECTOMY    ? ? ?Current Outpatient Medications  ?Medication Sig Dispense Refill  ? acetaminophen (TYLENOL) 650 MG CR tablet Take 650 mg by mouth as  needed for pain.    ? ALPRAZolam (XANAX) 0.25 MG tablet Take 1 tablet (0.25 mg total) by mouth 3 (three) times daily as needed for anxiety. 6 tablet 0  ? apixaban (ELIQUIS) 2.5 MG TABS tablet Take 2.5 mg by mouth 2 (two) times daily.    ? bacitracin ointment Apply topically 2 (two) times daily.    ? bisoprolol (ZEBETA) 5 MG tablet Take 0.5 tablets (2.5 mg total) by mouth in the morning and at bedtime. 90 tablet 3  ? BREO ELLIPTA 200-25 MCG/ACT AEPB Inhale 1 puff into the lungs daily.    ? doxycycline (VIBRAMYCIN) 100 MG capsule Take 1 capsule by mouth 2 (two) times daily.    ? guaiFENesin (MUCINEX) 600 MG 12 hr tablet Take 600 mg by mouth daily.    ? Ipratropium-Albuterol (COMBIVENT) 20-100 MCG/ACT AERS respimat Inhale 2 puffs into the lungs 2 (two) times daily.    ? Multiple Vitamin (MULTIVITAMIN) capsule Take 1 capsule by mouth daily.    ? pantoprazole (PROTONIX) 40 MG tablet Take 40 mg by mouth daily.    ? pseudoephedrine-acetaminophen (TYLENOL SINUS) 30-500 MG TABS tablet Take 1 tablet by mouth as needed.    ? tamsulosin (FLOMAX) 0.4 MG CAPS capsule Take 0.4 mg by mouth every other day. Take 30 minutes after the same meal everyday    ? triamcinolone (NASACORT) 55 MCG/ACT AERO nasal inhaler Place 2 sprays into the nose daily.    ? albuterol (VENTOLIN HFA) 108 (90 Base) MCG/ACT inhaler Inhale 2 puffs into the lungs every 6 (six) hours as needed for wheezing or shortness of breath. (Patient not taking: Reported on 02/09/2022) 8 g 0  ? apixaban (ELIQUIS) 5 MG TABS tablet Take 2 tablets (10 mg total) by mouth 2 (two) times daily for 6 days, THEN 1 tablet (5 mg total) 2 (two) times daily. (Patient not taking: Reported on 02/09/2022) 192 tablet 0  ? ascorbic acid (VITAMIN C) 500 MG tablet Take 1 tablet (500 mg total) by mouth daily. (Patient not taking: Reported on 02/09/2022) 30 tablet 0  ? doxepin (SINEQUAN) 10 MG capsule Take 10 mg by mouth at bedtime. (Patient not taking: Reported on 02/09/2022)    ? predniSONE  (STERAPRED UNI-PAK 21 TAB) 10 MG (21) TBPK tablet Start 60 mg po daily, taper 10 mg daily until finish (Patient not taking: Reported on 02/09/2022) 21 tablet 0  ? torsemide (DEMADEX) 10 MG tablet Take 10 mg by mouth daily. (Patient not taking: Reported on 02/09/2022)    ? zinc sulfate 220 (50 Zn) MG capsule Take 1 capsule (220 mg total) by mouth daily. (Patient not taking: Reported on 02/09/2022) 30 capsule 0  ? ?No current facility-administered medications for this visit.  ? ? ? ?Allergies:   Diltiazem, Prednisone, Amoxicillin, and Sulfamethoxazole-trimethoprim  ? ?Social History:  The patient  reports that he has quit smoking. His smoking use included cigarettes. He has quit using smokeless tobacco. He reports current alcohol use of about 7.0 standard drinks per week. He reports that he does not use drugs.  ? ?Family History:   family history includes Brain cancer in his sister; Uterine cancer in his mother.  ? ? ?Review of Systems: ?Review of Systems  ?Constitutional: Negative.   ?HENT:  Negative.    ?Respiratory:  Positive for shortness of breath.   ?Cardiovascular: Negative.   ?Gastrointestinal: Negative.   ?Musculoskeletal: Negative.   ?Neurological: Negative.   ?Psychiatric/Behavioral: Negative.    ?All other systems reviewed and are negative. ? ? ?PHYSICAL EXAM: ?VS:  BP 110/60 (BP Location: Left Arm, Patient Position: Sitting, Cuff Size: Normal)   Pulse 70   Ht '5\' 9"'$  (1.753 m)   Wt 73.9 kg   SpO2 96% Comment: 2 Liters of Oxygen  BMI 24.07 kg/m?  , BMI Body mass index is 24.07 kg/m?Marland Kitchen ?Constitutional:  oriented to person, place, and time. No distress.  ?HENT:  ?Head: Grossly normal ?Eyes:  no discharge. No scleral icterus.  ?Neck: No JVD, no carotid bruits  ?Cardiovascular: Regular rate and rhythm, no murmurs appreciated ?Pulmonary/Chest: Clear with scattered Rales ?Abdominal: Soft.  no distension.  no tenderness.  ?Musculoskeletal: Normal range of motion ?Neurological:  normal muscle tone. Coordination  normal. No atrophy ?Skin: Skin warm and dry ?Psychiatric: normal affect, pleasant ? ?Recent Labs: ?10/31/2021: TSH 1.028 ?12/31/2021: ALT 21; B Natriuretic Peptide 65.3 ?02/02/2022: BUN 25; Creatinine, Ser 0.90; Hemoglobin 12.4; Platele

## 2022-02-09 NOTE — Patient Instructions (Addendum)
Medication Instructions:  ?- Your physician has recommended you make the following change in your medication:  ? ?1) Resume Torsemide 10 mg: ?- take 1 tablet by mouth once daily as needed for significant ankle swelling ? ?2) Stay on bisoprolol 5 mg: ?- take 0.5 tablet (2.5 mg) by mouth once daily in the morning,  ?- you may take an extra 0.5 tablet (2.5 mg) once daily as needed for flushing episodes ? ?Prostate pill/tamsulosin if needed for urination issues ? ?If you need a refill on your cardiac medications before your next appointment, please call your pharmacy.  ? ? ?Lab work: ?No new labs needed ? ? ?Testing/Procedures: ?No new testing needed ? ? ?Follow-Up: ?At [facility], you and your health needs are our priority.  As part of our continuing mission to provide you with exceptional heart care, we have created designated Provider Care Teams.  These Care Teams include your primary Cardiologist (physician) and Advanced Practice Providers (APPs -  Physician Assistants and Nurse Practitioners) who all work together to provide you with the care you need, when you need it. ? ?You will need a follow up appointment in 6 months ? ?Providers on your designated Care Team:   ?Murray Hodgkins, NP ?Christell Faith, PA-C ?Cadence Kathlen Mody, PA-C ? ?COVID-19 Vaccine Information can be found at: [email or web address] For questions related to vaccine distribution or appointments, please email [email or web address] or call [phone].  ? ?

## 2022-02-12 ENCOUNTER — Ambulatory Visit: Payer: Medicare Other | Admitting: Cardiovascular Disease

## 2022-02-14 ENCOUNTER — Encounter: Payer: Self-pay | Admitting: Cardiovascular Disease

## 2022-02-20 ENCOUNTER — Ambulatory Visit: Payer: Medicare Other | Admitting: Cardiovascular Disease

## 2022-03-12 ENCOUNTER — Ambulatory Visit: Payer: Medicare Other | Admitting: Cardiovascular Disease

## 2022-03-13 ENCOUNTER — Ambulatory Visit: Payer: Medicare Other | Attending: Pulmonary Disease

## 2022-03-13 ENCOUNTER — Ambulatory Visit: Payer: Medicare Other | Admitting: Cardiovascular Disease

## 2022-03-13 VITALS — BP 112/62 | HR 60 | Resp 18 | Ht 69.0 in | Wt 160.0 lb

## 2022-03-13 DIAGNOSIS — R2689 Other abnormalities of gait and mobility: Secondary | ICD-10-CM | POA: Diagnosis present

## 2022-03-13 DIAGNOSIS — M6281 Muscle weakness (generalized): Secondary | ICD-10-CM | POA: Insufficient documentation

## 2022-03-13 DIAGNOSIS — R262 Difficulty in walking, not elsewhere classified: Secondary | ICD-10-CM | POA: Diagnosis present

## 2022-03-13 NOTE — Therapy (Signed)
[place] ?[place], [place], [address] ?Phone: [phone]   Fax:  [phone] ? ?Physical Therapy Evaluation ? ?Patient Details  ?Name: Dylan Weber ?MRN: [id number] ?Date of Birth: Feb 15, 1933 ?Referring Provider (PT): Dr. Ottie Glazier ? ? ?Encounter Date: 03/13/2022 ? ? PT End of Session - 03/14/22 1650   ? ? Visit Number 1   ? Number of Visits 25   ? Date for PT Re-Evaluation 06/05/22   ? Authorization Time Period 03/13/2022-06/05/2022   ? Progress Note Due on Visit 10   ? PT Start Time 1300   ? PT Stop Time 1355   ? PT Time Calculation (min) 55 min   ? Equipment Utilized During Treatment Gait belt   ? Activity Tolerance Patient tolerated treatment well   ? Behavior During Therapy [facility] for tasks assessed/performed   ? ?  ?  ? ?  ? ? ?Past Medical History:  ?Diagnosis Date  ? COPD (chronic obstructive pulmonary disease) ([place])   ? Coronary artery calcification seen on CT scan 11/17/2021  ? COVID-19 10/2021  ? Diastolic dysfunction   ? a. 11/2021 Echo: EF 55-60%, no rwma, GrI DD, nl RV fxn. Mild AI.  ? GERD (gastroesophageal reflux disease)   ? Pulmonary embolism ([place])   ? a. 11/2021 CTA Chest: small peripheral PE in RLL branch of PA-->Eliquis.  ? Right leg DVT ([place])   ? a. 11/2021 LE U/S: occlusive R calf vein DVT in R peroneal vein-->eliquis.  ? SVT (supraventricular tachycardia) ([place])   ? a. Dx 10/2021 in setting of COVID infxn.  ? ? ?Past Surgical History:  ?Procedure Laterality Date  ? APPENDECTOMY    ? CHOLECYSTECTOMY    ? ? ?Vitals:  ? 03/13/22 1311  ?BP: 112/62  ?Pulse: 60  ?Resp: 18  ?SpO2: 97%  ?Weight: 160 lb (72.6 kg)  ?Height: '5\' 9"'$  (1.753 m)  ? ? ? ? Subjective Assessment - 03/13/22 1316   ? ? Subjective Patient reports he is here to work on his strength and endurance. Reports having COVID in December of 2022 and reports has been deconditioned ever since. Reports he has been weaning his O2 and only using 2L Intermittent now.   ? Patient  is accompained by: Family member   spouse  ? Pertinent History 88 year old male with referral for general muscle weakness. He has COPD and recent bout of COVID in Dec 2022 and reports progressive weakness since. He does endorse some ongoing right hip pain with recent injection with no improvement. Patient has 2L O2 as needed but states has not needed lately.   ? How long can you sit comfortably? >60 min   ? How long can you stand comfortably? 5-10 min   ? How long can you walk comfortably? 5-10 min   ? Patient Stated Goals I want to play golf   ? Currently in Pain? Yes   ? Pain Score 4    ? Pain Location Hip   ? Pain Orientation Right   ? Pain Descriptors / Indicators Aching   ? Pain Type Chronic pain   ? Pain Onset More than a month ago   ? Pain Frequency Intermittent   ? Aggravating Factors  prolonged sit, stand, walking   ? Pain Relieving Factors Rest, change position   ? ?  ?  ? ?  ? ?Goals: To play golf again ? ? ?OBJECTIVE ? ?Musculoskeletal ?Tremor: Absent ?Bulk: Normal ?Tone: Normal, no  clonus ? ? ?Posture ?No gross abnormalities noted in standing or seated posture ? ? ?Gait ?No gross abnormalities in gait noted - reciprocal steps but limited functional endurance- O2 sat decreased to 92% in 6 min walk test ? ? ?Strength ?R/L ?3+/3+ Hip flexion ?4/4 Hip external rotation ?4/4 Hip internal rotation ?4/4Hip extension  ?3+/3+Hip abduction ?4/4 Hip adduction ?4+/4+ Knee extension ?5/5 Knee flexion ?5/5 Ankle Plantarflexion ?5/5 Ankle Dorsiflexion ? ? ?NEUROLOGICAL: ? ?Sensation ?Grossly intact to light touch bilateral UEs/LEs as determined by testing dermatomes C2-T2/L2-S2 respectively. Proprioception and hot/cold testing deferred on this date ? ? ? ? ?FUNCTIONAL OUTCOME MEASURES ? ? Results Comments  ?    ?    ?    ?    ?5TSTS 15.12 seconds B UE Support  ?6 Minute Walk Test 970 feet O2 sat down to 92% but back up to 97% with pursed lip breathing.  ?  Below normative values for full community ambulation- (NORM  For age range 22-89 is 1431 feet)   ?    ?    ? ? ?ASSESSMENT ?Clinical Impression: Pt is a pleasant 88 year-old male referred for generalized weakness. PT examination reveals deficits including some proximal bilateral LE strength and decreased functional endurance with some dyspnea with exertion- limiting his ability to be independent in the community. Patient will benefit from skilled PT services to address his overall weakness and improve his functional endurance to assist in enabling him to return to previous level of independence in community.  ? ? ? ?  ? ? ? [facility] PT Assessment - 03/14/22 1640   ? ?  ? Assessment  ? Medical Diagnosis Generalized Weakness   ? Referring Provider (PT) Dr. Ottie Glazier   ? Onset Date/Surgical Date 10/12/21   ? Next MD Visit Unsure   ? Prior Therapy [facility]   ?  ? Precautions  ? Precautions None   ?  ? Restrictions  ? Weight Bearing Restrictions No   ?  ? Balance Screen  ? Has the patient fallen in the past 6 months Yes   ? How many times? 1   ? Has the patient had a decrease in activity level because of a fear of falling?  Yes   ? Is the patient reluctant to leave their home because of a fear of falling?  No   ?  ? Home Environment  ? Living Environment Private residence   ? Living Arrangements Spouse/significant other   ? Available Help at Discharge Family   ? Type of Home House   ? Home Access Level entry   ? Home Layout One level   ? Home Equipment None   ?  ? Prior Function  ? Level of Independence Independent   ? Vocation Retired   ?  ? Cognition  ? Overall Cognitive Status Within Functional Limits for tasks assessed   ? ?  ?  ? ?  ? ? ? ? ? ? ? ? ? ? ? ? ? ?Objective measurements completed on examination: See above findings.  ? ? ? ? ? ? ? ? ? ? ? ? ? ? PT Education - 03/14/22 1644   ? ? Education Details Purpose of PT and Plan of care; Pursed lip breathing   ? Person(s) Educated Patient   ? Methods Explanation;Demonstration;Tactile cues;Verbal cues   ? Comprehension  Verbalized understanding;Returned demonstration;Verbal cues required;Tactile cues required;Need further instruction   ? ?  ?  ? ?  ? ? ?  PT Short Term Goals - 03/14/22 1705   ? ?  ? PT SHORT TERM GOAL #1  ? Title Pt will be independent with initial HEP in order to improve strength and balance in order to decrease fall risk and improve function at home and work   ? Baseline 03/13/2022= Patient presents with no formal HEP in place   ? Time 6   ? Period Weeks   ? Status New   ? Target Date 04/24/22   ?  ? PT SHORT TERM GOAL #2  ? Title Patient will be iindependent with knowledge and performance of pursed lip breathing for imroved recovery of breathing.   ? Baseline 03/13/2022- Patient with limited knowledge of how to recover from shortness of breath.   ? Time 6   ? Period Weeks   ? Status New   ? Target Date 04/24/22   ? ?  ?  ? ?  ? ? ? ? PT Long Term Goals - 03/14/22 1708   ? ?  ? PT LONG TERM GOAL #1  ? Title Pt will be independent with Final HEP in order to improve strength and balance in order to decrease fall risk and improve function at home and work   ? Baseline 03/13/2022= Patient presents with no formal HEP in place   ? Time 12   ? Period Weeks   ? Status New   ? Target Date 06/05/22   ?  ? PT LONG TERM GOAL #2  ? Title Pt will improve FOTO to target score of 66 to display perceived improvements in ability to complete ADL's.   ? Baseline 03/13/2022=57   ? Time 12   ? Period Weeks   ? Status New   ? Target Date 06/05/22   ?  ? PT LONG TERM GOAL #3  ? Title Pt will decrease 5TSTS by at least 2 seconds inlcuding ability to perform with LE only in order to demonstrate clinically significant improvement in LE strength.   ? Baseline 03/13/2022= 15.12 sec with BUE Support   ? Time 12   ? Period Weeks   ? Status New   ? Target Date 06/05/22   ?  ? PT LONG TERM GOAL #4  ? Title Pt will increase 6MWT by at least 59m(1654f- maintaining > 90% O2 sat in order to demonstrate clinically significant improvement in cardiopulmonary  endurance and community ambulation   ? Baseline 03/13/2022= 970 feet using no AD   ? Time 12   ? Period Weeks   ? Status New   ? Target Date 06/05/22   ? ?  ?  ? ?  ? ? ? ? ? ? ? ? ? Plan - 03/14/22 1651   ? ? Clini

## 2022-03-15 ENCOUNTER — Ambulatory Visit: Payer: Medicare Other

## 2022-03-20 ENCOUNTER — Ambulatory Visit: Payer: Medicare Other

## 2022-03-22 ENCOUNTER — Ambulatory Visit: Payer: Medicare Other

## 2022-03-22 DIAGNOSIS — R262 Difficulty in walking, not elsewhere classified: Secondary | ICD-10-CM

## 2022-03-22 DIAGNOSIS — M6281 Muscle weakness (generalized): Secondary | ICD-10-CM

## 2022-03-22 DIAGNOSIS — R2689 Other abnormalities of gait and mobility: Secondary | ICD-10-CM

## 2022-03-22 NOTE — Therapy (Signed)
[place] ?[facility] ?[place], [place], [address] ?Phone: [phone]   Fax:  [phone] ? ?Physical Therapy Treatment ? ?Patient Details  ?Name: Dylan Weber ?MRN: [id number] ?Date of Birth: July 06, 1933 ?Referring Provider (PT): Dr. Ottie Glazier ? ? ?Encounter Date: 03/22/2022 ? ? PT End of Session - 03/22/22 1607   ? ? Visit Number 2   ? Number of Visits 25   ? Date for PT Re-Evaluation 06/05/22   ? Authorization Time Period 03/13/2022-06/05/2022   ? Progress Note Due on Visit 10   ? PT Start Time 1300   ? PT Stop Time 1344   ? PT Time Calculation (min) 44 min   ? Equipment Utilized During Treatment Gait belt   ? Activity Tolerance Patient tolerated treatment well   ? Behavior During Therapy [facility] for tasks assessed/performed   ? ?  ?  ? ?  ? ? ?Past Medical History:  ?Diagnosis Date  ? COPD (chronic obstructive pulmonary disease) ([place])   ? Coronary artery calcification seen on CT scan 11/17/2021  ? COVID-19 10/2021  ? Diastolic dysfunction   ? a. 11/2021 Echo: EF 55-60%, no rwma, GrI DD, nl RV fxn. Mild AI.  ? GERD (gastroesophageal reflux disease)   ? Pulmonary embolism ([place])   ? a. 11/2021 CTA Chest: small peripheral PE in RLL branch of PA-->Eliquis.  ? Right leg DVT ([place])   ? a. 11/2021 LE U/S: occlusive R calf vein DVT in R peroneal vein-->eliquis.  ? SVT (supraventricular tachycardia) ([place])   ? a. Dx 10/2021 in setting of COVID infxn.  ? ? ?Past Surgical History:  ?Procedure Laterality Date  ? APPENDECTOMY    ? CHOLECYSTECTOMY    ? ? ?There were no vitals filed for this visit. ? ? Subjective Assessment - 03/22/22 1608   ? ? Subjective Patient reports feeling well overall today. Denies any significant shortness of breath.   ? Patient is accompained by: Family member   spouse  ? Pertinent History 88 year old male with referral for general muscle weakness. He has COPD and recent bout of COVID in Dec 2022 and reports progressive weakness since. He does  endorse some ongoing right hip pain with recent injection with no improvement. Patient has 2L O2 as needed but states has not needed lately.   ? How long can you sit comfortably? >60 min   ? How long can you stand comfortably? 5-10 min   ? How long can you walk comfortably? 5-10 min   ? Patient Stated Goals I want to play golf   ? Currently in Pain? No/denies   ? Pain Onset More than a month ago   ? ?  ?  ? ?  ? ? ? ?INTERVENTIONS:  ? ? ?Nustep: INTERVAL Training-  ?1 min= L1 ?1 min L2  ?1 min L1 ?1 min L3 ?1 min L1 ?1 min L3 ?O2 sat= 95%: HR=96 ? ?2 min step test= 68 steps  (BUE Support) O2 sat= 93% ? ?Step up with opp LE high knee march - 4Lb AW x 15 reps O2 sat= 88% - quickly back up to 94% with pursed lip breathing.  ? ?Side Step up/over orange hurdle 4lb AW  x 12 reps.  ? ?Forward/backward step- (orange hurdle)  x 12 reps O2 sat= 93% ? ?Sit to stand with hands on knees x 10 reps  ? ?Education provided throughout session via VC/TC and demonstration to facilitate movement at target joints  and correct muscle activation for all testing and exercises performed.  ? ?. ? ? ? ? ? ? ? ? ? ? ? ? ? ? ? ? ? ? ? PT Education - 03/22/22 1706   ? ? Education Details Exercise technique   ? Person(s) Educated Patient   ? Methods Explanation;Demonstration;Tactile cues;Verbal cues   ? Comprehension Verbalized understanding;Returned demonstration;Verbal cues required;Tactile cues required;Need further instruction   ? ?  ?  ? ?  ? ? ? PT Short Term Goals - 03/14/22 1705   ? ?  ? PT SHORT TERM GOAL #1  ? Title Pt will be independent with initial HEP in order to improve strength and balance in order to decrease fall risk and improve function at home and work   ? Baseline 03/13/2022= Patient presents with no formal HEP in place   ? Time 6   ? Period Weeks   ? Status New   ? Target Date 04/24/22   ?  ? PT SHORT TERM GOAL #2  ? Title Patient will be iindependent with knowledge and performance of pursed lip breathing for imroved recovery  of breathing.   ? Baseline 03/13/2022- Patient with limited knowledge of how to recover from shortness of breath.   ? Time 6   ? Period Weeks   ? Status New   ? Target Date 04/24/22   ? ?  ?  ? ?  ? ? ? ? PT Long Term Goals - 03/14/22 1708   ? ?  ? PT LONG TERM GOAL #1  ? Title Pt will be independent with Final HEP in order to improve strength and balance in order to decrease fall risk and improve function at home and work   ? Baseline 03/13/2022= Patient presents with no formal HEP in place   ? Time 12   ? Period Weeks   ? Status New   ? Target Date 06/05/22   ?  ? PT LONG TERM GOAL #2  ? Title Pt will improve FOTO to target score of 66 to display perceived improvements in ability to complete ADL's.   ? Baseline 03/13/2022=57   ? Time 12   ? Period Weeks   ? Status New   ? Target Date 06/05/22   ?  ? PT LONG TERM GOAL #3  ? Title Pt will decrease 5TSTS by at least 2 seconds inlcuding ability to perform with LE only in order to demonstrate clinically significant improvement in LE strength.   ? Baseline 03/13/2022= 15.12 sec with BUE Support   ? Time 12   ? Period Weeks   ? Status New   ? Target Date 06/05/22   ?  ? PT LONG TERM GOAL #4  ? Title Pt will increase 6MWT by at least 91m(1614f- maintaining > 90% O2 sat in order to demonstrate clinically significant improvement in cardiopulmonary endurance and community ambulation   ? Baseline 03/13/2022= 970 feet using no AD   ? Time 12   ? Period Weeks   ? Status New   ? Target Date 06/05/22   ? ?  ?  ? ?  ? ? ? ? ? ? ? ? Plan - 03/22/22 1606   ? ? Clinical Impression Statement Patient presents with excellent motivation for today's 2nd session. He demonstrated fatigue with exercises more than any Shortness of breath.  He was able to demonstrate proper pursed lip breathing. Patient responded well otherwise  to standing endurance based exercises. Patient will  benefit from skilled PT services to address his overall weakness and improve his functional endurance to assist in enabling  him to return to previous level of independence in community   ? Personal Factors and Comorbidities Age;Comorbidity 2   ? Comorbidities COPD; HTN   ? Examination-Activity Limitations Bend;Carry;Lift;Squat;Other;Stand   ? Examination-Participation Restrictions Cleaning;Community Activity;V[employer] Work   ? Stability/Clinical Decision Making Evolving/Moderate complexity   ? Rehab Potential Good   ? PT Frequency 2x / week   ? PT Duration 12 weeks   ? PT Treatment/Interventions ADLs/Self Care Home Management;Canalith Repostioning;Cryotherapy;Electrical Stimulation;Moist Heat;DME Instruction;Gait training;Stair training;Functional mobility training;Therapeutic activities;Therapeutic exercise;Balance training;Neuromuscular re-education;Patient/family education;Manual techniques;Passive range of motion;Dry needling;Vestibular   ? PT Next Visit Plan Review Pursed lip breathing and add hip strengthening, functional endurance based activities.   ? PT Home Exercise Plan Instructed in walking program using RPE scale and use of pulse ox   ? Consulted and Agree with Plan of Care Patient   ? ?  ?  ? ?  ? ? ?Patient will benefit from skilled therapeutic intervention in order to improve the following deficits and impairments:  Cardiopulmonary status limiting activity, Decreased activity tolerance, Decreased endurance, Decreased mobility, Decreased strength, Difficulty walking, Pain ? ?Visit Diagnosis: ?Muscle weakness (generalized) ? ?Difficulty in walking, not elsewhere classified ? ?Other abnormalities of gait and mobility ? ? ? ? ?Problem List ?Patient Active Problem List  ? Diagnosis Date Noted  ? CAD (coronary artery disease) 12/31/2021  ? Near syncope 11/24/2021  ? Pulmonary embolism ([place]) 11/24/2021  ? Elevated troponin 11/24/2021  ? Hypoxia 11/01/2021  ? Acute on chronic respiratory failure with hypoxia (HCC)   ? SVT (supraventricular tachycardia) ([place])   ? COPD exacerbation ([place])   ? Gastroesophageal reflux disease without  esophagitis   ? COVID-19 virus infection 10/31/2021  ? ? ?Lewis Moccasin, PT ?03/22/2022, 5:07 PM ? ?[place] ?[facility] ?[place], [place],

## 2022-03-27 ENCOUNTER — Ambulatory Visit: Payer: Medicare Other

## 2022-03-27 DIAGNOSIS — R262 Difficulty in walking, not elsewhere classified: Secondary | ICD-10-CM

## 2022-03-27 DIAGNOSIS — M6281 Muscle weakness (generalized): Secondary | ICD-10-CM

## 2022-03-27 DIAGNOSIS — R2689 Other abnormalities of gait and mobility: Secondary | ICD-10-CM

## 2022-03-27 NOTE — Therapy (Signed)
[place] ?[facility] ?[place], [place], [address] ?Phone: [phone]   Fax:  [phone] ? ?Physical Therapy Treatment ? ?Patient Details  ?Name: Dylan Weber ?MRN: [id number] ?Date of Birth: [birth year] ?Referring Provider (PT): Dr. Ottie Glazier ? ? ?Encounter Date: 03/27/2022 ? ? PT End of Session - 03/27/22 1312   ? ? Visit Number 3   ? Number of Visits 25   ? Date for PT Re-Evaluation 06/05/22   ? Authorization Time Period 03/13/2022-06/05/2022   ? Progress Note Due on Visit 10   ? PT Start Time 1300   ? PT Stop Time 1342   ? PT Time Calculation (min) 42 min   ? Equipment Utilized During Treatment Gait belt   ? Activity Tolerance Patient tolerated treatment well   ? Behavior During Therapy [facility] for tasks assessed/performed   ? ?  ?  ? ?  ? ? ?Past Medical History:  ?Diagnosis Date  ? COPD (chronic obstructive pulmonary disease) ([place])   ? Coronary artery calcification seen on CT scan 11/17/2021  ? COVID-19 10/2021  ? Diastolic dysfunction   ? a. 11/2021 Echo: EF 55-60%, no rwma, GrI DD, nl RV fxn. Mild AI.  ? GERD (gastroesophageal reflux disease)   ? Pulmonary embolism ([place])   ? a. 11/2021 CTA Chest: small peripheral PE in RLL branch of PA-->Eliquis.  ? Right leg DVT ([place])   ? a. 11/2021 LE U/S: occlusive R calf vein DVT in R peroneal vein-->eliquis.  ? SVT (supraventricular tachycardia) ([place])   ? a. Dx 10/2021 in setting of COVID infxn.  ? ? ?Past Surgical History:  ?Procedure Laterality Date  ? APPENDECTOMY    ? CHOLECYSTECTOMY    ? ? ?There were no vitals filed for this visit. ? ? Subjective Assessment - 03/27/22 1310   ? ? Subjective Patient reports that he was really sore after last visit in his back but pain ceased yesterday. Requested not to try any resistive standing activities today.   ? Patient is accompained by: Family member   spouse  ? Pertinent History 88 year old male with referral for general muscle weakness. He has COPD and recent bout of  COVID in Dec 2022 and reports progressive weakness since. He does endorse some ongoing right hip pain with recent injection with no improvement. Patient has 2L O2 as needed but states has not needed lately.   ? How long can you sit comfortably? >60 min   ? How long can you stand comfortably? 5-10 min   ? How long can you walk comfortably? 5-10 min   ? Patient Stated Goals I want to play golf   ? Currently in Pain? No/denies   ? Pain Onset More than a month ago   ? ?  ?  ? ?  ? ?Interventions:  ? ?Therapeutic Exercises  ? ? ?Nustep L0 - LE only for cardiovascular response= 6 min ? ?Seated LE exercises: ? ?Hip march  ?Hip abd with GTB ?Knee flex with GTB ?Hip add ball squeeze with 3 sec hold ?Heel raises  ?Toe raises ? ?1st set= 10 reps each leg  ?2nd set= 5 reps each leg ? ?Education provided throughout session via VC/TC and demonstration to facilitate movement at target joints and correct muscle activation for all testing and exercises performed.  ? ? ? ? ? ? ? ? ? ? ? ? ? ? ? ? ? ? ? ? ? ? PT Education - 03/27/22  1312   ? ? Education Details Exercise Technique   ? Person(s) Educated Patient   ? Methods Explanation;Demonstration;Tactile cues;Verbal cues   ? Comprehension Verbalized understanding;Verbal cues required;Tactile cues required;Need further instruction   ? ?  ?  ? ?  ? ? ? PT Short Term Goals - 03/14/22 1705   ? ?  ? PT SHORT TERM GOAL #1  ? Title Pt will be independent with initial HEP in order to improve strength and balance in order to decrease fall risk and improve function at home and work   ? Baseline 03/13/2022= Patient presents with no formal HEP in place   ? Time 6   ? Period Weeks   ? Status New   ? Target Date 04/24/22   ?  ? PT SHORT TERM GOAL #2  ? Title Patient will be iindependent with knowledge and performance of pursed lip breathing for imroved recovery of breathing.   ? Baseline 03/13/2022- Patient with limited knowledge of how to recover from shortness of breath.   ? Time 6   ? Period Weeks    ? Status New   ? Target Date 04/24/22   ? ?  ?  ? ?  ? ? ? ? PT Long Term Goals - 03/14/22 1708   ? ?  ? PT LONG TERM GOAL #1  ? Title Pt will be independent with Final HEP in order to improve strength and balance in order to decrease fall risk and improve function at home and work   ? Baseline 03/13/2022= Patient presents with no formal HEP in place   ? Time 12   ? Period Weeks   ? Status New   ? Target Date 06/05/22   ?  ? PT LONG TERM GOAL #2  ? Title Pt will improve FOTO to target score of 66 to display perceived improvements in ability to complete ADL's.   ? Baseline 03/13/2022=57   ? Time 12   ? Period Weeks   ? Status New   ? Target Date 06/05/22   ?  ? PT LONG TERM GOAL #3  ? Title Pt will decrease 5TSTS by at least 2 seconds inlcuding ability to perform with LE only in order to demonstrate clinically significant improvement in LE strength.   ? Baseline 03/13/2022= 15.12 sec with BUE Support   ? Time 12   ? Period Weeks   ? Status New   ? Target Date 06/05/22   ?  ? PT LONG TERM GOAL #4  ? Title Pt will increase 6MWT by at least 33m(1657f- maintaining > 90% O2 sat in order to demonstrate clinically significant improvement in cardiopulmonary endurance and community ambulation   ? Baseline 03/13/2022= 970 feet using no AD   ? Time 12   ? Period Weeks   ? Status New   ? Target Date 06/05/22   ? ?  ?  ? ?  ? ? ? ? ? ? ? ? Plan - 03/27/22 1314   ? ? Clinical Impression Statement Patient presented with good motivation today and denied any low back pain during session. He was sore after last visit so treatment focused on seated LE strengthening and patient performed well today without report of pain or any significant shortness of breath. Patient will benefit from skilled PT services to address his overall weakness and improve his functional endurance to assist in enabling him to return to previous level of independence in community   ? Personal Factors and  Comorbidities Age;Comorbidity 2   ? Comorbidities COPD; HTN   ?  Examination-Activity Limitations Bend;Carry;Lift;Squat;Other;Stand   ? Examination-Participation Restrictions Cleaning;Community Activity;Valla Leaver Work   ? Stability/Clinical Decision Making Evolving/Moderate complexity   ? Rehab Potential Good   ? PT Frequency 2x / week   ? PT Duration 12 weeks   ? PT Treatment/Interventions ADLs/Self Care Home Management;Canalith Repostioning;Cryotherapy;Electrical Stimulation;Moist Heat;DME Instruction;Gait training;Stair training;Functional mobility training;Therapeutic activities;Therapeutic exercise;Balance training;Neuromuscular re-education;Patient/family education;Manual techniques;Passive range of motion;Dry needling;Vestibular   ? PT Next Visit Plan Review Pursed lip breathing and add hip strengthening, functional endurance based activities.   ? PT Home Exercise Plan Instructed in walking program using RPE scale and use of pulse ox   ? Consulted and Agree with Plan of Care Patient   ? ?  ?  ? ?  ? ? ?Patient will benefit from skilled therapeutic intervention in order to improve the following deficits and impairments:  Cardiopulmonary status limiting activity, Decreased activity tolerance, Decreased endurance, Decreased mobility, Decreased strength, Difficulty walking, Pain ? ?Visit Diagnosis: ?Muscle weakness (generalized) ? ?Difficulty in walking, not elsewhere classified ? ?Other abnormalities of gait and mobility ? ? ? ? ?Problem List ?Patient Active Problem List  ? Diagnosis Date Noted  ? CAD (coronary artery disease) 12/31/2021  ? Near syncope 11/24/2021  ? Pulmonary embolism ([place]) 11/24/2021  ? Elevated troponin 11/24/2021  ? Hypoxia 11/01/2021  ? Acute on chronic respiratory failure with hypoxia (HCC)   ? SVT (supraventricular tachycardia) ([place])   ? COPD exacerbation ([place])   ? Gastroesophageal reflux disease without esophagitis   ? COVID-19 virus infection 10/31/2021  ? ? ?Lewis Moccasin, PT ?03/28/2022, 11:44 AM ? ?[place] ?[facility] ?[place], [place], [address] ?Phone: [phone]   Fax:  [phone] ? ?Name: Dreshon Proffit ?MRN: [id number] ?Date of Birth: 1932/12/18 ? ? ? ?

## 2022-03-29 ENCOUNTER — Ambulatory Visit: Payer: Medicare Other

## 2022-03-29 DIAGNOSIS — R2689 Other abnormalities of gait and mobility: Secondary | ICD-10-CM

## 2022-03-29 DIAGNOSIS — M6281 Muscle weakness (generalized): Secondary | ICD-10-CM | POA: Diagnosis not present

## 2022-03-29 DIAGNOSIS — R262 Difficulty in walking, not elsewhere classified: Secondary | ICD-10-CM

## 2022-03-29 NOTE — Therapy (Signed)
[place] [facility] [place], [place], [address] Phone: [phone]   Fax:  [phone]  Physical Therapy Treatment  Patient Details  Name: Dylan Weber MRN: [id number] Date of Birth: Feb 17, 1933 Referring Provider (PT): Dr. Ottie Glazier   Encounter Date: 03/29/2022   PT End of Session - 03/29/22 1300     Visit Number 4    Number of Visits 25    Date for PT Re-Evaluation 06/05/22    Authorization Time Period 03/13/2022-06/05/2022    Progress Note Due on Visit 10    PT Start Time 1301    PT Stop Time 1344    PT Time Calculation (min) 43 min    Equipment Utilized During Treatment Gait belt    Activity Tolerance Patient tolerated treatment well    Behavior During Therapy [facility] for tasks assessed/performed             Past Medical History:  Diagnosis Date   COPD (chronic obstructive pulmonary disease) ([place])    Coronary artery calcification seen on CT scan 11/17/2021   COVID-19 01/5464   Diastolic dysfunction    a. 11/2021 Echo: EF 55-60%, no rwma, GrI DD, nl RV fxn. Mild AI.   GERD (gastroesophageal reflux disease)    Pulmonary embolism ([place])    a. 11/2021 CTA Chest: small peripheral PE in RLL branch of PA-->Eliquis.   Right leg DVT ([place])    a. 11/2021 LE U/S: occlusive R calf vein DVT in R peroneal vein-->eliquis.   SVT (supraventricular tachycardia) ([place])    a. Dx 10/2021 in setting of COVID infxn.    Past Surgical History:  Procedure Laterality Date   APPENDECTOMY     CHOLECYSTECTOMY      There were no vitals filed for this visit.   Subjective Assessment - 03/29/22 1258     Subjective I am feeling better - only had some back pain for a bit earlier today. No pain currently    Patient is accompained by: Family member   spouse   Pertinent History 88 year old male with referral for general muscle weakness. He has COPD and recent bout of COVID in Dec 2022 and reports progressive weakness since. He does  endorse some ongoing right hip pain with recent injection with no improvement. Patient has 2L O2 as needed but states has not needed lately.    How long can you sit comfortably? >60 min    How long can you stand comfortably? 5-10 min    How long can you walk comfortably? 5-10 min    Patient Stated Goals I want to play golf    Currently in Pain? No/denies             INTERVENTIONS:     Seated LE exercises:   Hip march 2lb Alt LE x 10 reps Hip flex/abd up/over pink 1# weight x 15 reps each LE using 2lb AW Knee ext x 10 reps alt LE using 2 lb AW Hip abd with GTB Knee flex with GTB Hip add ball squeeze with 3 sec hold Heel raises  Toe raises Step tap onto 6"  x 2 sets of 30 sec (13 steps and 14 steps Double count)    Education provided throughout session via VC/TC and demonstration to facilitate movement at target joints and correct muscle activation for all testing and exercises performed.  PT Education - 03/29/22 1300     Education Details Exercise technique    Person(s) Educated Patient    Methods Explanation;Demonstration;Tactile cues;Verbal cues    Comprehension Verbalized understanding;Returned demonstration;Verbal cues required;Tactile cues required;Need further instruction              PT Short Term Goals - 03/14/22 1705       PT SHORT TERM GOAL #1   Title Pt will be independent with initial HEP in order to improve strength and balance in order to decrease fall risk and improve function at home and work    Baseline 03/13/2022= Patient presents with no formal HEP in place    Time 6    Period Weeks    Status New    Target Date 04/24/22      PT SHORT TERM GOAL #2   Title Patient will be iindependent with knowledge and performance of pursed lip breathing for imroved recovery of breathing.    Baseline 03/13/2022- Patient with limited knowledge of how to recover from shortness of breath.    Time 6    Period Weeks    Status  New    Target Date 04/24/22               PT Long Term Goals - 03/14/22 1708       PT LONG TERM GOAL #1   Title Pt will be independent with Final HEP in order to improve strength and balance in order to decrease fall risk and improve function at home and work    Baseline 03/13/2022= Patient presents with no formal HEP in place    Time 12    Period Weeks    Status New    Target Date 06/05/22      PT LONG TERM GOAL #2   Title Pt will improve FOTO to target score of 66 to display perceived improvements in ability to complete ADL's.    Baseline 03/13/2022=57    Time 12    Period Weeks    Status New    Target Date 06/05/22      PT LONG TERM GOAL #3   Title Pt will decrease 5TSTS by at least 2 seconds inlcuding ability to perform with LE only in order to demonstrate clinically significant improvement in LE strength.    Baseline 03/13/2022= 15.12 sec with BUE Support    Time 12    Period Weeks    Status New    Target Date 06/05/22      PT LONG TERM GOAL #4   Title Pt will increase 6MWT by at least 9m(1639f- maintaining > 90% O2 sat in order to demonstrate clinically significant improvement in cardiopulmonary endurance and community ambulation    Baseline 03/13/2022= 970 feet using no AD    Time 12    Period Weeks    Status New    Target Date 06/05/22                   Plan - 03/29/22 1303     Clinical Impression Statement Patient presents today with good motivation. He responded well with seated therex and able to increase in resistance and later able to progress back to standing therex without report of increased pain. Patient will benefit from skilled PT services to address his overall weakness and improve his functional endurance to assist in enabling him to return to previous level of independence in community    Personal Factors and Comorbidities Age;Comorbidity 2  Comorbidities COPD; HTN    Examination-Activity Limitations Bend;Carry;Lift;Squat;Other;Stand     Examination-Participation Restrictions Cleaning;Community Activity;Yard Work    [employer] Evolving/Moderate complexity    Rehab Potential Good    PT Frequency 2x / week    PT Duration 12 weeks    PT Treatment/Interventions ADLs/Self Care Home Management;Canalith Repostioning;Cryotherapy;Electrical Stimulation;Moist Heat;DME Instruction;Gait training;Stair training;Functional mobility training;Therapeutic activities;Therapeutic exercise;Balance training;Neuromuscular re-education;Patient/family education;Manual techniques;Passive range of motion;Dry needling;Vestibular    PT Next Visit Plan Review Pursed lip breathing and add hip strengthening, functional endurance based activities.    PT Home Exercise Plan Instructed in walking program using RPE scale and use of pulse ox    Consulted and Agree with Plan of Care Patient             Patient will benefit from skilled therapeutic intervention in order to improve the following deficits and impairments:  Cardiopulmonary status limiting activity, Decreased activity tolerance, Decreased endurance, Decreased mobility, Decreased strength, Difficulty walking, Pain  Visit Diagnosis: Muscle weakness (generalized)  Difficulty in walking, not elsewhere classified  Other abnormalities of gait and mobility     Problem List Patient Active Problem List   Diagnosis Date Noted   CAD (coronary artery disease) 12/31/2021   Near syncope 11/24/2021   Pulmonary embolism ([place]) 11/24/2021   Elevated troponin 11/24/2021   Hypoxia 11/01/2021   Acute on chronic respiratory failure with hypoxia (HCC)    SVT (supraventricular tachycardia) (HCC)    COPD exacerbation (HCC)    Gastroesophageal reflux disease without esophagitis    COVID-19 virus infection 10/31/2021    Lewis Moccasin, PT 03/29/2022, 10:51 PM  [place] [facility] [address] [place], [place], [address] Phone:  [phone]   Fax:  [phone]  Name: Dylan Weber MRN: [id number] Date of Birth: 1933/10/07

## 2022-04-03 ENCOUNTER — Ambulatory Visit: Payer: Medicare Other

## 2022-04-03 DIAGNOSIS — M6281 Muscle weakness (generalized): Secondary | ICD-10-CM | POA: Diagnosis not present

## 2022-04-03 DIAGNOSIS — R262 Difficulty in walking, not elsewhere classified: Secondary | ICD-10-CM

## 2022-04-03 DIAGNOSIS — R2689 Other abnormalities of gait and mobility: Secondary | ICD-10-CM

## 2022-04-03 NOTE — Therapy (Signed)
[place] MAIN [facility] SERVICES [place], [place], [address] Phone: [phone]   Fax:  [phone]  Physical Therapy Treatment  Patient Details  Name: Dylan Weber MRN: [id number] Date of Birth: [birth year] Referring Provider (PT): Dr. Ottie Glazier   Encounter Date: 04/03/2022   PT End of Session - 04/03/22 1407     Visit Number 5    Number of Visits 25    Date for PT Re-Evaluation 06/05/22    Authorization Time Period 03/13/2022-06/05/2022    Progress Note Due on Visit 10    PT Start Time 1300    PT Stop Time 1345    PT Time Calculation (min) 45 min    Equipment Utilized During Treatment Gait belt    Activity Tolerance Patient tolerated treatment well    Behavior During Therapy [facility] for tasks assessed/performed             Past Medical History:  Diagnosis Date   COPD (chronic obstructive pulmonary disease) ([place])    Coronary artery calcification seen on CT scan 11/17/2021   COVID-19 50/5397   Diastolic dysfunction    a. 11/2021 Echo: EF 55-60%, no rwma, GrI DD, nl RV fxn. Mild AI.   GERD (gastroesophageal reflux disease)    Pulmonary embolism ([place])    a. 11/2021 CTA Chest: small peripheral PE in RLL branch of PA-->Eliquis.   Right leg DVT ([place])    a. 11/2021 LE U/S: occlusive R calf vein DVT in R peroneal vein-->eliquis.   SVT (supraventricular tachycardia) ([place])    a. Dx 10/2021 in setting of COVID infxn.    Past Surgical History:  Procedure Laterality Date   APPENDECTOMY     CHOLECYSTECTOMY      There were no vitals filed for this visit.   Subjective Assessment - 04/03/22 1409     Subjective Patient reports doing well overall - states moving around better.    Patient is accompained by: Family member   spouse   Pertinent History 88 year old male with referral for general muscle weakness. He has COPD and recent bout of COVID in Dec 2022 and reports progressive weakness since. He does endorse some ongoing right  hip pain with recent injection with no improvement. Patient has 2L O2 as needed but states has not needed lately.    How long can you sit comfortably? >60 min    How long can you stand comfortably? 5-10 min    How long can you walk comfortably? 5-10 min    Patient Stated Goals I want to play golf    Currently in Pain? No/denies              INTERVENTIONS:  BP= 152/92 mmHg Right UE in sitting  2 min step test= 66 steps on right   O2 sat= 92 %; HR=83 bpm with some reported SOB   step up with min BUE support x 15 reps each LE  Side Step over orange hurdle x 15 reps  Lateral step up/down onto 6" step block x 15 reps each  Tandem standing - multiple attempts each leg - up to 30 sec each side  Single leg standing- hold 2-4 sec at best- multiple attempts each side.     Static stand (one foot on airex pad and front on 6" step block) - attempted trunk twist with open arms then clap- Too difficulty- increased unsteadiness and LOB  143/75 mmHg during exercises- right arm   Education provided throughout session  via VC/TC and demonstration to facilitate movement at target joints and correct muscle activation for all testing and exercises performed.     Access Code: [email or web address] URL: [email or web address] Date: 04/03/2022 Prepared by: Sande Brothers  Exercises - Tandem Stance  - 3 x weekly - 3 sets - up to 30 hold - Single Leg Stance  - 3 x weekly - 3 sets - 20-30 sec hold               PT Education - 04/03/22 1410     Education Details Exercise technique    Person(s) Educated Patient    Methods Explanation;Tactile cues;Demonstration;Verbal cues    Comprehension Verbalized understanding;Returned demonstration;Verbal cues required;Tactile cues required;Need further instruction              PT Short Term Goals - 03/14/22 1705       PT SHORT TERM GOAL #1   Title Pt will be independent with initial HEP in order to improve strength and  balance in order to decrease fall risk and improve function at home and work    Baseline 03/13/2022= Patient presents with no formal HEP in place    Time 6    Period Weeks    Status New    Target Date 04/24/22      PT SHORT TERM GOAL #2   Title Patient will be iindependent with knowledge and performance of pursed lip breathing for imroved recovery of breathing.    Baseline 03/13/2022- Patient with limited knowledge of how to recover from shortness of breath.    Time 6    Period Weeks    Status New    Target Date 04/24/22               PT Long Term Goals - 03/14/22 1708       PT LONG TERM GOAL #1   Title Pt will be independent with Final HEP in order to improve strength and balance in order to decrease fall risk and improve function at home and work    Baseline 03/13/2022= Patient presents with no formal HEP in place    Time 12    Period Weeks    Status New    Target Date 06/05/22      PT LONG TERM GOAL #2   Title Pt will improve FOTO to target score of 66 to display perceived improvements in ability to complete ADL's.    Baseline 03/13/2022=57    Time 12    Period Weeks    Status New    Target Date 06/05/22      PT LONG TERM GOAL #3   Title Pt will decrease 5TSTS by at least 2 seconds inlcuding ability to perform with LE only in order to demonstrate clinically significant improvement in LE strength.    Baseline 03/13/2022= 15.12 sec with BUE Support    Time 12    Period Weeks    Status New    Target Date 06/05/22      PT LONG TERM GOAL #4   Title Pt will increase 6MWT by at least 100m(1640f- maintaining > 90% O2 sat in order to demonstrate clinically significant improvement in cardiopulmonary endurance and community ambulation    Baseline 03/13/2022= 970 feet using no AD    Time 12    Period Weeks    Status New    Target Date 06/05/22                   Plan -  04/03/22 1407     Clinical Impression Statement Patient presents with good motivation and no  shortness of breath at rest. His BP was high initially but improved with exercise. He responded well to [facility] for pursed lip breathing- able to recover quickly from 92-93%- up to 97%. He was able to progress well with standing endurance activities and balance exercises - challenged mostly with single leg stance and issued updated HEP to focus on this activity. ??Patient will benefit from skilled PT services to address his overall weakness and improve his functional endurance to assist in enabling him to return to previous level of independence in community    Personal Factors and Comorbidities Age;Comorbidity 2    Comorbidities COPD; HTN    Examination-Activity Limitations Bend;Carry;Lift;Squat;Other;Stand    Examination-Participation Restrictions Cleaning;Community Activity;Yard Work    [employer] officer Evolving/Moderate complexity    Rehab Potential Good    PT Frequency 2x / week    PT Duration 12 weeks    PT Treatment/Interventions ADLs/Self Care Home Management;Canalith Repostioning;Cryotherapy;Electrical Stimulation;Moist Heat;DME Instruction;Gait training;Stair training;Functional mobility training;Therapeutic activities;Therapeutic exercise;Balance training;Neuromuscular re-education;Patient/family education;Manual techniques;Passive range of motion;Dry needling;Vestibular    PT Next Visit Plan Review Pursed lip breathing and add hip strengthening, functional endurance based activities.    PT Home Exercise Plan Instructed in walking program using RPE scale and use of pulse ox    Consulted and Agree with Plan of Care Patient             Patient will benefit from skilled therapeutic intervention in order to improve the following deficits and impairments:  Cardiopulmonary status limiting activity, Decreased activity tolerance, Decreased endurance, Decreased mobility, Decreased strength, Difficulty walking, Pain  Visit Diagnosis: Muscle weakness (generalized)  Difficulty in  walking, not elsewhere classified  Other abnormalities of gait and mobility     Problem List Patient Active Problem List   Diagnosis Date Noted   CAD (coronary artery disease) 12/31/2021   Near syncope 11/24/2021   Pulmonary embolism ([place]) 11/24/2021   Elevated troponin 11/24/2021   Hypoxia 11/01/2021   Acute on chronic respiratory failure with hypoxia (HCC)    SVT (supraventricular tachycardia) (HCC)    COPD exacerbation (H[facility])    Gastroesophageal reflux disease without esophagitis    COVID-19 virus infection 10/31/2021    Lewis Moccasin, PT 04/03/2022, 2:10 PM  [place] [address] [place], [place], [address] Phone: [phone]   Fax:  [phone]  Name: Mete Purdum MRN: [id number] Date of Birth: Apr 09, 1933

## 2022-04-05 ENCOUNTER — Ambulatory Visit: Payer: Medicare Other

## 2022-04-05 DIAGNOSIS — M6281 Muscle weakness (generalized): Secondary | ICD-10-CM

## 2022-04-05 DIAGNOSIS — R262 Difficulty in walking, not elsewhere classified: Secondary | ICD-10-CM

## 2022-04-05 DIAGNOSIS — R2689 Other abnormalities of gait and mobility: Secondary | ICD-10-CM

## 2022-04-05 NOTE — Therapy (Signed)
[place] MAIN [facility] SERVICES [place], [place], [address] Phone: [phone]   Fax:  [phone]  Physical Therapy Treatment  Patient Details  Name: Dylan Weber MRN: [id number] Date of Birth: Apr 12, 1933 Referring Provider (PT): Dr. Ottie Glazier   Encounter Date: 04/05/2022   PT End of Session - 04/05/22 1557     Visit Number 6    Number of Visits 25    Date for PT Re-Evaluation 06/05/22    Authorization Time Period 03/13/2022-06/05/2022    Progress Note Due on Visit 10    PT Start Time 1300    PT Stop Time 1344    PT Time Calculation (min) 44 min    Equipment Utilized During Treatment Gait belt    Activity Tolerance Patient tolerated treatment well    Behavior During Therapy [facility] for tasks assessed/performed             Past Medical History:  Diagnosis Date   COPD (chronic obstructive pulmonary disease) ([place])    Coronary artery calcification seen on CT scan 11/17/2021   COVID-19 16/0737   Diastolic dysfunction    a. 11/2021 Echo: EF 55-60%, no rwma, GrI DD, nl RV fxn. Mild AI.   GERD (gastroesophageal reflux disease)    Pulmonary embolism ([place])    a. 11/2021 CTA Chest: small peripheral PE in RLL branch of PA-->Eliquis.   Right leg DVT ([place])    a. 11/2021 LE U/S: occlusive R calf vein DVT in R peroneal vein-->eliquis.   SVT (supraventricular tachycardia) ([place])    a. Dx 10/2021 in setting of COVID infxn.    Past Surgical History:  Procedure Laterality Date   APPENDECTOMY     CHOLECYSTECTOMY      There were no vitals filed for this visit.   Subjective Assessment - 04/05/22 1310     Subjective Patient reports he tried some balance activities and struggled. He continued to deny any shortness of breath or pain.    Patient is accompained by: Family member   spouse   Pertinent History 88 year old male with referral for general muscle weakness. He has COPD and recent bout of COVID in Dec 2022 and reports progressive  weakness since. He does endorse some ongoing right hip pain with recent injection with no improvement. Patient has 2L O2 as needed but states has not needed lately.    How long can you sit comfortably? >60 min    How long can you stand comfortably? 5-10 min    How long can you walk comfortably? 5-10 min    Patient Stated Goals I want to play golf    Currently in Pain? No/denies             INTERVENTIONS:   Nustep Interval training: L0 x 2 min L2 x 2 min L0 x 2 min L2 x 2 min  LE only O2 Sat=98% HR=72 bpm  Step up onto 6" block with blue airex pad x 10 reps BLE  Step up onto airex pad without UE support  Standing mini lunge squats x 12 reps each LE   Tandem gait in // bars without UE support down and back x 6 - VC to go slow. 02 sat=97%  BP= 124/60mHg Right UE in seated position  Side stepping up and over 3 (1/2 foam rolls position in // bars) without UE support x 8 down and back.   Calf raises x 15 reps on 1/2 foam Toe raises x 1 5  reps on 1/2 foam (patient rates as easy)   Forward stepping up and over 3 (1/2 foam rolls position in // bars) without UE support x 8 down and back  Education provided throughout session via VC/TC and demonstration to facilitate movement at target joints and correct muscle activation for all testing and exercises performed.                          PT Education - 04/05/22 1557     Education Details Exercise technique    Person(s) Educated Patient    Methods Explanation;Demonstration;Tactile cues;Verbal cues    Comprehension Verbalized understanding;Returned demonstration;Tactile cues required;Verbal cues required;Need further instruction              PT Short Term Goals - 03/14/22 1705       PT SHORT TERM GOAL #1   Title Pt will be independent with initial HEP in order to improve strength and balance in order to decrease fall risk and improve function at home and work    Baseline 03/13/2022= Patient presents  with no formal HEP in place    Time 6    Period Weeks    Status New    Target Date 04/24/22      PT SHORT TERM GOAL #2   Title Patient will be iindependent with knowledge and performance of pursed lip breathing for imroved recovery of breathing.    Baseline 03/13/2022- Patient with limited knowledge of how to recover from shortness of breath.    Time 6    Period Weeks    Status New    Target Date 04/24/22               PT Long Term Goals - 03/14/22 1708       PT LONG TERM GOAL #1   Title Pt will be independent with Final HEP in order to improve strength and balance in order to decrease fall risk and improve function at home and work    Baseline 03/13/2022= Patient presents with no formal HEP in place    Time 12    Period Weeks    Status New    Target Date 06/05/22      PT LONG TERM GOAL #2   Title Pt will improve FOTO to target score of 66 to display perceived improvements in ability to complete ADL's.    Baseline 03/13/2022=57    Time 12    Period Weeks    Status New    Target Date 06/05/22      PT LONG TERM GOAL #3   Title Pt will decrease 5TSTS by at least 2 seconds inlcuding ability to perform with LE only in order to demonstrate clinically significant improvement in LE strength.    Baseline 03/13/2022= 15.12 sec with BUE Support    Time 12    Period Weeks    Status New    Target Date 06/05/22      PT LONG TERM GOAL #4   Title Pt will increase 6MWT by at least 62m(1676f- maintaining > 90% O2 sat in order to demonstrate clinically significant improvement in cardiopulmonary endurance and community ambulation    Baseline 03/13/2022= 970 feet using no AD    Time 12    Period Weeks    Status New    Target Date 06/05/22                   Plan - 04/05/22 1557  Clinical Impression Statement Patient performed well overall with improving functional strength and endurance while keeping O2 sat > 95%. He presents with some instability with balance - VC to slow  down and focus on his balance. He was fatigued with exercises yet able to perform well without difficulty or pain. He required only minimal standing/seated rest break and increasing reps/time on his feet today. Patient will benefit from skilled PT services to address his overall weakness and improve his functional endurance to assist in enabling him to return to previous level of independence in community    Personal Factors and Comorbidities Age;Comorbidity 2    Comorbidities COPD; HTN    Examination-Activity Limitations Bend;Carry;Lift;Squat;Other;Stand    Examination-Participation Restrictions Cleaning;Community Activity;Yard Work    [employer] officer Evolving/Moderate complexity    Rehab Potential Good    PT Frequency 2x / week    PT Duration 12 weeks    PT Treatment/Interventions ADLs/Self Care Home Management;Canalith Repostioning;Cryotherapy;Electrical Stimulation;Moist Heat;DME Instruction;Gait training;Stair training;Functional mobility training;Therapeutic activities;Therapeutic exercise;Balance training;Neuromuscular re-education;Patient/family education;Manual techniques;Passive range of motion;Dry needling;Vestibular    PT Next Visit Plan Review Pursed lip breathing and add hip strengthening, functional endurance based activities.    PT Home Exercise Plan Instructed in walking program using RPE scale and use of pulse ox    Consulted and Agree with Plan of Care Patient             Patient will benefit from skilled therapeutic intervention in order to improve the following deficits and impairments:  Cardiopulmonary status limiting activity, Decreased activity tolerance, Decreased endurance, Decreased mobility, Decreased strength, Difficulty walking, Pain  Visit Diagnosis: Other abnormalities of gait and mobility  Muscle weakness (generalized)  Difficulty in walking, not elsewhere classified     Problem List Patient Active Problem List   Diagnosis Date  Noted   CAD (coronary artery disease) 12/31/2021   Near syncope 11/24/2021   Pulmonary embolism ([place]) 11/24/2021   Elevated troponin 11/24/2021   Hypoxia 11/01/2021   Acute on chronic respiratory failure with hypoxia (HCC)    SVT (supraventricular tachycardia) ([facility])    COPD exacerbation ([facility]CC)    Gastroesophageal reflux disease without esophagitis    COVID-19 virus infection 10/31/2021    Lewis Moccasin, PT 04/05/2022, 4:01 PM  [place] MAIN [facility] SERVICES [address] [place], [place], [address] Phone: [phone]   Fax:  [phone]  Name: Dylan Weber MRN: [id number] Date of Birth: 18-May-1933

## 2022-04-10 ENCOUNTER — Ambulatory Visit: Payer: Medicare Other

## 2022-04-10 DIAGNOSIS — M6281 Muscle weakness (generalized): Secondary | ICD-10-CM | POA: Diagnosis not present

## 2022-04-10 DIAGNOSIS — R262 Difficulty in walking, not elsewhere classified: Secondary | ICD-10-CM

## 2022-04-10 DIAGNOSIS — R2689 Other abnormalities of gait and mobility: Secondary | ICD-10-CM

## 2022-04-10 NOTE — Therapy (Unsigned)
[place] MAIN [facility] SERVICES [place], [place], [address] Phone: [phone]   Fax:  [phone]  Physical Therapy Treatment  Patient Details  Name: Dylan Weber MRN: [id number] Date of Birth: [birth year] Referring Provider (PT): Dr. Ottie Glazier   Encounter Date: 04/10/2022   PT End of Session - 04/10/22 1255     Visit Number 7    Number of Visits 25    Date for PT Re-Evaluation 06/05/22    Authorization Time Period 03/13/2022-06/05/2022    Progress Note Due on Visit 10    PT Start Time 1256    PT Stop Time 1344    PT Time Calculation (min) 48 min    Equipment Utilized During Treatment Gait belt    Activity Tolerance Patient tolerated treatment well    Behavior During Therapy [facility] for tasks assessed/performed             Past Medical History:  Diagnosis Date   COPD (chronic obstructive pulmonary disease) ([place])    Coronary artery calcification seen on CT scan 11/17/2021   COVID-19 41/7408   Diastolic dysfunction    a. 11/2021 Echo: EF 55-60%, no rwma, GrI DD, nl RV fxn. Mild AI.   GERD (gastroesophageal reflux disease)    Pulmonary embolism ([place])    a. 11/2021 CTA Chest: small peripheral PE in RLL branch of PA-->Eliquis.   Right leg DVT ([place])    a. 11/2021 LE U/S: occlusive R calf vein DVT in R peroneal vein-->eliquis.   SVT (supraventricular tachycardia) ([place])    a. Dx 10/2021 in setting of COVID infxn.    Past Surgical History:  Procedure Laterality Date   APPENDECTOMY     CHOLECYSTECTOMY      There were no vitals filed for this visit.   Subjective Assessment - 04/10/22 1255     Subjective Patient reports he is doing better- been practicing that 1 leg in front of the other and added some resistance to stationary bike at home    Patient is accompained by: Family member   spouse   Pertinent History 88 year old male with referral for general muscle weakness. He has COPD and recent bout of COVID in Dec 2022 and  reports progressive weakness since. He does endorse some ongoing right hip pain with recent injection with no improvement. Patient has 2L O2 as needed but states has not needed lately.    How long can you sit comfortably? >60 min    How long can you stand comfortably? 5-10 min    How long can you walk comfortably? 5-10 min    Patient Stated Goals I want to play golf    Currently in Pain? No/denies                  INTERVENTIONS:    Nustep Interval training: L0 x 1 min L3 x 2 min L1 x 1 min L4 x 2 min  LE only O2 Sat=94% HR=71 bpm   Step up onto 6" block with blue airex pad on top x 20 reps BLE  (10 with BUE support and 10 without)    Staggered stand (back leg on airex pad and front on 6"step) - 30 sec hold each LE- progressed to horizontal head turns (10 reps in each position) and 10 reps each with vertical Head motions (in each position)   Sit to stand- attempted to not use his UEs- Very difficult - 6 reps only Standing mini lunge squats x  12 reps each LE  Standing forward lunge x 5 reps BLE  Standing Reverse lunge x 5 reps BLE Standing side lunge x 5 reps BLE    Tandem gait in // bars without UE support down and back x 6 - VC to go slow. 02 sat=89% with patient reporting mild SOB- quickly back up to 97% within 1 min of pursed lip breathing.         Calf raises x 15 reps at support bar Toe raises x 1 5 reps at support bar  Side step in // bars (in crouched - minisquat position) - down and back x 3 trials.  O2 sat= 89% but quickly back up to 95% with PLB.    Seated hip flex/abd up and over orange hurdle x 10 reps each LE- Patient reports as very challenging.  Single leg stance=  Instructed to try to hold 5-10 sec. Patient performed multiple attempts- at best able to hold 4 sec each LE.       Education provided throughout session via VC/TC and demonstration to facilitate movement at target joints and correct muscle activation for all testing and exercises  performed.                      PT Education - 04/10/22 1255     Education Details Exercise technique    Person(s) Educated Patient    Methods Explanation;Demonstration;Tactile cues;Verbal cues    Comprehension Verbalized understanding;Returned demonstration;Verbal cues required;Tactile cues required;Need further instruction              PT Short Term Goals - 03/14/22 1705       PT SHORT TERM GOAL #1   Title Pt will be independent with initial HEP in order to improve strength and balance in order to decrease fall risk and improve function at home and work    Baseline 03/13/2022= Patient presents with no formal HEP in place    Time 6    Period Weeks    Status New    Target Date 04/24/22      PT SHORT TERM GOAL #2   Title Patient will be iindependent with knowledge and performance of pursed lip breathing for imroved recovery of breathing.    Baseline 03/13/2022- Patient with limited knowledge of how to recover from shortness of breath.    Time 6    Period Weeks    Status New    Target Date 04/24/22               PT Long Term Goals - 03/14/22 1708       PT LONG TERM GOAL #1   Title Pt will be independent with Final HEP in order to improve strength and balance in order to decrease fall risk and improve function at home and work    Baseline 03/13/2022= Patient presents with no formal HEP in place    Time 12    Period Weeks    Status New    Target Date 06/05/22      PT LONG TERM GOAL #2   Title Pt will improve FOTO to target score of 66 to display perceived improvements in ability to complete ADL's.    Baseline 03/13/2022=57    Time 12    Period Weeks    Status New    Target Date 06/05/22      PT LONG TERM GOAL #3   Title Pt will decrease 5TSTS by at least 2 seconds inlcuding ability to  perform with LE only in order to demonstrate clinically significant improvement in LE strength.    Baseline 03/13/2022= 15.12 sec with BUE Support    Time 12    Period  Weeks    Status New    Target Date 06/05/22      PT LONG TERM GOAL #4   Title Pt will increase 6MWT by at least 80m(1689f- maintaining > 90% O2 sat in order to demonstrate clinically significant improvement in cardiopulmonary endurance and community ambulation    Baseline 03/13/2022= 970 feet using no AD    Time 12    Period Weeks    Status New    Target Date 06/05/22                   Plan - 04/10/22 1256     Clinical Impression Statement Patient did present with some desaturation with exertion today- more than previous visits yet continues to bounce back quickly with pursed lip breathing. He demonstrated progress overall with balance with much improved tandem standing and gait. He continues to lack adequate LE strength to complete a consistent sit to stand. 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    PT Home Exercise Plan Instructed in walking program using RPE scale  and use of pulse ox    Consulted and Agree with Plan of Care Patient             Patient will benefit from skilled therapeutic intervention in order to improve the following deficits and impairments:  Cardiopulmonary status limiting activity, Decreased activity tolerance, Decreased endurance, Decreased mobility, Decreased strength, Difficulty walking, Pain  Visit Diagnosis: Other abnormalities of gait and mobility  Muscle weakness (generalized)  Difficulty in walking, not elsewhere classified     Problem List Patient Active Problem List   Diagnosis Date Noted   CAD (coronary artery disease) 12/31/2021   Near syncope 11/24/2021   Pulmonary embolism ([place]/13/2023   Elevated troponin 11/24/2021   Hypoxia 11/01/2021   Acute on chronic respiratory failure with hypoxia (HCC)    SVT (supraventricular tachycardia) (HCC)    COPD exacerbation (HCC)    Gastroesophageal reflux disease without esophagitis    COVID-19 virus infection 10/31/2021    JeLewis MoccasinPT 04/11/2022, 1:41 PM  Co[facility]tt[facility]AIN REE[facility] CenterERVICES [address]dEmaj[place]NC[place]hone: [phone] Fax:  [phone]: Dylan GrabelRN: [id number] of Birth: [birth year]

## 2022-04-12 ENCOUNTER — Ambulatory Visit: Payer: Medicare Other | Attending: Pulmonary Disease | Admitting: Physical Therapy

## 2022-04-12 ENCOUNTER — Encounter: Payer: Self-pay | Admitting: Physical Therapy

## 2022-04-12 DIAGNOSIS — R262 Difficulty in walking, not elsewhere classified: Secondary | ICD-10-CM | POA: Insufficient documentation

## 2022-04-12 DIAGNOSIS — M6281 Muscle weakness (generalized): Secondary | ICD-10-CM | POA: Diagnosis present

## 2022-04-12 DIAGNOSIS — R2689 Other abnormalities of gait and mobility: Secondary | ICD-10-CM | POA: Diagnosis present

## 2022-04-12 NOTE — Therapy (Signed)
[place] MAIN [facility] SERVICES [address] [place], [place], [address] Phone: [phone]   Fax:  [phone]  Physical Therapy Treatment  Patient Details  Name: Dylan Weber MRN: [id number] Date of Birth: [birth year] Referring Provider (PT): Dr. Ottie Glazier   Encounter Date: 04/12/2022   PT End of Session - 04/12/22 1306     Visit Number 8    Number of Visits 25    Date for PT Re-Evaluation 06/05/22    Authorization Time Period 03/13/2022-06/05/2022    Progress Note Due on Visit 10    PT Start Time 1303    PT Stop Time 1345    PT Time Calculation (min) 42 min    Equipment Utilized During Treatment Gait belt    Activity Tolerance Patient tolerated treatment well    Behavior During Therapy [facility] for tasks assessed/performed             Past Medical History:  Diagnosis Date   COPD (chronic obstructive pulmonary disease) ([place])    Coronary artery calcification seen on CT scan 11/17/2021   COVID-19 42/3536   Diastolic dysfunction    a. 11/2021 Echo: EF 55-60%, no rwma, GrI DD, nl RV fxn. Mild AI.   GERD (gastroesophageal reflux disease)    Pulmonary embolism ([place])    a. 11/2021 CTA Chest: small peripheral PE in RLL branch of PA-->Eliquis.   Right leg DVT ([place])    a. 11/2021 LE U/S: occlusive R calf vein DVT in R peroneal vein-->eliquis.   SVT (supraventricular tachycardia) ([place])    a. Dx 10/2021 in setting of COVID infxn.    Past Surgical History:  Procedure Laterality Date   APPENDECTOMY     CHOLECYSTECTOMY      There were no vitals filed for this visit.   Subjective Assessment - 04/12/22 1305     Subjective Pt reports one fall leaving a grocery on tuesday. He reports he thinks it was due to his footwear eing different than normal.    Patient is accompained by: Family member   spouse   Pertinent History 88 year old male with referral for general muscle weakness. He has COPD and recent bout of COVID in Dec 2022 and reports  progressive weakness since. He does endorse some ongoing right hip pain with recent injection with no improvement. Patient has 2L O2 as needed but states has not needed lately.    How long can you sit comfortably? >60 min    How long can you stand comfortably? 5-10 min    How long can you walk comfortably? 5-10 min    Patient Stated Goals I want to play golf    Currently in Pain? No/denies                INTERVENTIONS:    Nustep progression LE only Level 3,4,5 x 2 min ea.  Pt rates as difficult  Step up onto 6" block with blue airex pad on top x 20 reps BLE  x 10 ea LE  -Cues for full erect posture upon standing, however, with cues patient still unable to perform with good posture and good glutes muscle activation.  Patient also unable to complete this without upper extremities this session. -X10 with stepping up to Airex pad without 6 inch block performing x10 on each lower extremity.  With cues patient better able to maintain full erect posture upon standing.   Staggered stand (back leg on airex pad and front on 6"step) - 30 sec  hold each LE  Sit to stand- attempted to not use his UEs- medium x 5 , x 10 second round with cues for proper hip muscle recruitment  Standing forward lunge x 5 reps BLE  Standing Reverse lunge x 5 reps BLE Standing side lunge x 5 reps BLE   Mini squat in parallel bars with cues for slow and controlled movement 2 sets of 10 repetitions   Calf raises x 15 reps at support bar Toe raises x 15 reps at support bar   Pt required occasional rest breaks due fatigue, PT was quick to ask when pt appeared to be fatiguing in order to prevent excessive fatigue.  Oxygen monitored throughout session.  Occasionally oxygen levels dropped to 90% measured with finger pulse oximeter, however, patient was not displaying signs and symptoms of dyspnea and when measured with his wrist oxygen saturation device his levels were always in the upper 90s.  Patient educated on  asking his doctor a potential for Raynaud's disease as this can have some effect on oxygen saturation taken at the fingertips but also with patient's wearable technology may not be adequate for measuring oxygen saturation compared to a finger oxygen saturation monitor.   Pt educated throughout session about proper posture and technique with exercises. Improved exercise technique, movement at target joints, use of target muscles after min to mod verbal, visual, tactile cues. Rationale for Evaluation and Treatment Rehabilitation                              PT Education - 04/12/22 1306     Education Details exercise technique    Person(s) Educated Patient    Methods Explanation;Demonstration;Tactile cues    Comprehension Verbalized understanding;Returned demonstration;Verbal cues required              PT Short Term Goals - 03/14/22 1705       PT SHORT TERM GOAL #1   Title Pt will be independent with initial HEP in order to improve strength and balance in order to decrease fall risk and improve function at home and work    Baseline 03/13/2022= Patient presents with no formal HEP in place    Time 6    Period Weeks    Status New    Target Date 04/24/22      PT SHORT TERM GOAL #2   Title Patient will be iindependent with knowledge and performance of pursed lip breathing for imroved recovery of breathing.    Baseline 03/13/2022- Patient with limited knowledge of how to recover from shortness of breath.    Time 6    Period Weeks    Status New    Target Date 04/24/22               PT Long Term Goals - 03/14/22 1708       PT LONG TERM GOAL #1   Title Pt will be independent with Final HEP in order to improve strength and balance in order to decrease fall risk and improve function at home and work    Baseline 03/13/2022= Patient presents with no formal HEP in place    Time 12    Period Weeks    Status New    Target Date 06/05/22      PT LONG TERM GOAL  #2   Title Pt will improve FOTO to target score of 66 to display perceived improvements in ability to complete ADL's.  Baseline 03/13/2022=57    Time 12    Period Weeks    Status New    Target Date 06/05/22      PT LONG TERM GOAL #3   Title Pt will decrease 5TSTS by at least 2 seconds inlcuding ability to perform with LE only in order to demonstrate clinically significant improvement in LE strength.    Baseline 03/13/2022= 15.12 sec with BUE Support    Time 12    Period Weeks    Status New    Target Date 06/05/22      PT LONG TERM GOAL #4   Title Pt will increase 6MWT by at least 39m(1684f- maintaining > 90% O2 sat in order to demonstrate clinically significant improvement in cardiopulmonary endurance and community ambulation    Baseline 03/13/2022= 970 feet using no AD    Time 12    Period Weeks    Status New    Target Date 06/05/22                   Plan - 04/12/22 1308     Clinical Impression Statement Patient presents with good motivation for completion of physical therapy activities.  Patient reports visit with MD he did not go well and he may require oxygen but is still waiting to hear back about this.  Patient continued with lower extremity strengthening and endurance training with good overall response.  Patient did have some oxygen desaturation when measured with finger pulse ox would not have any significant signs and symptoms of desaturation at this time.  We will continue to monitor this in future sessions.  Patient will continue to benefit from skilled physical therapy interventions in order to improve endurance, balance, in order to help him return to previous level of independence with community activities.    Personal Factors and Comorbidities Age;Comorbidity 2    Comorbidities COPD; HTN    Examination-Activity Limitations Bend;Carry;Lift;Squat;Other;Stand    Examination-Participation Restrictions Cleaning;Community Activity;Yard Work    StCopyvolving/Moderate complexity    Rehab Potential Good    PT Frequency 2x / week    PT Duration 12 weeks    PT Treatment/Interventions ADLs/Self Care Home Management;Canalith Repostioning;Cryotherapy;Electrical Stimulation;Moist Heat;DME Instruction;Gait training;Stair training;Functional mobility training;Therapeutic activities;Therapeutic exercise;Balance training;Neuromuscular re-education;Patient/family education;Manual techniques;Passive range of motion;Dry needling;Vestibular    PT Next Visit Plan Review Pursed lip breathing and add hip strengthening, functional endurance based activities.    PT Home Exercise Plan Instructed in walking program using RPE scale and use of pulse ox    Consulted and Agree with Plan of Care Patient             Patient will benefit from skilled therapeutic intervention in order to improve the following deficits and impairments:  Cardiopulmonary status limiting activity, Decreased activity tolerance, Decreased endurance, Decreased mobility, Decreased strength, Difficulty walking, Pain  Visit Diagnosis: Muscle weakness (generalized)  Difficulty in walking, not elsewhere classified  Other abnormalities of gait and mobility     Problem List Patient Active Problem List   Diagnosis Date Noted   CAD (coronary artery disease) 12/31/2021   Near syncope 11/24/2021   Pulmonary embolism ([place]/13/20[place]   Elevated troponin 11/24/2021   Hypoxia 11/01/2021   Acute on chronic respiratory failure with hypoxia (HCC)    SVT (supraventricular tachycardia) (HCC)    COPD exacerbation (HCC)    Gastroesophageal reflux disease without esophagitis    COVID-19 virus infection 10/31/2021    ChParticia LatherPT 04/12/2022, 4:16 PM  [place] MAIN [facility] SERVICES [address] [place], [place], [address] Phone: [phone]   Fax:  [phone]  Name: Dylan Weber MRN: [id number] Date of Birth:  01-26-1933

## 2022-04-17 ENCOUNTER — Ambulatory Visit: Payer: Medicare Other

## 2022-04-17 DIAGNOSIS — R262 Difficulty in walking, not elsewhere classified: Secondary | ICD-10-CM

## 2022-04-17 DIAGNOSIS — R2689 Other abnormalities of gait and mobility: Secondary | ICD-10-CM

## 2022-04-17 DIAGNOSIS — M6281 Muscle weakness (generalized): Secondary | ICD-10-CM | POA: Diagnosis not present

## 2022-04-17 NOTE — Therapy (Unsigned)
[place] MAIN [facility] SERVICES [place], [place], [address] Phone: [phone]   Fax:  [phone]  Physical Therapy Treatment  Patient Details  Name: Dylan Weber MRN: [id number] Date of Birth: 1933/02/21 Referring Provider (PT): Dr. Ottie Glazier   Encounter Date: 04/17/2022   PT End of Session - 04/17/22 1306     Visit Number 9    Number of Visits 25    Date for PT Re-Evaluation 06/05/22    Authorization Time Period 03/13/2022-06/05/2022    Progress Note Due on Visit 10    PT Start Time 1300    PT Stop Time 1344    PT Time Calculation (min) 44 min    Equipment Utilized During Treatment Gait belt    Activity Tolerance Patient tolerated treatment well    Behavior During Therapy [facility] for tasks assessed/performed             Past Medical History:  Diagnosis Date   COPD (chronic obstructive pulmonary disease) ([place])    Coronary artery calcification seen on CT scan 11/17/2021   COVID-19 56/8127   Diastolic dysfunction    a. 11/2021 Echo: EF 55-60%, no rwma, GrI DD, nl RV fxn. Mild AI.   GERD (gastroesophageal reflux disease)    Pulmonary embolism ([place])    a. 11/2021 CTA Chest: small peripheral PE in RLL branch of PA-->Eliquis.   Right leg DVT ([place])    a. 11/2021 LE U/S: occlusive R calf vein DVT in R peroneal vein-->eliquis.   SVT (supraventricular tachycardia) ([place])    a. Dx 10/2021 in setting of COVID infxn.    Past Surgical History:  Procedure Laterality Date   APPENDECTOMY     CHOLECYSTECTOMY      There were no vitals filed for this visit.   Subjective Assessment - 04/17/22 1305     Subjective Patient reports feeling okay- states he has been working on his sit to stand exercise    Patient is accompained by: Family member   spouse   Pertinent History 88 year old male with referral for general muscle weakness. He has COPD and recent bout of COVID in Dec 2022 and reports progressive weakness since. He does endorse  some ongoing right hip pain with recent injection with no improvement. Patient has 2L O2 as needed but states has not needed lately.    How long can you sit comfortably? >60 min    How long can you stand comfortably? 5-10 min    How long can you walk comfortably? 5-10 min    Patient Stated Goals I want to play golf    Currently in Pain? No/denies             INTERVENTIONS:   Therapeutic Exercises:   2 min step test= 59 steps (counted every right LE) - Patient rated as easy and O2 sat =90% on room air- back up quickly to 96% with pursed lip breathing.   Sit to stand activity: Without UE Support from varying height mat table 25 in height= 3 reps 24 in height= 3 reps 23 in height= 3 reps 22 in height= 3 reps 21 in height= 3 reps 20 in height= 3 reps 19 in standard height straight back chair - 3 reps O2 sat= 92%  -Side step up to 6" box then off opp side - and back up and over x 10 reps -Forward/backward step up/off 6" box x 10 reps.  O2 sat = 91%   -Calf raises 2  sets x 15 reps at support bar (feet on edge of step)  Toe raises 2 sets x 15 reps at support bar   Neuromuscular re-ed:   Staggered standing on airex pad - dual task playing word game on dry erase board - switching stance every 1 min x 5 min  Tandem stand on firm surface- Dual task playing word game on dry erase board- switching stance every 1 min x 5 min.   Education provided throughout session via VC/TC and demonstration to facilitate movement at target joints and correct muscle activation for all testing and exercises performed. Rationale for Evaluation and Treatment Rehabilitation                  PT Education - 04/17/22 1306     Education Details Exercise technique    Person(s) Educated Patient    Methods Explanation;Demonstration;Tactile cues;Verbal cues    Comprehension Verbalized understanding;Returned demonstration;Verbal cues required;Tactile cues required;Need further instruction               PT Short Term Goals - 03/14/22 1705       PT SHORT TERM GOAL #1   Title Pt will be independent with initial HEP in order to improve strength and balance in order to decrease fall risk and improve function at home and work    Baseline 03/13/2022= Patient presents with no formal HEP in place    Time 6    Period Weeks    Status New    Target Date 04/24/22      PT SHORT TERM GOAL #2   Title Patient will be iindependent with knowledge and performance of pursed lip breathing for imroved recovery of breathing.    Baseline 03/13/2022- Patient with limited knowledge of how to recover from shortness of breath.    Time 6    Period Weeks    Status New    Target Date 04/24/22               PT Long Term Goals - 03/14/22 1708       PT LONG TERM GOAL #1   Title Pt will be independent with Final HEP in order to improve strength and balance in order to decrease fall risk and improve function at home and work    Baseline 03/13/2022= Patient presents with no formal HEP in place    Time 12    Period Weeks    Status New    Target Date 06/05/22      PT LONG TERM GOAL #2   Title Pt will improve FOTO to target score of 66 to display perceived improvements in ability to complete ADL's.    Baseline 03/13/2022=57    Time 12    Period Weeks    Status New    Target Date 06/05/22      PT LONG TERM GOAL #3   Title Pt will decrease 5TSTS by at least 2 seconds inlcuding ability to perform with LE only in order to demonstrate clinically significant improvement in LE strength.    Baseline 03/13/2022= 15.12 sec with BUE Support    Time 12    Period Weeks    Status New    Target Date 06/05/22      PT LONG TERM GOAL #4   Title Pt will increase 6MWT by at least 61m(1659f- maintaining > 90% O2 sat in order to demonstrate clinically significant improvement in cardiopulmonary endurance and community ambulation    Baseline 03/13/2022= 970 feet using no  AD    Time 12    Period Weeks    Status New     Target Date 06/05/22                   Plan - 04/17/22 1307     Clinical Impression Statement Patient presents with good motivation for today's session. He was able to keep his O2 sat > 90% with workout and performed well overall- able to complete activities. He was able to improve with sit to stand strength today as seen by ability to stand without UE support from varying heights. Patient will continue to benefit from skilled physical therapy interventions in order to improve endurance, balance, in order to help him return to previous level of independence with community activities.    Personal Factors and Comorbidities Age;Comorbidity 2    Comorbidities COPD; HTN    Examination-Activity Limitations Bend;Carry;Lift;Squat;Other;Stand    Examination-Participation Restrictions Cleaning;Community Activity;Yard Work    [employer] Evolving/Moderate complexity    Rehab Potential Good    PT Frequency 2x / week    PT Duration 12 weeks    PT Treatment/Interventions ADLs/Self Care Home Management;Canalith Repostioning;Cryotherapy;Electrical Stimulation;Moist Heat;DME Instruction;Gait training;Stair training;Functional mobility training;Therapeutic activities;Therapeutic exercise;Balance training;Neuromuscular re-education;Patient/family education;Manual techniques;Passive range of motion;Dry needling;Vestibular    PT Next Visit Plan Review Pursed lip breathing and add hip strengthening, functional endurance based activities.    PT Home Exercise Plan Instructed in walking program using RPE scale and use of pulse ox    Consulted and Agree with Plan of Care Patient             Patient will benefit from skilled therapeutic intervention in order to improve the following deficits and impairments:  Cardiopulmonary status limiting activity, Decreased activity tolerance, Decreased endurance, Decreased mobility, Decreased strength, Difficulty walking, Pain  Visit  Diagnosis: Muscle weakness (generalized)  Difficulty in walking, not elsewhere classified  Other abnormalities of gait and mobility     Problem List Patient Active Problem List   Diagnosis Date Noted   CAD (coronary artery disease) 12/31/2021   Near syncope 11/24/2021   Pulmonary embolism ([place]) 11/24/2021   Elevated troponin 11/24/2021   Hypoxia 11/01/2021   Acute on chronic respiratory failure with hypoxia (HCC)    SVT (supraventricular tachycardia) (HCC)    COPD exacerbation (HCC)    Gastroesophageal reflux disease without esophagitis    COVID-19 virus infection 10/31/2021    Lewis Moccasin, PT 04/18/2022, 9:01 AM  [place] MAIN [facility] SERVICES [address] [place], [place], [address] Phone: [phone]   Fax:  [phone]  Name: Dylan Weber MRN: [id number] Date of Birth: 07/15/1933

## 2022-04-19 ENCOUNTER — Ambulatory Visit: Payer: Medicare Other

## 2022-04-19 DIAGNOSIS — R2689 Other abnormalities of gait and mobility: Secondary | ICD-10-CM

## 2022-04-19 DIAGNOSIS — R262 Difficulty in walking, not elsewhere classified: Secondary | ICD-10-CM

## 2022-04-19 DIAGNOSIS — M6281 Muscle weakness (generalized): Secondary | ICD-10-CM | POA: Diagnosis not present

## 2022-04-19 NOTE — Therapy (Signed)
[place] MAIN [facility] SERVICES [address] [place], [place], [address] Phone: [phone]   Fax:  [phone]  Physical Therapy Treatment/Physical Therapy Progress Note   Dates of reporting period  03/13/2022  to   04/19/2022  Patient Details  Name: Tatum Massman MRN: [id number] Date of Birth: 31-Aug-1933 Referring Provider (PT): Dr. Ottie Glazier   Encounter Date: 04/19/2022   PT End of Session - 04/19/22 1252     Visit Number 10    Number of Visits 25    Date for PT Re-Evaluation 06/05/22    Authorization Time Period 03/13/2022-06/05/2022    Progress Note Due on Visit 20    PT Start Time 1258    PT Stop Time 1344    PT Time Calculation (min) 46 min    Equipment Utilized During Treatment Gait belt    Activity Tolerance Patient tolerated treatment well    Behavior During Therapy [facility] for tasks assessed/performed             Past Medical History:  Diagnosis Date   COPD (chronic obstructive pulmonary disease) ([place])    Coronary artery calcification seen on CT scan 11/17/2021   COVID-19 54/5625   Diastolic dysfunction    a. 11/2021 Echo: EF 55-60%, no rwma, GrI DD, nl RV fxn. Mild AI.   GERD (gastroesophageal reflux disease)    Pulmonary embolism ([place])    a. 11/2021 CTA Chest: small peripheral PE in RLL branch of PA-->Eliquis.   Right leg DVT ([place])    a. 11/2021 LE U/S: occlusive R calf vein DVT in R peroneal vein-->eliquis.   SVT (supraventricular tachycardia) ([place])    a. Dx 10/2021 in setting of COVID infxn.    Past Surgical History:  Procedure Laterality Date   APPENDECTOMY     CHOLECYSTECTOMY      There were no vitals filed for this visit.   Subjective Assessment - 04/19/22 1251     Subjective Patient reports doing good today and no complaints    Patient is accompained by: Family member   spouse   Pertinent History 88 year old male with referral for general muscle weakness. He has COPD and recent bout of COVID in Dec 2022 and  reports progressive weakness since. He does endorse some ongoing right hip pain with recent injection with no improvement. Patient has 2L O2 as needed but states has not needed lately.    How long can you sit comfortably? >60 min    How long can you stand comfortably? 5-10 min    How long can you walk comfortably? 5-10 min    Patient Stated Goals I want to play golf    Currently in Pain? No/denies              INTERVENTIONS:   O2 sat= 96% at rest   Review of all goals for progress note #10.    Initial HEP:  Patient reports he is doing his bike riding and torso twist along with walking.   Pursed lip breathing: Patient verbalized and demonstrates independence with pursed lip breathing technique.    FOTO: 66   5 time STS= 19.14 sec with hands on knees (only minimal UE support) .    6 min walk test= 1050 feet without AD (O2 sat= 90%)    *patient reported difficulty swinging golf club and making contact with ball- difficulty with balance.   Simulated golf swing in clinic - using matrix cable system 2.5 lb x 10 swings from  firm surface and 10 reps from standing on airex pad         PT Education - 04/19/22 1251     Education Details Exercise technique    Person(s) Educated Patient    Methods Explanation;Demonstration;Tactile cues;Verbal cues    Comprehension Verbalized understanding;Returned demonstration;Verbal cues required;Tactile cues required;Need further instruction              PT Short Term Goals - 04/19/22 1255       PT SHORT TERM GOAL #1   Title Pt will be independent with initial HEP in order to improve strength and balance in order to decrease fall risk and improve function at home and work    Baseline 03/13/2022= Patient presents with no formal HEP in place; 04/19/2022=Patient reports he is doing his bike riding and torso twist along with walking.    Time 6    Period Weeks    Status On-going    Target Date 04/24/22      PT SHORT TERM GOAL #2    Title Patient will be iindependent with knowledge and performance of pursed lip breathing for imroved recovery of breathing.    Baseline 03/13/2022- Patient with limited knowledge of how to recover from shortness of breath. 04/19/2022-Patient verbalized and demonstrates independence with pursed lip breathing technique.    Time 6    Period Weeks    Status Achieved    Target Date 04/24/22               PT Long Term Goals - 04/19/22 1256       PT LONG TERM GOAL #1   Title Pt will be independent with Final HEP in order to improve strength and balance in order to decrease fall risk and improve function at home and work    Baseline 03/13/2022= Patient presents with no formal HEP in place    Time 12    Period Weeks    Status New    Target Date 06/05/22      PT LONG TERM GOAL #2   Title Pt will improve FOTO to target score of 66 to display perceived improvements in ability to complete ADL's.    Baseline 03/13/2022=57: 04/19/2022=66    Time 12    Period Weeks    Status On-going    Target Date 06/05/22      PT LONG TERM GOAL #3   Title Pt will decrease 5TSTS by at least 2 seconds inlcuding ability to perform with LE only in order to demonstrate clinically significant improvement in LE strength.    Baseline 03/13/2022= 15.12 sec with BUE Support; 04/19/2022=19.14 sec with hands on knees (minimal UE support).    Time 12    Period Weeks    Status On-going    Target Date 06/05/22      PT LONG TERM GOAL #4   Title Pt will increase 6MWT by at least 43m(1681f- maintaining > 90% O2 sat in order to demonstrate clinically significant improvement in cardiopulmonary endurance and community ambulation    Baseline 03/13/2022= 970 feet using no AD; 04/19/2022= 1050 feet = 90%SpO2    Time 12    Period Weeks    Status On-going    Target Date 06/05/22                   Plan - 04/19/22 1253     Clinical Impression Statement Patient presents to clinic with good motivation today and performed well  with progress report testing. He  demo good progress in self perceived abilities as seen by improved FOTO score. He had an increased time required for 5 sec sit to stand yet required less UE support and able to stand today with more LE strength/power. He also improved with his distance in 6 min walk- demo improved overall gait efficiency and his O2 sat remained at 90%. Patient's condition has the potential to improve in response to therapy. Maximum improvement is yet to be obtained. The anticipated improvement is attainable and reasonable in a generally predictable time.   Patient will continue to benefit from skilled physical therapy interventions in order to improve endurance, balance, in order to help him return to previous level of independence with community activities.    Personal Factors and Comorbidities Age;Comorbidity 2    Comorbidities COPD; HTN    Examination-Activity Limitations Bend;Carry;Lift;Squat;Other;Stand    Examination-Participation Restrictions Cleaning;Community Activity;Yard Work    [employer] officer Evolving/Moderate complexity    Rehab Potential Good    PT Frequency 2x / week    PT Duration 12 weeks    PT Treatment/Interventions ADLs/Self Care Home Management;Canalith Repostioning;Cryotherapy;Electrical Stimulation;Moist Heat;DME Instruction;Gait training;Stair training;Functional mobility training;Therapeutic activities;Therapeutic exercise;Balance training;Neuromuscular re-education;Patient/family education;Manual techniques;Passive range of motion;Dry needling;Vestibular    PT Next Visit Plan Review Pursed lip breathing and add hip strengthening, functional endurance based activities.    PT Home Exercise Plan Instructed in walking program using RPE scale and use of pulse ox    Consulted and Agree with Plan of Care Patient             Patient will benefit from skilled therapeutic intervention in order to improve the following deficits and impairments:   Cardiopulmonary status limiting activity, Decreased activity tolerance, Decreased endurance, Decreased mobility, Decreased strength, Difficulty walking, Pain  Visit Diagnosis: Muscle weakness (generalized)  Difficulty in walking, not elsewhere classified  Other abnormalities of gait and mobility     Problem List Patient Active Problem List   Diagnosis Date Noted   CAD (coronary artery disease) 12/31/2021   Near syncope 11/24/2021   Pulmonary embolism ([place]) 11/24/2021   Elevated troponin 11/24/2021   Hypoxia 11/01/2021   Acute on chronic respiratory failure with hypoxia (HCC)    SVT (supraventricular tachycardia) (HCC)    COPD exacerbation (HCC)    Gastroesophageal reflux disease without esophagitis    COVID-19 virus infection 10/31/2021    Lewis Moccasin, PT 04/19/2022, 5:38 PM  [place] MAIN [facility] SERVICES [address] [place], [place], [address] Phone: [phone]   Fax:  [phone]  Name: Amore Grater MRN: [id number] Date of Birth: 1933-07-02

## 2022-04-24 ENCOUNTER — Ambulatory Visit: Payer: Medicare Other

## 2022-04-24 DIAGNOSIS — R262 Difficulty in walking, not elsewhere classified: Secondary | ICD-10-CM

## 2022-04-24 DIAGNOSIS — M6281 Muscle weakness (generalized): Secondary | ICD-10-CM

## 2022-04-24 DIAGNOSIS — R2689 Other abnormalities of gait and mobility: Secondary | ICD-10-CM

## 2022-04-24 NOTE — Therapy (Unsigned)
[place] MAIN [facility] SERVICES [place], [place], [address] Phone: [phone]   Fax:  [phone]  Physical Therapy Treatment  Patient Details  Name: Dylan Weber MRN: [id number] Date of Birth: 17-Mar-1933 Referring Provider (PT): Dr. Ottie Glazier   Encounter Date: 04/24/2022   PT End of Session - 04/24/22 1304     Visit Number 11    Number of Visits 25    Date for PT Re-Evaluation 06/05/22    Authorization Time Period 03/13/2022-06/05/2022    Progress Note Due on Visit 20    PT Start Time 1300    PT Stop Time 1344    PT Time Calculation (min) 44 min    Equipment Utilized During Treatment Gait belt    Activity Tolerance Patient tolerated treatment well    Behavior During Therapy [facility] for tasks assessed/performed             Past Medical History:  Diagnosis Date   COPD (chronic obstructive pulmonary disease) ([place])    Coronary artery calcification seen on CT scan 11/17/2021   COVID-19 27/0350   Diastolic dysfunction    a. 11/2021 Echo: EF 55-60%, no rwma, GrI DD, nl RV fxn. Mild AI.   GERD (gastroesophageal reflux disease)    Pulmonary embolism ([place])    a. 11/2021 CTA Chest: small peripheral PE in RLL branch of PA-->Eliquis.   Right leg DVT ([place])    a. 11/2021 LE U/S: occlusive R calf vein DVT in R peroneal vein-->eliquis.   SVT (supraventricular tachycardia) ([place])    a. Dx 10/2021 in setting of COVID infxn.    Past Surgical History:  Procedure Laterality Date   APPENDECTOMY     CHOLECYSTECTOMY      There were no vitals filed for this visit.   Subjective Assessment - 04/24/22 1300     Subjective Patient reports being tired from putting a new rack in his garage to store his golf equipment.    Patient is accompained by: Family member   spouse   Pertinent History 88 year old male with referral for general muscle weakness. He has COPD and recent bout of COVID in Dec 2022 and reports progressive weakness since. He  does endorse some ongoing right hip pain with recent injection with no improvement. Patient has 2L O2 as needed but states has not needed lately.    How long can you sit comfortably? >60 min    How long can you stand comfortably? 5-10 min    How long can you walk comfortably? 5-10 min    Patient Stated Goals I want to play golf    Currently in Pain? No/denies              INTERVENTIONS:    Therex:   O2 sat= 94% 2 min step test: 55 steps (counted every right LE) - O2 sat 91% on right index finger  Sit to stand without UE support from regular height chair without UE x 7 reps max.   Step up onto 1st step with BUE Support x 10 reps alt LE O2 sat= 89% up to 94% with pursed lip breathing.   Seated step up and over orange hurdle BLE x 10-12 reps O2 sat= 91%  Standing calf raises x 12 reps x 2 sets  Stand side squat/lunge x 10 reps each O2 sat= 91%   No significant dyspnea despite low 90's O2 sat.    Neuromuscular re-ed:   Tandem standing on firm surface- hold  30 sec (mild instability SLS Side step (standing on curve side of 1/2 foam- left to right then back x 10) Tandem stand on 1/2 foam-attempted to stand x 5-10 sec x multiple attempts.   Forward/backward  step over 1/2 foam roll x 12 reps  Side step over 1/2 foam roll x 12 reps.  Education provided throughout session via VC/TC and demonstration to facilitate movement at target joints and correct muscle activation for all testing and exercises performed. Rationale for Evaluation and Treatment Rehabilitation                  PT Education - 04/24/22 1304     Education Details Exercise technique    Person(s) Educated Patient    Methods Explanation;Demonstration;Tactile cues;Verbal cues    Comprehension Verbalized understanding;Returned demonstration;Verbal cues required;Tactile cues required;Need further instruction              PT Short Term Goals - 04/19/22 1255       PT SHORT TERM GOAL #1    Title Pt will be independent with initial HEP in order to improve strength and balance in order to decrease fall risk and improve function at home and work    Baseline 03/13/2022= Patient presents with no formal HEP in place; 04/19/2022=Patient reports he is doing his bike riding and torso twist along with walking.    Time 6    Period Weeks    Status On-going    Target Date 04/24/22      PT SHORT TERM GOAL #2   Title Patient will be iindependent with knowledge and performance of pursed lip breathing for imroved recovery of breathing.    Baseline 03/13/2022- Patient with limited knowledge of how to recover from shortness of breath. 04/19/2022-Patient verbalized and demonstrates independence with pursed lip breathing technique.    Time 6    Period Weeks    Status Achieved    Target Date 04/24/22               PT Long Term Goals - 04/19/22 1256       PT LONG TERM GOAL #1   Title Pt will be independent with Final HEP in order to improve strength and balance in order to decrease fall risk and improve function at home and work    Baseline 03/13/2022= Patient presents with no formal HEP in place    Time 12    Period Weeks    Status New    Target Date 06/05/22      PT LONG TERM GOAL #2   Title Pt will improve FOTO to target score of 66 to display perceived improvements in ability to complete ADL's.    Baseline 03/13/2022=57: 04/19/2022=66    Time 12    Period Weeks    Status On-going    Target Date 06/05/22      PT LONG TERM GOAL #3   Title Pt will decrease 5TSTS by at least 2 seconds inlcuding ability to perform with LE only in order to demonstrate clinically significant improvement in LE strength.    Baseline 03/13/2022= 15.12 sec with BUE Support; 04/19/2022=19.14 sec with hands on knees (minimal UE support).    Time 12    Period Weeks    Status On-going    Target Date 06/05/22      PT LONG TERM GOAL #4   Title Pt will increase 6MWT by at least 36m(1662f- maintaining > 90% O2 sat in  order to demonstrate clinically significant improvement in  cardiopulmonary endurance and community ambulation    Baseline 03/13/2022= 970 feet using no AD; 04/19/2022= 1050 feet = 90%SpO2    Time 12    Period Weeks    Status On-going    Target Date 06/05/22                   Plan - 04/24/22 1304     Clinical Impression Statement Patient presented today with good motivation and participated well in therex and neuromuscular activities. He presented with lower O2 sats yet no reported or obvious signs of SOB. He was challenged with more dynamic balance activities particularly single leg standing and tandem. He was able to perform well with decreased rest  breaks today. Patient will continue to benefit from skilled physical therapy interventions in order to improve endurance, balance, in order to help him return to previous level of independence with community activities.    Personal Factors and Comorbidities Age;Comorbidity 2    Comorbidities COPD; HTN    Examination-Activity Limitations Bend;Carry;Lift;Squat;Other;Stand    Examination-Participation Restrictions Cleaning;Community Activity;Yard Work    [employer] officer Evolving/Moderate complexity    Rehab Potential Good    PT Frequency 2x / week    PT Duration 12 weeks    PT Treatment/Interventions ADLs/Self Care Home Management;Canalith Repostioning;Cryotherapy;Electrical Stimulation;Moist Heat;DME Instruction;Gait training;Stair training;Functional mobility training;Therapeutic activities;Therapeutic exercise;Balance training;Neuromuscular re-education;Patient/family education;Manual techniques;Passive range of motion;Dry needling;Vestibular    PT Next Visit Plan Review Pursed lip breathing and add hip strengthening, functional endurance based activities.    PT Home Exercise Plan Instructed in walking program using RPE scale and use of pulse ox    Consulted and Agree with Plan of Care Patient             Patient  will benefit from skilled therapeutic intervention in order to improve the following deficits and impairments:  Cardiopulmonary status limiting activity, Decreased activity tolerance, Decreased endurance, Decreased mobility, Decreased strength, Difficulty walking, Pain  Visit Diagnosis: Muscle weakness (generalized)  Difficulty in walking, not elsewhere classified  Other abnormalities of gait and mobility     Problem List Patient Active Problem List   Diagnosis Date Noted   CAD (coronary artery disease) 12/31/2021   Near syncope 11/24/2021   Pulmonary embolism ([place]) 11/24/2021   Elevated troponin 11/24/2021   Hypoxia 11/01/2021   Acute on chronic respiratory failure with hypoxia (HCC)    SVT (supraventricular tachycardia) (HCC)    COPD exacerbation (HCC)    Gastroesophageal reflux disease without esophagitis    COVID-19 virus infection 10/31/2021    Lewis Moccasin, PT 04/25/2022, 9:08 AM  [place] MAIN [facility] SERVICES [address] [place], [place], [address] Phone: [phone]   Fax:  [phone]  Name: Dylan Weber MRN: [id number] Date of Birth: 1933/01/16

## 2022-04-26 ENCOUNTER — Ambulatory Visit: Payer: Medicare Other

## 2022-04-26 DIAGNOSIS — R262 Difficulty in walking, not elsewhere classified: Secondary | ICD-10-CM

## 2022-04-26 DIAGNOSIS — R2689 Other abnormalities of gait and mobility: Secondary | ICD-10-CM

## 2022-04-26 DIAGNOSIS — M6281 Muscle weakness (generalized): Secondary | ICD-10-CM | POA: Diagnosis not present

## 2022-04-26 NOTE — Therapy (Signed)
[place] MAIN [facility] SERVICES [address] [place], [place], [address] Phone: [phone]   Fax:  [phone]  Physical Therapy Treatment  Patient Details  Name: Dylan Weber MRN: [id number] Date of Birth: 1933/06/05 Referring Provider (PT): Dr. Ottie Glazier   Encounter Date: 04/26/2022   PT End of Session - 04/26/22 1254     Visit Number 12    Number of Visits 25    Date for PT Re-Evaluation 06/05/22    Authorization Time Period 03/13/2022-06/05/2022    Progress Note Due on Visit 3    PT Start Time 1258    PT Stop Time 1343    PT Time Calculation (min) 45 min    Equipment Utilized During Treatment Gait belt    Activity Tolerance Patient tolerated treatment well    Behavior During Therapy [facility] for tasks assessed/performed             Past Medical History:  Diagnosis Date   COPD (chronic obstructive pulmonary disease) ([place])    Coronary artery calcification seen on CT scan 11/17/2021   COVID-19 76/2831   Diastolic dysfunction    a. 11/2021 Echo: EF 55-60%, no rwma, GrI DD, nl RV fxn. Mild AI.   GERD (gastroesophageal reflux disease)    Pulmonary embolism ([place])    a. 11/2021 CTA Chest: small peripheral PE in RLL branch of PA-->Eliquis.   Right leg DVT ([place])    a. 11/2021 LE U/S: occlusive R calf vein DVT in R peroneal vein-->eliquis.   SVT (supraventricular tachycardia) ([place])    a. Dx 10/2021 in setting of COVID infxn.    Past Surgical History:  Procedure Laterality Date   APPENDECTOMY     CHOLECYSTECTOMY      There were no vitals filed for this visit.   Subjective Assessment - 04/26/22 1254     Subjective Patient reports being tired from putting a new rack in his garage to store his golf equipment.    Patient is accompained by: Family member   spouse   Pertinent History 88 year old male with referral for general muscle weakness. He has COPD and recent bout of COVID in Dec 2022 and reports progressive weakness since. He  does endorse some ongoing right hip pain with recent injection with no improvement. Patient has 2L O2 as needed but states has not needed lately.    How long can you sit comfortably? >60 min    How long can you stand comfortably? 5-10 min    How long can you walk comfortably? 5-10 min    Patient Stated Goals I want to play golf    Currently in Pain? No/denies             INTERVENTIONS:   Interval Nustep: LE Only (seat level 9) - VC   20mn- L0 30 sec - L3 1 min - L0 30 sec - L3- O2 sat = 94% HR = 89 bpm 1 min- L0 30sec - L4 1 min- L0 30 sec L5  Patient rated at a 5/10 on RPE. O2 sat =93%  Ladder drills - Forward/backward in // bars- x 10 reps  Ladder drills- Side stepping in // bars down and back x 6 each. Ladder drills outside of // bars - Step into and out of squares Ladder drills - outside of // bars - step wide (outside of square) then into square- Focusing on coordination  Sit to stand from airex pad in chair x 10 reps  Sit to  stand from regular height chair x 5 reps - fatigued and reported 5/10 on RPE scale.   Step up + opp LE high knee raise in // bars with BUE support x 10 reps each LE. O2 sat= 92%   Education provided throughout session via VC/TC and demonstration to facilitate movement at target joints and correct muscle activation for all testing and exercises performed.   Rationale for Evaluation and Treatment Rehabilitation                          PT Education - 04/26/22 1254     Education Details exercise technique    Person(s) Educated Patient    Methods Explanation;Demonstration;Tactile cues;Verbal cues    Comprehension Verbalized understanding;Returned demonstration;Verbal cues required;Need further instruction;Tactile cues required              PT Short Term Goals - 04/19/22 1255       PT SHORT TERM GOAL #1   Title Pt will be independent with initial HEP in order to improve strength and balance in order to decrease fall  risk and improve function at home and work    Baseline 03/13/2022= Patient presents with no formal HEP in place; 04/19/2022=Patient reports he is doing his bike riding and torso twist along with walking.    Time 6    Period Weeks    Status On-going    Target Date 04/24/22      PT SHORT TERM GOAL #2   Title Patient will be iindependent with knowledge and performance of pursed lip breathing for imroved recovery of breathing.    Baseline 03/13/2022- Patient with limited knowledge of how to recover from shortness of breath. 04/19/2022-Patient verbalized and demonstrates independence with pursed lip breathing technique.    Time 6    Period Weeks    Status Achieved    Target Date 04/24/22               PT Long Term Goals - 04/19/22 1256       PT LONG TERM GOAL #1   Title Pt will be independent with Final HEP in order to improve strength and balance in order to decrease fall risk and improve function at home and work    Baseline 03/13/2022= Patient presents with no formal HEP in place    Time 12    Period Weeks    Status New    Target Date 06/05/22      PT LONG TERM GOAL #2   Title Pt will improve FOTO to target score of 66 to display perceived improvements in ability to complete ADL's.    Baseline 03/13/2022=57: 04/19/2022=66    Time 12    Period Weeks    Status On-going    Target Date 06/05/22      PT LONG TERM GOAL #3   Title Pt will decrease 5TSTS by at least 2 seconds inlcuding ability to perform with LE only in order to demonstrate clinically significant improvement in LE strength.    Baseline 03/13/2022= 15.12 sec with BUE Support; 04/19/2022=19.14 sec with hands on knees (minimal UE support).    Time 12    Period Weeks    Status On-going    Target Date 06/05/22      PT LONG TERM GOAL #4   Title Pt will increase 6MWT by at least 89m(1620f- maintaining > 90% O2 sat in order to demonstrate clinically significant improvement in cardiopulmonary endurance and community ambulation  Baseline 03/13/2022= 970 feet using no AD; 04/19/2022= 1050 feet = 90%SpO2    Time 12    Period Weeks    Status On-going    Target Date 06/05/22                   Plan - 04/26/22 1258     Clinical Impression Statement Patient presented with increased difficulty initially with coordination using ladder drill. He did improve with practice and no significant LOB. He presented with some observed fatigue during therex but all O2 sat > 90%. He also demo increased fatigue after performing several reps with  sit to stand. He performed well with interval training on Nustep and will continue to benefit from aerobic based exercises. Patient will continue to benefit from skilled physical therapy interventions in order to improve endurance, balance, in order to help him return to previous level of independence with community activities.    Personal Factors and Comorbidities Age;Comorbidity 2    Comorbidities COPD; HTN    Examination-Activity Limitations Bend;Carry;Lift;Squat;Other;Stand    Examination-Participation Restrictions Cleaning;Community Activity;Yard Work    [employer] officer Evolving/Moderate complexity    Rehab Potential Good    PT Frequency 2x / week    PT Duration 12 weeks    PT Treatment/Interventions ADLs/Self Care Home Management;Canalith Repostioning;Cryotherapy;Electrical Stimulation;Moist Heat;DME Instruction;Gait training;Stair training;Functional mobility training;Therapeutic activities;Therapeutic exercise;Balance training;Neuromuscular re-education;Patient/family education;Manual techniques;Passive range of motion;Dry needling;Vestibular    PT Next Visit Plan Review Pursed lip breathing and add hip strengthening, functional endurance based activities.    PT Home Exercise Plan Instructed in walking program using RPE scale and use of pulse ox    Consulted and Agree with Plan of Care Patient             Patient will benefit from skilled therapeutic  intervention in order to improve the following deficits and impairments:  Cardiopulmonary status limiting activity, Decreased activity tolerance, Decreased endurance, Decreased mobility, Decreased strength, Difficulty walking, Pain  Visit Diagnosis: Difficulty in walking, not elsewhere classified  Muscle weakness (generalized)  Other abnormalities of gait and mobility     Problem List Patient Active Problem List   Diagnosis Date Noted   CAD (coronary artery disease) 12/31/2021   Near syncope 11/24/2021   Pulmonary embolism ([place]) 11/24/2021   Elevated troponin 11/24/2021   Hypoxia 11/01/2021   Acute on chronic respiratory failure with hypoxia (HCC)    SVT (supraventricular tachycardia) (HCC)    COPD exacerbation (HCC)    Gastroesophageal reflux disease without esophagitis    COVID-19 virus infection 10/31/2021    Lewis Moccasin, PT 04/26/2022, 2:06 PM  [place] MAIN [facility] SERVICES [address] [place], [place], [address] Phone: [phone]   Fax:  [phone]  Name: Rylyn Ranganathan MRN: [id number] Date of Birth: 27-Dec-1932

## 2022-05-01 ENCOUNTER — Ambulatory Visit: Payer: Medicare Other

## 2022-05-03 ENCOUNTER — Ambulatory Visit: Payer: Medicare Other

## 2022-05-08 ENCOUNTER — Ambulatory Visit: Payer: Medicare Other

## 2022-05-08 DIAGNOSIS — R2689 Other abnormalities of gait and mobility: Secondary | ICD-10-CM

## 2022-05-08 DIAGNOSIS — M6281 Muscle weakness (generalized): Secondary | ICD-10-CM | POA: Diagnosis not present

## 2022-05-08 DIAGNOSIS — R262 Difficulty in walking, not elsewhere classified: Secondary | ICD-10-CM

## 2022-05-08 IMAGING — CR DG CHEST 2V
2 series · 2 of 2 positions shown · non-contrast
Comparison: Chest radiograph 10/31/2021, chest CTA 11/17/2020

CLINICAL DATA: Shortness of breath and decreased breath sounds.
Shortness of breath today, worse with exertion.

EXAM:
CHEST - 2 VIEW

[chest pa]
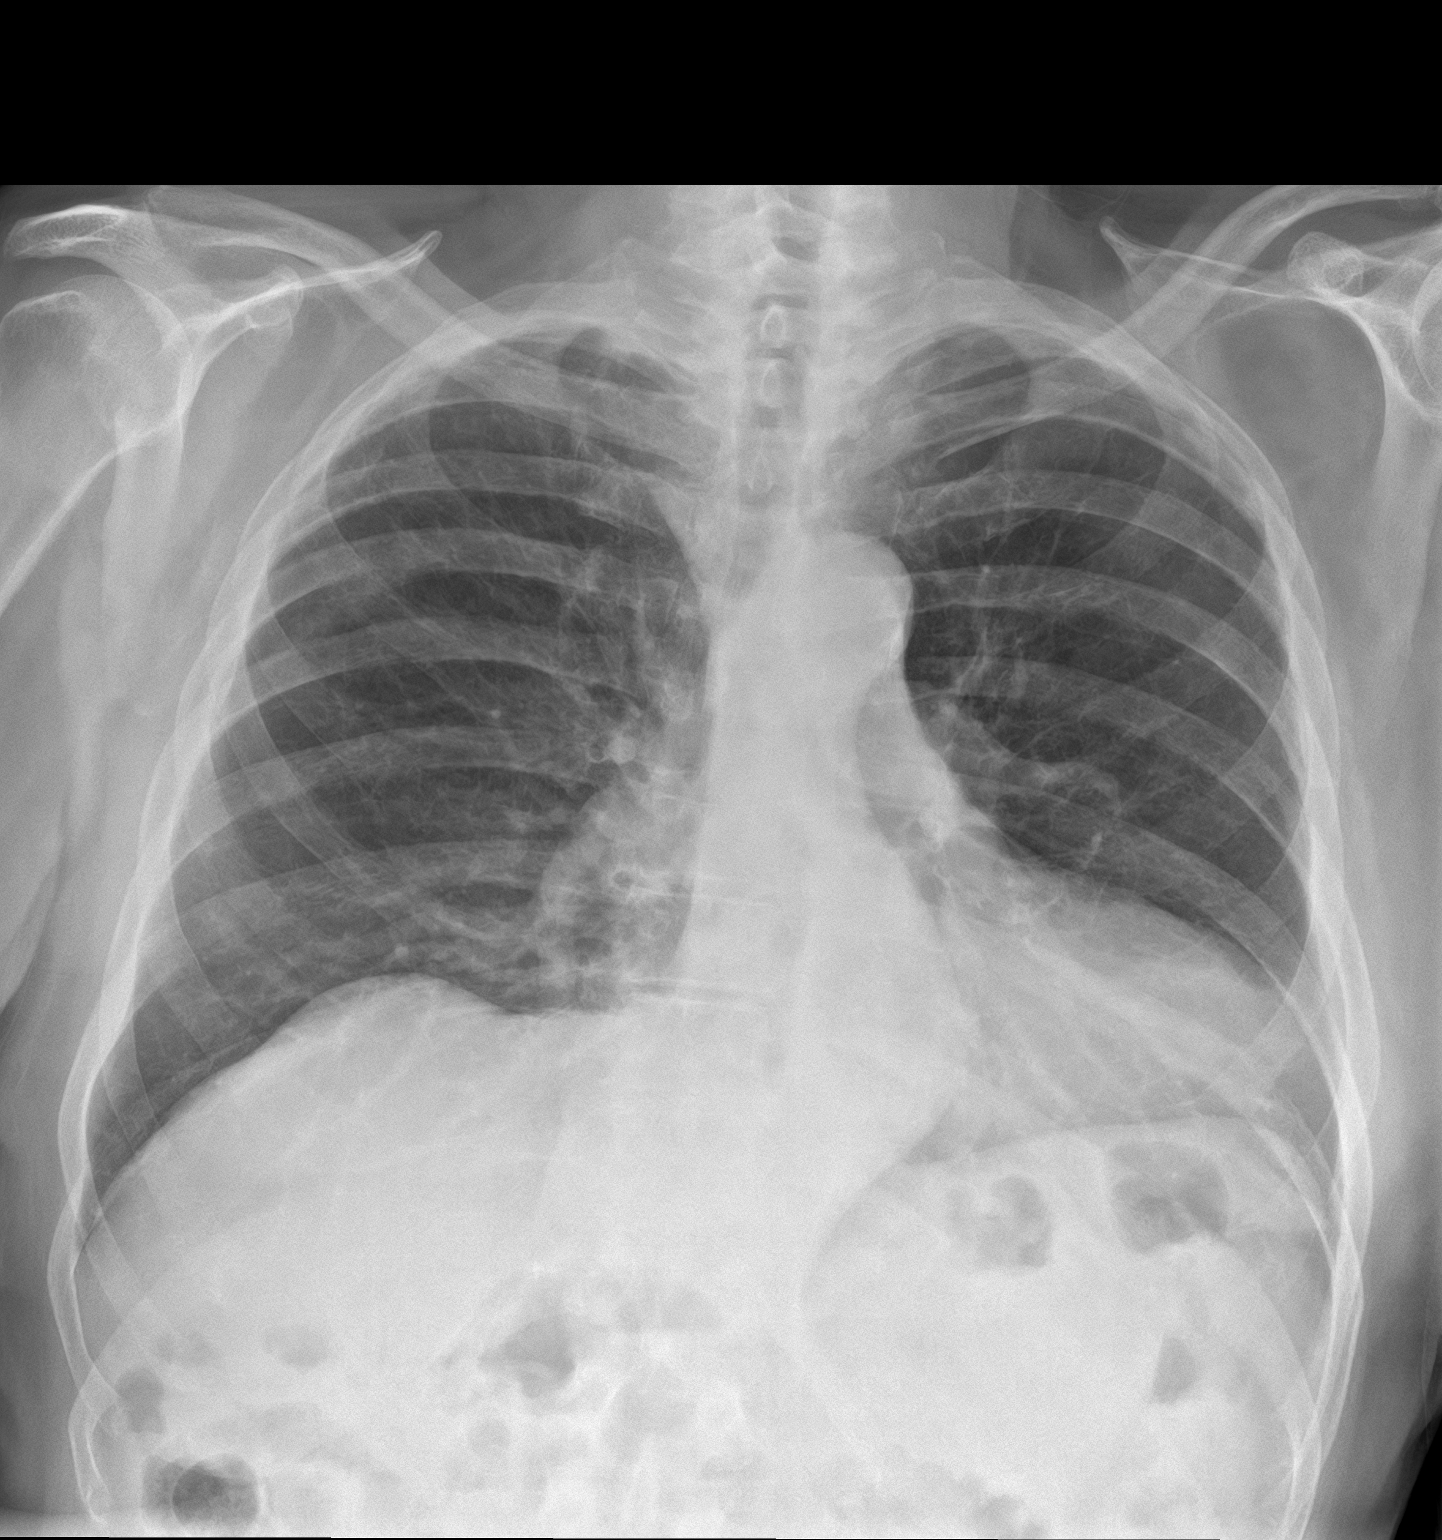

[chest lat]
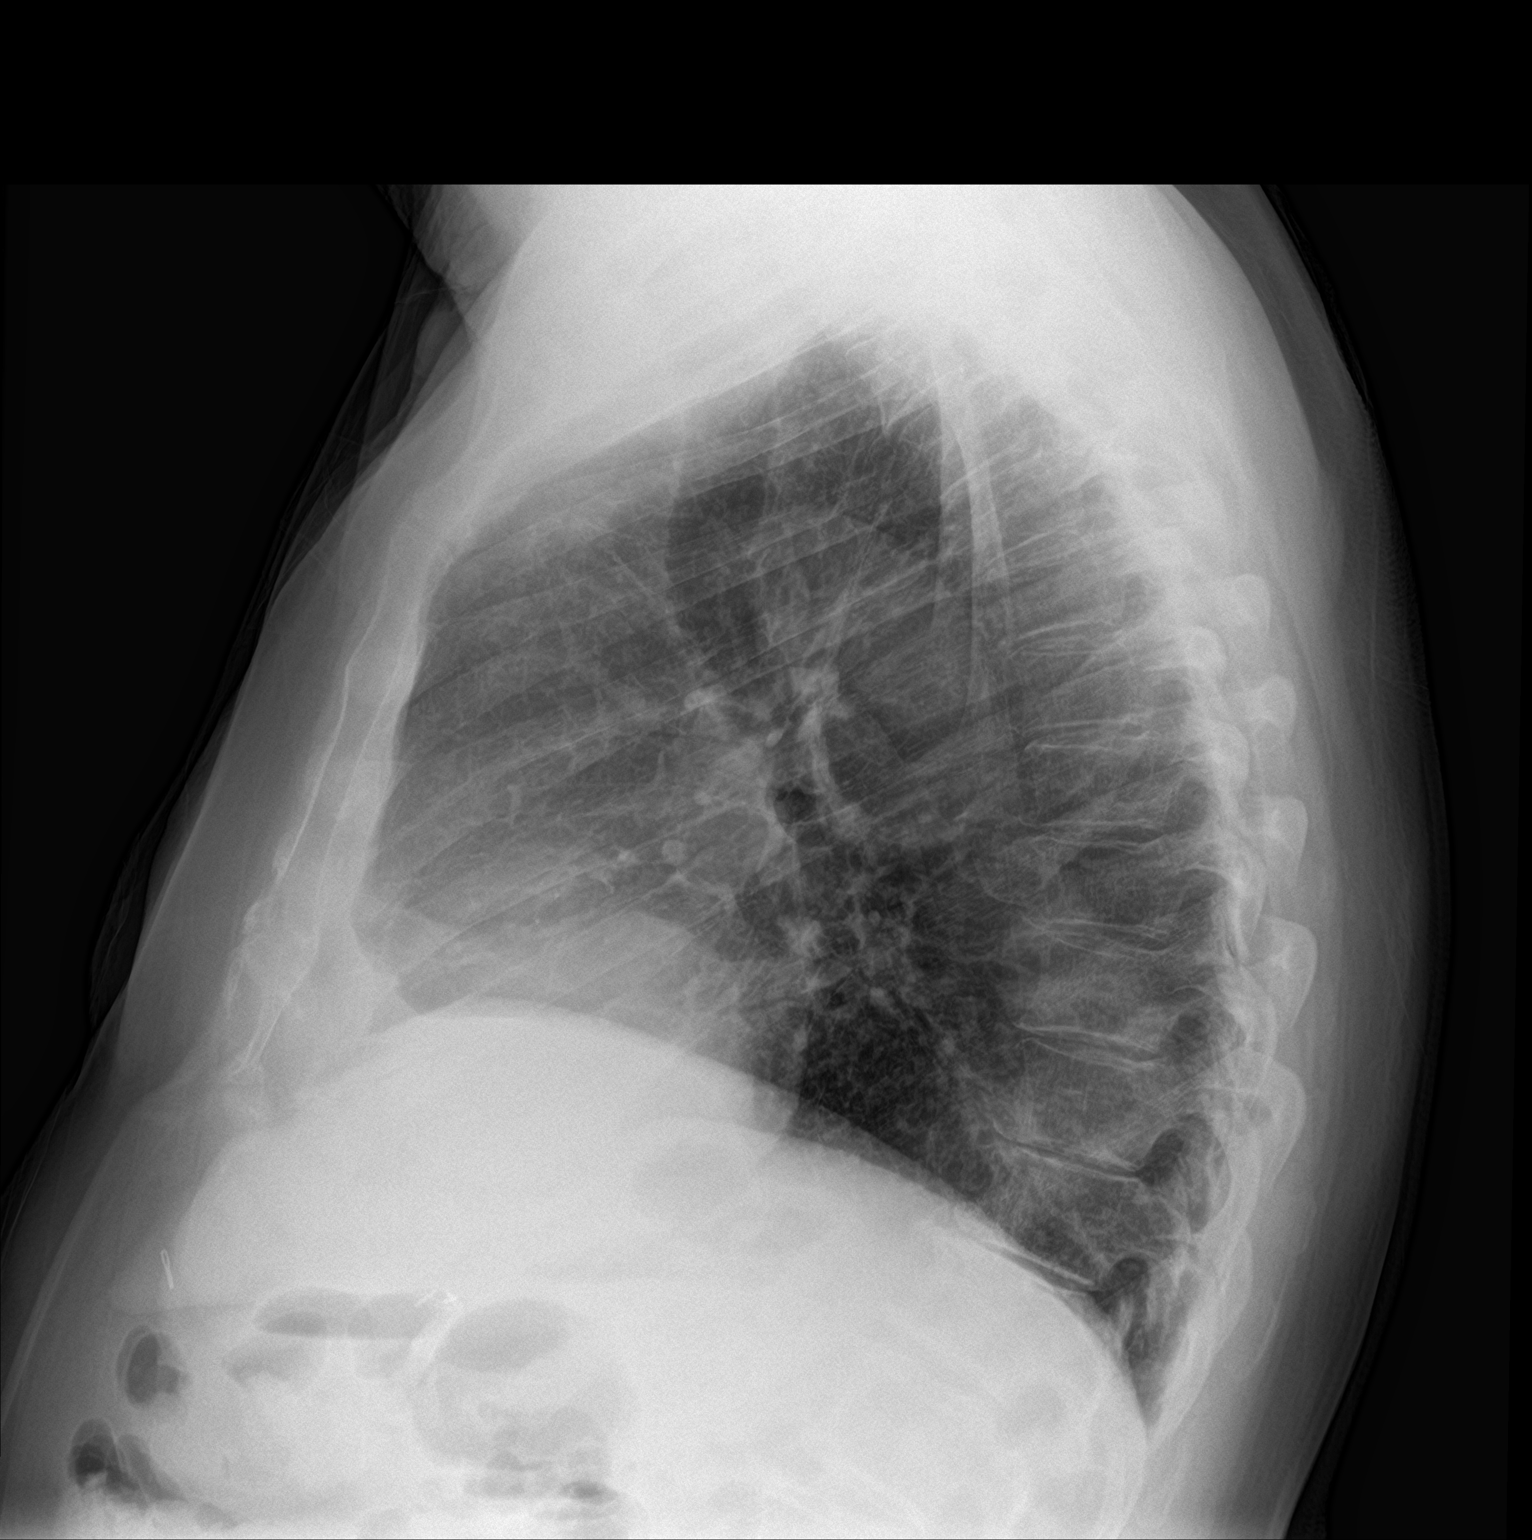

[2 of 2 positions shown; findings below may reference images not displayed]

FINDINGS: Stable heart size and mediastinal contours, aortic tortuosity. The
lungs are hyperinflated with emphysema no acute or focal airspace
disease, pleural effusion, or pneumothorax. No pulmonary edema. No
acute osseous abnormalities are seen.
IMPRESSION: Emphysema without superimposed acute process.

## 2022-05-08 NOTE — Therapy (Signed)
OUTPATIENT PHYSICAL THERAPY TREATMENT NOTE   Patient Name: Dylan Weber MRN: [id number] DOB:1933/01/16, 88 y.o., male Today's Date: 05/08/2022  PCP: Manson Allan, NP REFERRING PROVIDER: Vida Rigger MD   PT End of Session - 05/08/22 1256     Visit Number 13    Number of Visits 25    Date for PT Re-Evaluation 06/05/22    Authorization Time Period 03/13/2022-06/05/2022    Progress Note Due on Visit 20    PT Start Time 1257    PT Stop Time 1344    PT Time Calculation (min) 47 min    Equipment Utilized During Treatment Gait belt    Activity Tolerance Patient tolerated treatment well    Behavior During Therapy WFL for tasks assessed/performed             Past Medical History:  Diagnosis Date   COPD (chronic obstructive pulmonary disease) (HCC)    Coronary artery calcification seen on CT scan 11/17/2021   COVID-19 10/2021   Diastolic dysfunction    a. 11/2021 Echo: EF 55-60%, no rwma, GrI DD, nl RV fxn. Mild AI.   GERD (gastroesophageal reflux disease)    Pulmonary embolism (HCC)    a. 11/2021 CTA Chest: small peripheral PE in RLL branch of PA-->Eliquis.   Right leg DVT (HCC)    a. 11/2021 LE U/S: occlusive R calf vein DVT in R peroneal vein-->eliquis.   SVT (supraventricular tachycardia) (HCC)    a. Dx 10/2021 in setting of COVID infxn.   Past Surgical History:  Procedure Laterality Date   APPENDECTOMY     CHOLECYSTECTOMY     Patient Active Problem List   Diagnosis Date Noted   CAD (coronary artery disease) 12/31/2021   Near syncope 11/24/2021   Pulmonary embolism (HCC) 11/24/2021   Elevated troponin 11/24/2021   Hypoxia 11/01/2021   Acute on chronic respiratory failure with hypoxia (HCC)    SVT (supraventricular tachycardia) (HCC)    COPD exacerbation (HCC)    Gastroesophageal reflux disease without esophagitis    COVID-19 virus infection 10/31/2021    REFERRING DIAG: Muscle weakness (generalized)   THERAPY DIAG:  Difficulty in walking, not elsewhere  classified  Muscle weakness (generalized)  Other abnormalities of gait and mobility  Rationale for Evaluation and Treatment Rehabilitation  PERTINENT HISTORY: 88 year old male with referral for general muscle weakness. He has COPD and recent bout of COVID in Dec 2022 and reports progressive weakness since. He does endorse some ongoing right hip pain with recent injection with no improvement. Patient has 2L O2 as needed but states has not needed lately.   PRECAUTIONS: falls  SUBJECTIVE: Patient reports he has been sick with Bronchitis  PAIN:  Are you having pain? No     TODAY'S TREATMENT:  05/08/2022  Therex:   -Octane bike x L3 x 6 min- Continuously monitored patient for exercise effect: HR= 102 bpm; O2 sat 87 back up to 92 %)  -Step ups with 2.5 lb. AW x 10 reps each LE - Standing forward lunge squat onto bosu ball with 1 UE support x 12 reps each -Side step up/over green therapad x 15 reps alt LE-O2 sat =92% -Standing calf raises 2.5 lb AW (standing on edge of 1st step with BUE Support)  x 15 reps each.  -Seated LAQ with 2.5 lb. AW x 15 reps - VC for slow eccentric control -Seated hamstring with BTB x 12 reps BLE- Patient reports as hard. -Sit to stand: without UE Support x 5 with blue  airex pad And then another 5 reps from chair with no padding x 5 reps. O2 sat =95%  Patient ambulated 150 feet with 2.5 lb AW- Rated as hard Patient ambulated another 150 feet without AW - rated as medium.     Education provided throughout session via VC/TC and demonstration to facilitate movement at target joints and correct muscle activation for all testing and exercises performed.      PATIENT EDUCATION: Education details: Exercise Technique Person educated: Patient Education method: Explanation, Demonstration, Tactile cues, and Verbal cues Education comprehension: verbalized understanding, returned demonstration, verbal cues required, tactile cues required, and needs further  education   HOME EXERCISE PROGRAM: No changes this visit   PT Short Term Goals -      PT SHORT TERM GOAL #1   Title Pt will be independent with initial HEP in order to improve strength and balance in order to decrease fall risk and improve function at home and work    Baseline 03/13/2022= Patient presents with no formal HEP in place; 04/19/2022=Patient reports he is doing his bike riding and torso twist along with walking.    Time 6    Period Weeks    Status On-going    Target Date 04/24/22      PT SHORT TERM GOAL #2   Title Patient will be iindependent with knowledge and performance of pursed lip breathing for imroved recovery of breathing.    Baseline 03/13/2022- Patient with limited knowledge of how to recover from shortness of breath. 04/19/2022-Patient verbalized and demonstrates independence with pursed lip breathing technique.    Time 6    Period Weeks    Status Achieved    Target Date 04/24/22              PT Long Term Goals -      PT LONG TERM GOAL #1   Title Pt will be independent with Final HEP in order to improve strength and balance in order to decrease fall risk and improve function at home and work    Baseline 03/13/2022= Patient presents with no formal HEP in place    Time 12    Period Weeks    Status New    Target Date 06/05/22      PT LONG TERM GOAL #2   Title Pt will improve FOTO to target score of 66 to display perceived improvements in ability to complete ADL's.    Baseline 03/13/2022=57: 04/19/2022=66    Time 12    Period Weeks    Status On-going    Target Date 06/05/22      PT LONG TERM GOAL #3   Title Pt will decrease 5TSTS by at least 2 seconds inlcuding ability to perform with LE only in order to demonstrate clinically significant improvement in LE strength.    Baseline 03/13/2022= 15.12 sec with BUE Support; 04/19/2022=19.14 sec with hands on knees (minimal UE support).    Time 12    Period Weeks    Status On-going    Target Date 06/05/22      PT  LONG TERM GOAL #4   Title Pt will increase by at least 42m (195ft)- maintaining > 90% O2 sat in order to demonstrate clinically significant improvement in cardiopulmonary endurance and community ambulation    Baseline 03/13/2022= 970 feet using no AD; 04/19/2022= 1050 feet = 90%SpO2    Time 12    Period Weeks    Status On-going    Target Date 06/05/22  Plan - 05/08/22 2136     Clinical Impression Statement Patient presents with good motivation today. He presents with good participation and no cough or mucus today. He was able to follow all VC and improved with sit to stand performance today as seen by ability to stand well without UE support. Patient will continue to benefit from skilled physical therapy interventions in order to improve endurance, balance, in order to help him return to previous level of independence with community activities.    Personal Factors and Comorbidities Age;Comorbidity 2    Comorbidities COPD; HTN    Examination-Activity Limitations Bend;Carry;Lift;Squat;Other;Stand    Examination-Participation Restrictions Cleaning;Community Activity;Yard Work    Conservation officer, historic buildings Evolving/Moderate complexity    Rehab Potential Good    PT Frequency 2x / week    PT Duration 12 weeks    PT Treatment/Interventions ADLs/Self Care Home Management;Canalith Repostioning;Cryotherapy;Electrical Stimulation;Moist Heat;DME Instruction;Gait training;Stair training;Functional mobility training;Therapeutic activities;Therapeutic exercise;Balance training;Neuromuscular re-education;Patient/family education;Manual techniques;Passive range of motion;Dry needling;Vestibular    PT Next Visit Plan Review Pursed lip breathing and add hip strengthening, functional endurance based activities.    PT Home Exercise Plan Instructed in walking program using RPE scale and use of pulse ox    Consulted and Agree with Plan of Care Patient               Lenda Kelp, PT 05/08/2022, 9:39 PM

## 2022-05-10 ENCOUNTER — Ambulatory Visit: Payer: Medicare Other

## 2022-05-10 DIAGNOSIS — R262 Difficulty in walking, not elsewhere classified: Secondary | ICD-10-CM

## 2022-05-10 DIAGNOSIS — M6281 Muscle weakness (generalized): Secondary | ICD-10-CM | POA: Diagnosis not present

## 2022-05-10 DIAGNOSIS — R2689 Other abnormalities of gait and mobility: Secondary | ICD-10-CM

## 2022-05-10 NOTE — Therapy (Signed)
OUTPATIENT PHYSICAL THERAPY TREATMENT NOTE   Patient Name: Dylan Weber MRN: [id number] DOB:[birth year], 88 y.o., male Today's Date: 05/11/2022  PCP: Lang Snow, NP REFERRING PROVIDER: Ottie Glazier MD   PT End of Session - 05/11/22 2150     Visit Number 14    Number of Visits 25    Date for PT Re-Evaluation 06/05/22    Authorization Time Period 03/13/2022-06/05/2022    Progress Note Due on Visit 20    PT Start Time 1300    PT Stop Time 1343    PT Time Calculation (min) 43 min    Equipment Utilized During Treatment Gait belt    Activity Tolerance Patient tolerated treatment well    Behavior During Therapy WFL for tasks assessed/performed              Past Medical History:  Diagnosis Date   COPD (chronic obstructive pulmonary disease) ([place])    Coronary artery calcification seen on CT scan 11/17/2021   COVID-19 06/6577   Diastolic dysfunction    a. 11/2021 Echo: EF 55-60%, no rwma, GrI DD, nl RV fxn. Mild AI.   GERD (gastroesophageal reflux disease)    Pulmonary embolism ([place])    a. 11/2021 CTA Chest: small peripheral PE in RLL branch of PA-->Eliquis.   Right leg DVT ([place])    a. 11/2021 LE U/S: occlusive R calf vein DVT in R peroneal vein-->eliquis.   SVT (supraventricular tachycardia) ([place])    a. Dx 10/2021 in setting of COVID infxn.   Past Surgical History:  Procedure Laterality Date   APPENDECTOMY     CHOLECYSTECTOMY     Patient Active Problem List   Diagnosis Date Noted   CAD (coronary artery disease) 12/31/2021   Near syncope 11/24/2021   Pulmonary embolism ([place]) 11/24/2021   Elevated troponin 11/24/2021   Hypoxia 11/01/2021   Acute on chronic respiratory failure with hypoxia (HCC)    SVT (supraventricular tachycardia) (HCC)    COPD exacerbation (HCC)    Gastroesophageal reflux disease without esophagitis    COVID-19 virus infection 10/31/2021    REFERRING DIAG: Muscle weakness (generalized)   THERAPY DIAG:  Difficulty in walking, not elsewhere  classified  Muscle weakness (generalized)  Other abnormalities of gait and mobility  Rationale for Evaluation and Treatment Rehabilitation  PERTINENT HISTORY: 88 year old male with referral for general muscle weakness. He has COPD and recent bout of COVID in Dec 2022 and reports progressive weakness since. He does endorse some ongoing right hip pain with recent injection with no improvement. Patient has 2L O2 as needed but states has not needed lately.   PRECAUTIONS: falls  SUBJECTIVE: Patient reports he still has some mucus and went back to see his MD earlier and now on antibiotic   PAIN:  Are you having pain? No     TODAY'S TREATMENT:  05/10/2022  Therex:   -Nustep - LE only L0 x 1 min 30 sec L2 x 1 min 30 L4 x 2 min L0 x-2 Continuously monitored patient for exercise effect: HR= 102 bpm; O2 sat 98 %)  -Step ups with 3 lb. AW x 12 reps each LE - Standing static on  bosu ball without UE support x 30 sec x 2 trials -Side step up/over 1/2 foam with 3lb AW's x 12 reps each. O2 sat= 94% -Side step up using 3lb AWs onto 6" block then down off opp side then back up - 15 reps each alt LE-O2 sat =92%   -Forward/backward step up/over 1/2 foam  with 3lb AW x     12 Reps each. -Standing calf raises 3 lb AW (standing on edge of 1/2 foam with BUE Support)  x 15 reps each.  -Seated LAQ with 3 lb. AW x 15 reps - VC for slow eccentric control -Seated hamstring with BTB x 12 reps BLE- Patient reports as hard. -Sit to stand: without UE Support x 5 with blue airex pad And then another 5 reps from chair with no padding x 5 reps. O2 sat =95%  Patient ambulated 150 feet with 2.5 lb AW- Rated as hard Patient ambulated another 150 feet without AW - rated as medium.     Education provided throughout session via VC/TC and demonstration to facilitate movement at target joints and correct muscle activation for all testing and exercises performed.      PATIENT EDUCATION: Education details:  Exercise Technique Person educated: Patient Education method: Explanation, Demonstration, Tactile cues, and Verbal cues Education comprehension: verbalized understanding, returned demonstration, verbal cues required, tactile cues required, and needs further education   HOME EXERCISE PROGRAM: No changes this visit   PT Short Term Goals -      PT SHORT TERM GOAL #1   Title Pt will be independent with initial HEP in order to improve strength and balance in order to decrease fall risk and improve function at home and work    Baseline 03/13/2022= Patient presents with no formal HEP in place; 04/19/2022=Patient reports he is doing his bike riding and torso twist along with walking.    Time 6    Period Weeks    Status On-going    Target Date 04/24/22      PT SHORT TERM GOAL #2   Title Patient will be iindependent with knowledge and performance of pursed lip breathing for imroved recovery of breathing.    Baseline 03/13/2022- Patient with limited knowledge of how to recover from shortness of breath. 04/19/2022-Patient verbalized and demonstrates independence with pursed lip breathing technique.    Time 6    Period Weeks    Status Achieved    Target Date 04/24/22              PT Long Term Goals -      PT LONG TERM GOAL #1   Title Pt will be independent with Final HEP in order to improve strength and balance in order to decrease fall risk and improve function at home and work    Baseline 03/13/2022= Patient presents with no formal HEP in place    Time 12    Period Weeks    Status New    Target Date 06/05/22      PT LONG TERM GOAL #2   Title Pt will improve FOTO to target score of 66 to display perceived improvements in ability to complete ADL's.    Baseline 03/13/2022=57: 04/19/2022=66    Time 12    Period Weeks    Status On-going    Target Date 06/05/22      PT LONG TERM GOAL #3   Title Pt will decrease 5TSTS by at least 2 seconds inlcuding ability to perform with LE only in order to  demonstrate clinically significant improvement in LE strength.    Baseline 03/13/2022= 15.12 sec with BUE Support; 04/19/2022=19.14 sec with hands on knees (minimal UE support).    Time 12    Period Weeks    Status On-going    Target Date 06/05/22      PT LONG TERM GOAL #4  Title Pt will increase 6MWT by at least 37m(1643f- maintaining > 90% O2 sat in order to demonstrate clinically significant improvement in cardiopulmonary endurance and community ambulation    Baseline 03/13/2022= 970 feet using no AD; 04/19/2022= 1050 feet = 90%SpO2    Time 12    Period Weeks    Status On-going    Target Date 06/05/22              Plan -     Clinical Impression Statement Patient presents with improving LE strength as seen by increased resistance and increased reps. He was limited by fatigue again today but maintained > 90 % O2 sats throughout treatment with no coughing.  Patient will continue to benefit from skilled physical therapy interventions in order to improve endurance, balance, in order to help him return to previous level of independence with community activities.    Personal Factors and Comorbidities Age;Comorbidity 2    Comorbidities COPD; HTN    Examination-Activity Limitations Bend;Carry;Lift;Squat;Other;Stand    Examination-Participation Restrictions Cleaning;Community Activity;Yard Work    [employer] officervolving/Moderate complexity    Rehab Potential Good    PT Frequency 2x / week    PT Duration 12 weeks    PT Treatment/Interventions ADLs/Self Care Home Management;Canalith Repostioning;Cryotherapy;Electrical Stimulation;Moist Heat;DME Instruction;Gait training;Stair training;Functional mobility training;Therapeutic activities;Therapeutic exercise;Balance training;Neuromuscular re-education;Patient/family education;Manual techniques;Passive range of motion;Dry needling;Vestibular    PT Next Visit Plan Review Pursed lip breathing and add hip strengthening, functional  endurance based activities.    PT Home Exercise Plan Instructed in walking program using RPE scale and use of pulse ox    Consulted and Agree with Plan of Care Patient               JeLewis MoccasinPT 05/11/2022, 9:54 PM

## 2022-05-17 ENCOUNTER — Ambulatory Visit: Payer: Medicare Other | Attending: Pulmonary Disease

## 2022-05-17 DIAGNOSIS — R262 Difficulty in walking, not elsewhere classified: Secondary | ICD-10-CM | POA: Diagnosis not present

## 2022-05-17 DIAGNOSIS — R2689 Other abnormalities of gait and mobility: Secondary | ICD-10-CM | POA: Diagnosis present

## 2022-05-17 DIAGNOSIS — M6281 Muscle weakness (generalized): Secondary | ICD-10-CM | POA: Diagnosis present

## 2022-05-17 NOTE — Therapy (Signed)
OUTPATIENT PHYSICAL THERAPY TREATMENT NOTE   Patient Name: Dylan Weber MRN: [id number] DOB:[birth year], 88 y.o., male Today's Date: 05/18/2022  PCP: Lang Snow, NP REFERRING PROVIDER: Ottie Glazier MD   PT End of Session - 05/17/22 1555     Visit Number 15    Number of Visits 25    Date for PT Re-Evaluation 06/05/22    Authorization Time Period 03/13/2022-06/05/2022    Progress Note Due on Visit 41    PT Start Time 1556    PT Stop Time 1641    PT Time Calculation (min) 45 min    Equipment Utilized During Treatment Gait belt    Activity Tolerance Patient tolerated treatment well    Behavior During Therapy WFL for tasks assessed/performed              Past Medical History:  Diagnosis Date   COPD (chronic obstructive pulmonary disease) ([place])    Coronary artery calcification seen on CT scan 11/17/2021   COVID-19 32/9518   Diastolic dysfunction    a. 11/2021 Echo: EF 55-60%, no rwma, GrI DD, nl RV fxn. Mild AI.   GERD (gastroesophageal reflux disease)    Pulmonary embolism ([place])    a. 11/2021 CTA Chest: small peripheral PE in RLL branch of PA-->Eliquis.   Right leg DVT ([place])    a. 11/2021 LE U/S: occlusive R calf vein DVT in R peroneal vein-->eliquis.   SVT (supraventricular tachycardia) ([place])    a. Dx 10/2021 in setting of COVID infxn.   Past Surgical History:  Procedure Laterality Date   APPENDECTOMY     CHOLECYSTECTOMY     Patient Active Problem List   Diagnosis Date Noted   CAD (coronary artery disease) 12/31/2021   Near syncope 11/24/2021   Pulmonary embolism ([place]) 11/24/2021   Elevated troponin 11/24/2021   Hypoxia 11/01/2021   Acute on chronic respiratory failure with hypoxia (HCC)    SVT (supraventricular tachycardia) (HCC)    COPD exacerbation (HCC)    Gastroesophageal reflux disease without esophagitis    COVID-19 virus infection 10/31/2021    REFERRING DIAG: Muscle weakness (generalized)   THERAPY DIAG:  Difficulty in walking, not elsewhere  classified  Muscle weakness (generalized)  Other abnormalities of gait and mobility  Rationale for Evaluation and Treatment Rehabilitation  PERTINENT HISTORY: 88 year old male with referral for general muscle weakness. He has COPD and recent bout of COVID in Dec 2022 and reports progressive weakness since. He does endorse some ongoing right hip pain with recent injection with no improvement. Patient has 2L O2 as needed but states has not needed lately.   PRECAUTIONS: falls  SUBJECTIVE: Patient reports he still has some mucus and went back to see his MD earlier and now on antibiotic   PAIN:  Are you having pain? No     TODAY'S TREATMENT:  Therex:   Rd 1:  Sit to stand with min BUE support x 10 reps (increased difficulty towards last 3)  Standing push ups at support bar x 10 reps - 10 meter x 2 - variety- mix of high knee walk, butt kicks, Side shuffle (5 side steps then turn 180 and continue)   Rd 2:  Step up onto 6" block x 10 reps each LE Standing push up at support bar x 10 reps - 10 meter x 2 - variety- mix of high knee walk, butt kicks, Side shuffle (5 side steps then turn 180 and continue x 5 more steps)   Rd 3:  Lateral Side  step with squat alt LE x 10 reps each Standing push up at support bar x 10 reps - 10 meter x 2 - variety- mix of high knee walk, butt kicks, Side shuffle (5 side steps then turn 180 and continue x 5 more steps)    Neuromuscular re-ed:   Staggered stand (back foot on purple foam pad and front foot on 6" step  - Hold 20 sec x 3 trials each LE- No LOB but unsteady- ankle righting reaction   Tandem Stand- hold 2-14 sec at best.   Single Leg stance - Patient able to hold 2-7 sec at best x multiple attempts    Education provided throughout session via VC/TC and demonstration to facilitate movement at target joints and correct muscle activation for all testing and exercises performed.      PATIENT EDUCATION: Education details: Exercise  Technique Person educated: Patient Education method: Explanation, Demonstration, Tactile cues, and Verbal cues Education comprehension: verbalized understanding, returned demonstration, verbal cues required, tactile cues required, and needs further education   HOME EXERCISE PROGRAM: No changes this visit   PT Short Term Goals -      PT SHORT TERM GOAL #1   Title Pt will be independent with initial HEP in order to improve strength and balance in order to decrease fall risk and improve function at home and work    Baseline 03/13/2022= Patient presents with no formal HEP in place; 04/19/2022=Patient reports he is doing his bike riding and torso twist along with walking.    Time 6    Period Weeks    Status On-going    Target Date 04/24/22      PT SHORT TERM GOAL #2   Title Patient will be iindependent with knowledge and performance of pursed lip breathing for imroved recovery of breathing.    Baseline 03/13/2022- Patient with limited knowledge of how to recover from shortness of breath. 04/19/2022-Patient verbalized and demonstrates independence with pursed lip breathing technique.    Time 6    Period Weeks    Status Achieved    Target Date 04/24/22              PT Long Term Goals -      PT LONG TERM GOAL #1   Title Pt will be independent with Final HEP in order to improve strength and balance in order to decrease fall risk and improve function at home and work    Baseline 03/13/2022= Patient presents with no formal HEP in place    Time 12    Period Weeks    Status New    Target Date 06/05/22      PT LONG TERM GOAL #2   Title Pt will improve FOTO to target score of 66 to display perceived improvements in ability to complete ADL's.    Baseline 03/13/2022=57: 04/19/2022=66    Time 12    Period Weeks    Status On-going    Target Date 06/05/22      PT LONG TERM GOAL #3   Title Pt will decrease 5TSTS by at least 2 seconds inlcuding ability to perform with LE only in order to  demonstrate clinically significant improvement in LE strength.    Baseline 03/13/2022= 15.12 sec with BUE Support; 04/19/2022=19.14 sec with hands on knees (minimal UE support).    Time 12    Period Weeks    Status On-going    Target Date 06/05/22      PT LONG TERM GOAL #4  Title Pt will increase 6MWT by at least 61m(163f- maintaining > 90% O2 sat in order to demonstrate clinically significant improvement in cardiopulmonary endurance and community ambulation    Baseline 03/13/2022= 970 feet using no AD; 04/19/2022= 1050 feet = 90%SpO2    Time 12    Period Weeks    Status On-going    Target Date 06/05/22              Plan -     Clinical Impression Statement Patient continues to progress with LE strength and functional endurance. He was able to complete 3 rounds of therex and some balance activities - maintaining O2 sat > 90 and no significant difficulty. He endorsed some fatigue but able to continue with very brief rest breaks.  Patient will continue to benefit from skilled physical therapy interventions in order to improve endurance, balance, in order to help him return to previous level of independence with community activities.    Personal Factors and Comorbidities Age;Comorbidity 2    Comorbidities COPD; HTN    Examination-Activity Limitations Bend;Carry;Lift;Squat;Other;Stand    Examination-Participation Restrictions Cleaning;Community Activity;Yard Work    [employer] officervolving/Moderate complexity    Rehab Potential Good    PT Frequency 2x / week    PT Duration 12 weeks    PT Treatment/Interventions ADLs/Self Care Home Management;Canalith Repostioning;Cryotherapy;Electrical Stimulation;Moist Heat;DME Instruction;Gait training;Stair training;Functional mobility training;Therapeutic activities;Therapeutic exercise;Balance training;Neuromuscular re-education;Patient/family education;Manual techniques;Passive range of motion;Dry needling;Vestibular    PT Next Visit  Plan Review Pursed lip breathing and add hip strengthening, functional endurance based activities.    PT Home Exercise Plan Instructed in walking program using RPE scale and use of pulse ox    Consulted and Agree with Plan of Care Patient               JeLewis MoccasinPT 05/18/2022, 8:40 AM

## 2022-05-19 IMAGING — CT CT ANGIO CHEST
2 of 7 series · 18 of 46 positions shown · IV contrast (APPLIED)
Comparison: 11/17/2021

CLINICAL DATA: Evaluate for pulmonary embolism. Shortness of
breath.

EXAM:
CT ANGIOGRAPHY CHEST WITH CONTRAST
TECHNIQUE: Multidetector CT imaging of the chest was performed using the
standard protocol during bolus administration of intravenous
contrast. Multiplanar CT image reconstructions and MIPs were
obtained to evaluate the vascular anatomy.

[Series 5: thins · axial · 0.78mm/px · z∈[-798,-557]mm · 15 of 336 slices shown]
[im 17/336  lung]
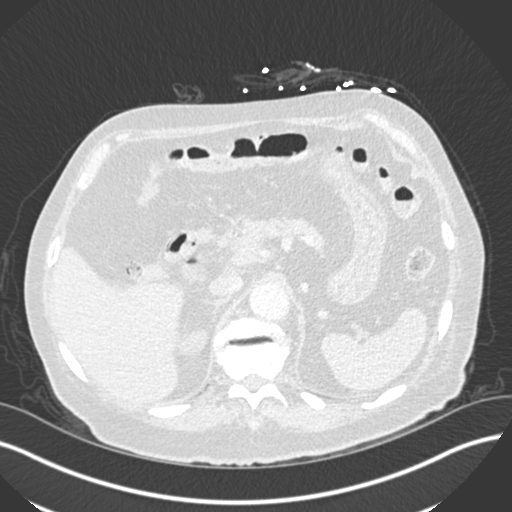
[im 34/336  soft-tissue]
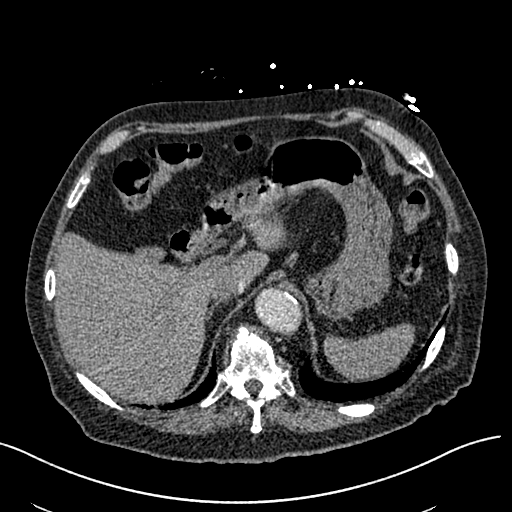
[im 68/336  lung]
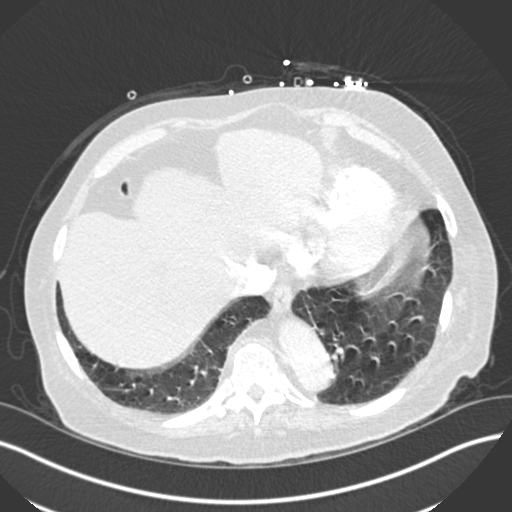
[im 84/336  soft-tissue]
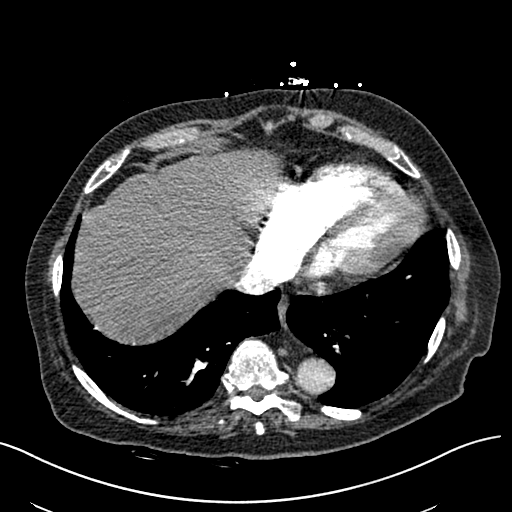
[im 101/336  lung]
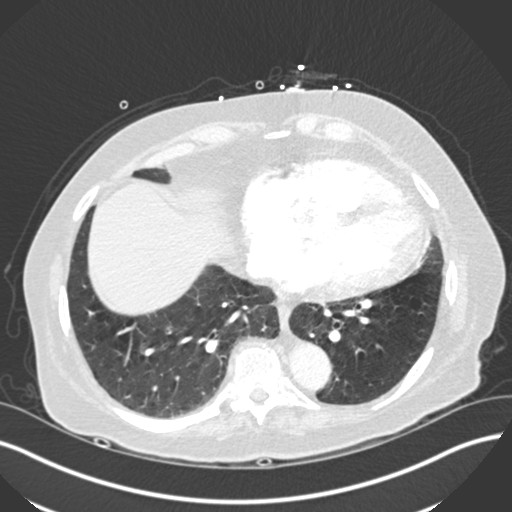
[im 118/336  soft-tissue]
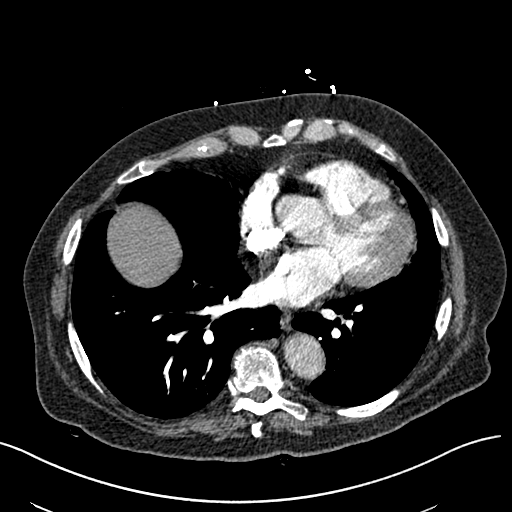
[im 151/336  lung]
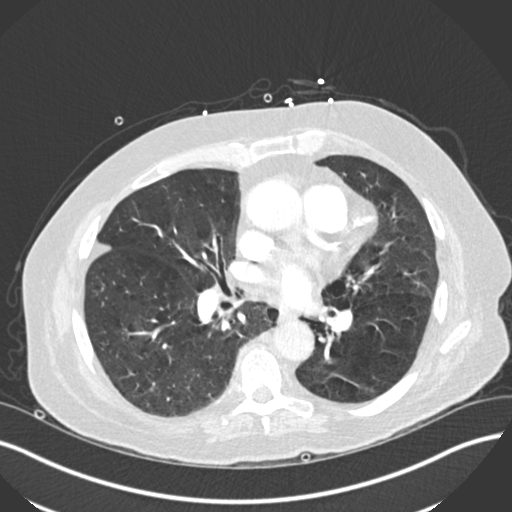
[im 168/336  soft-tissue]
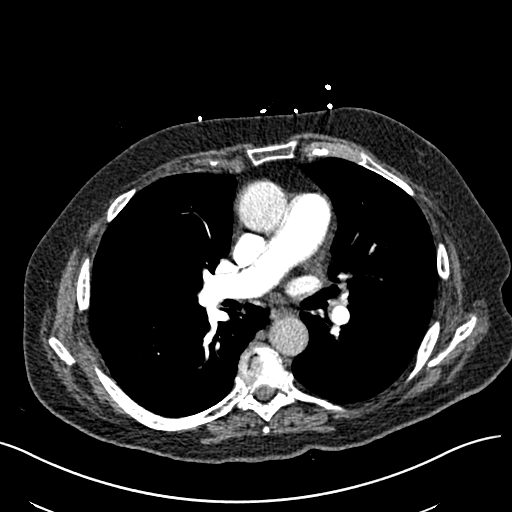
[im 185/336  lung]
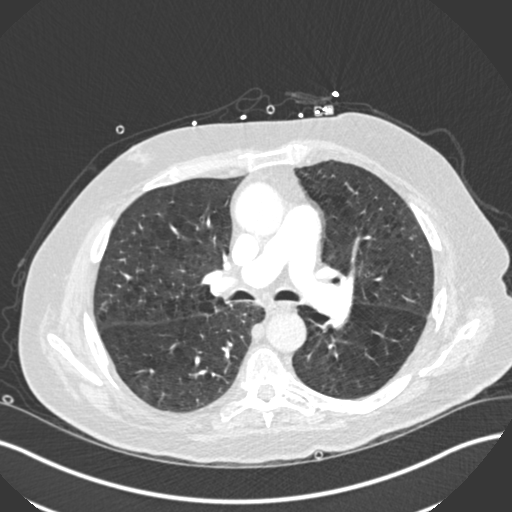
[im 218/336  soft-tissue]
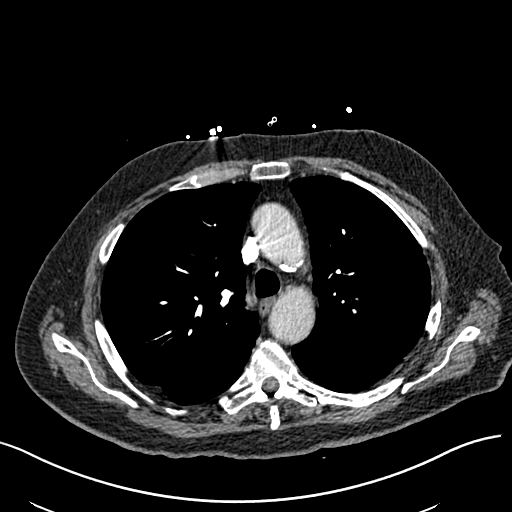
[im 235/336  lung]
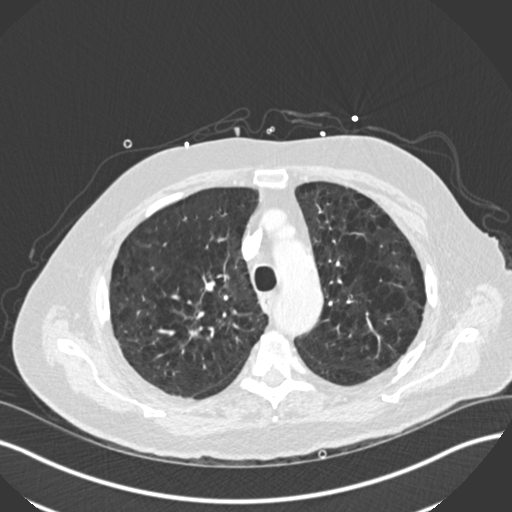
[im 252/336  soft-tissue]
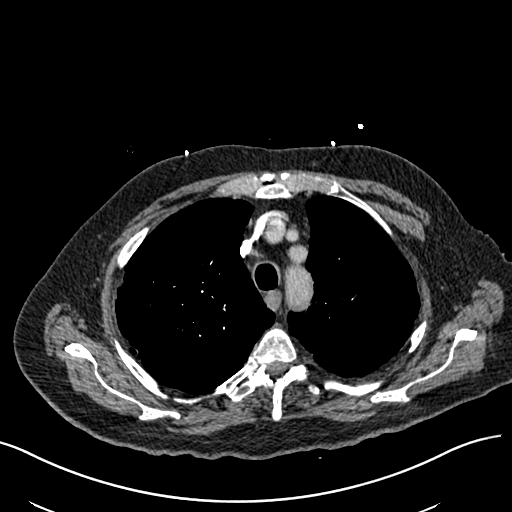
[im 269/336  lung]
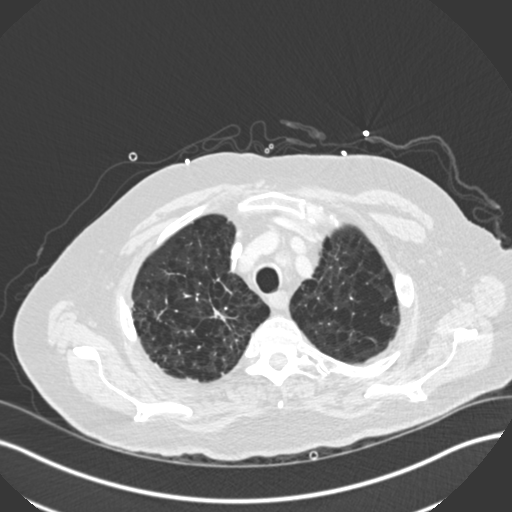
[im 302/336  soft-tissue]
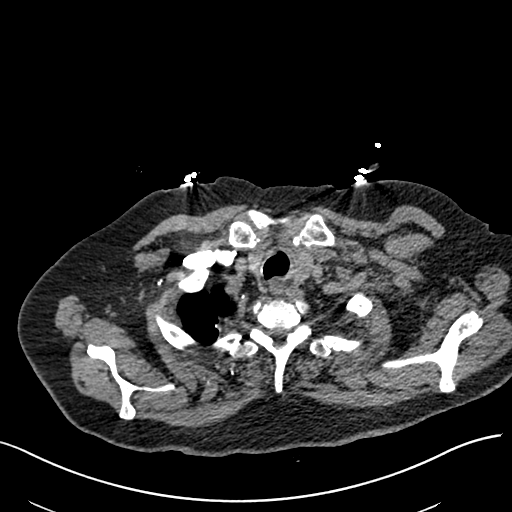
[im 319/336  lung]
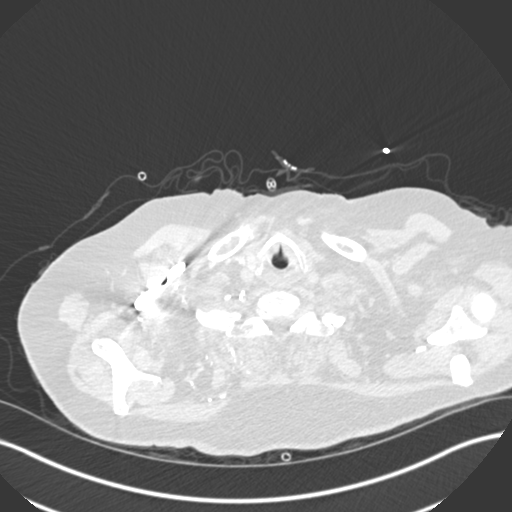

[Series 7: coronal mpr · coronal · 0.58mm/px · 3 of 106 slices shown]
[im 27/106  soft-tissue]
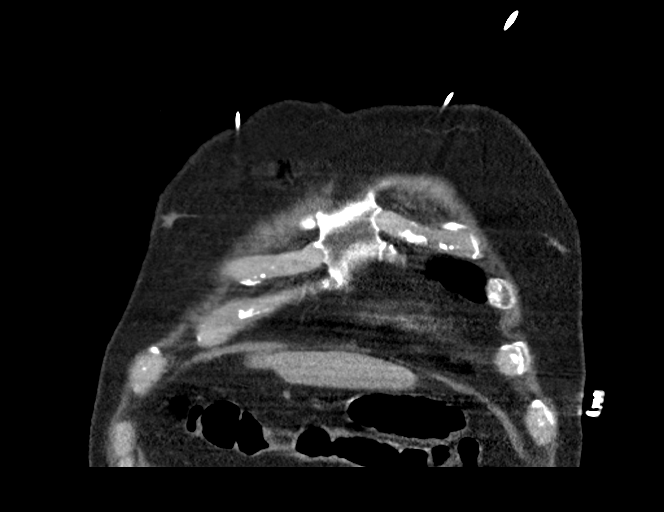
[im 53/106  soft-tissue]
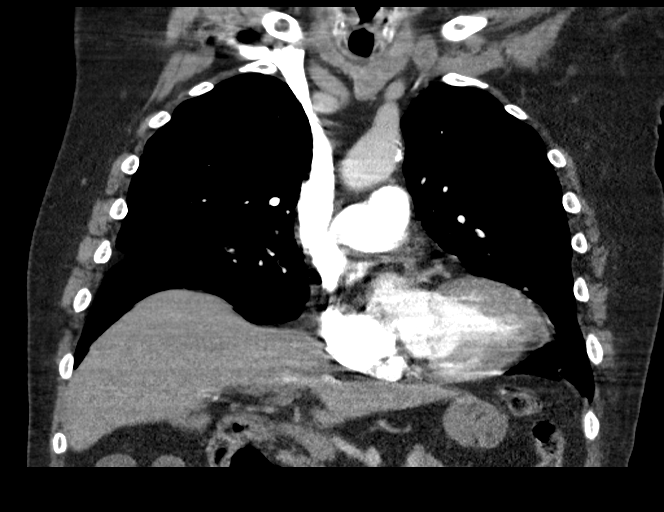
[im 79/106  soft-tissue]
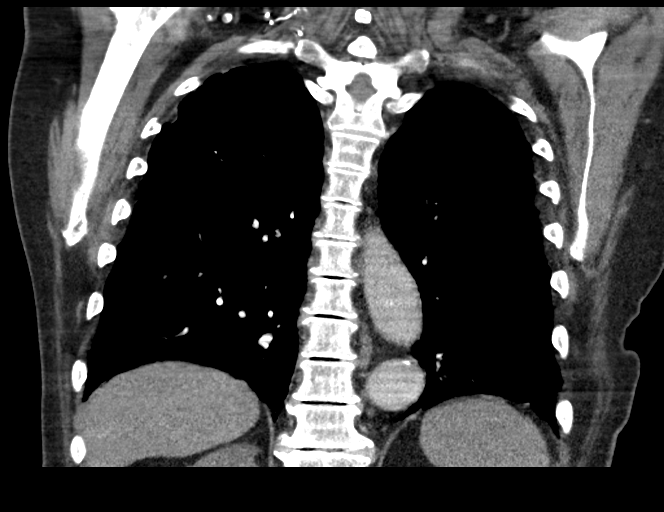

[18 of 46 positions shown; findings below may reference images not displayed]

RADIATION DOSE REDUCTION: This exam was performed according to the
departmental dose-optimization program which includes automated
exposure control, adjustment of the mA and/or kV according to
patient size and/or use of iterative reconstruction technique.

CONTRAST:  75mL OMNIPAQUE IOHEXOL 350 MG/ML SOLN
FINDINGS: Cardiovascular: Satisfactory opacification of the pulmonary arteries
to the segmental level. No evidence of acute pulmonary embolism.
Partial recanalization of the previous segmental embolus to the
posterior right lung base, image 250/5.

Aortic atherosclerosis. Coronary artery calcification. Normal heart
size. No pericardial effusion.

Mediastinum/Nodes: No enlarged mediastinal, hilar, or axillary lymph
nodes. Thyroid gland, trachea, and esophagus demonstrate no
significant findings.

Lungs/Pleura: No pleural effusion, airspace consolidation,
atelectasis, or pneumothorax. Moderate centrilobular and paraseptal
emphysema. Similar appearance right apical scarring.

Upper Abdomen: No acute abnormality.  Previous cholecystectomy.

Musculoskeletal: Degenerative disc disease noted with thoracic
spine. No acute or suspicious osseous findings.

Review of the MIP images confirms the above findings.
IMPRESSION: 1. No evidence for acute pulmonary embolism.
2. Coronary artery calcifications noted.
3. Aortic Atherosclerosis ([id number]-[id number].L) and Emphysema ([id number]).

## 2022-05-23 ENCOUNTER — Ambulatory Visit: Payer: Medicare Other

## 2022-05-28 NOTE — Therapy (Signed)
OUTPATIENT PHYSICAL THERAPY TREATMENT NOTE   Patient Name: Dylan Weber MRN: [id number] DOB:1932/11/14, 88 y.o., male Today's Date: 05/29/2022  PCP: Lang Snow, NP REFERRING PROVIDER: Ottie Glazier MD   PT End of Session - 05/29/22 1249     Visit Number 16    Number of Visits 25    Date for PT Re-Evaluation 06/05/22    Authorization Time Period 03/13/2022-06/05/2022    Progress Note Due on Visit 20    PT Start Time 1300    PT Stop Time 1344    PT Time Calculation (min) 44 min    Equipment Utilized During Treatment Gait belt    Activity Tolerance Patient tolerated treatment well    Behavior During Therapy WFL for tasks assessed/performed               Past Medical History:  Diagnosis Date   COPD (chronic obstructive pulmonary disease) ([place])    Coronary artery calcification seen on CT scan 11/17/2021   COVID-19 44/3154   Diastolic dysfunction    a. 11/2021 Echo: EF 55-60%, no rwma, GrI DD, nl RV fxn. Mild AI.   GERD (gastroesophageal reflux disease)    Pulmonary embolism ([place])    a. 11/2021 CTA Chest: small peripheral PE in RLL branch of PA-->Eliquis.   Right leg DVT ([place])    a. 11/2021 LE U/S: occlusive R calf vein DVT in R peroneal vein-->eliquis.   SVT (supraventricular tachycardia) ([place])    a. Dx 10/2021 in setting of COVID infxn.   Past Surgical History:  Procedure Laterality Date   APPENDECTOMY     CHOLECYSTECTOMY     Patient Active Problem List   Diagnosis Date Noted   CAD (coronary artery disease) 12/31/2021   Near syncope 11/24/2021   Pulmonary embolism ([place]) 11/24/2021   Elevated troponin 11/24/2021   Hypoxia 11/01/2021   Acute on chronic respiratory failure with hypoxia (H[facility])    SVT (supraventricular tachycardia) (H[facility])    COPD exacerbation ([facility])    Gastroesophageal reflux disease without esophagitis    COVID-19 virus infection 10/31/2021    REFERRING DIAG: Muscle weakness (generalized)   THERAPY DIAG:  Difficulty in walking, not elsewhere  classified  Muscle weakness (generalized)  Other abnormalities of gait and mobility  Rationale for Evaluation and Treatment Rehabilitation  PERTINENT HISTORY: 88 year old male with referral for general muscle weakness. He has COPD and recent bout of COVID in Dec 2022 and reports progressive weakness since. He does endorse some ongoing right hip pain with recent injection with no improvement. Patient has 2L O2 as needed but states has not needed lately.   PRECAUTIONS: falls  SUBJECTIVE:Patient reports no falls or LOB since last session. Reports he is feeling better.   PAIN:  Are you having pain? No     TODAY'S TREATMENT:  Therex:   Round 1:  x2 trials  Sit to stand with min BUE support x 10 reps (increased difficulty towards last 3) Sp02 92% Standing push ups at support bar x 10 reps   Round 2: x3 trials  Step up onto 12" block x 10 reps each LE; second set with toe taps rather than step ups due to challenge with LLE. : sp02 drop to 91% - 10 meter x 2 - variety- mix of high knee walk, butt kicks, Side shuffle (5 side steps then turn 180 and continue x 5 more steps)   Seated  LAQ with ball adduction 10x 3 second holds       Neuromuscular re-ed:  Airex pad: -RTB row 15x -RTB straight arm lat pull down 15   -weighted bar (5lb): chest press 15x, straight arm raise 10x, bicep curl 20x   Walk with ball toss 2x 86 ft ; close CGA   Education provided throughout session via VC/TC and demonstration to facilitate movement at target joints and correct muscle activation for all testing and exercises performed.      PATIENT EDUCATION: Education details: Exercise Technique Person educated: Patient Education method: Explanation, Demonstration, Tactile cues, and Verbal cues Education comprehension: verbalized understanding, returned demonstration, verbal cues required, tactile cues required, and needs further education   HOME EXERCISE PROGRAM: No changes this visit   PT  Short Term Goals -      PT SHORT TERM GOAL #1   Title Pt will be independent with initial HEP in order to improve strength and balance in order to decrease fall risk and improve function at home and work    Baseline 03/13/2022= Patient presents with no formal HEP in place; 04/19/2022=Patient reports he is doing his bike riding and torso twist along with walking.    Time 6    Period Weeks    Status On-going    Target Date 04/24/22      PT SHORT TERM GOAL #2   Title Patient will be iindependent with knowledge and performance of pursed lip breathing for imroved recovery of breathing.    Baseline 03/13/2022- Patient with limited knowledge of how to recover from shortness of breath. 04/19/2022-Patient verbalized and demonstrates independence with pursed lip breathing technique.    Time 6    Period Weeks    Status Achieved    Target Date 04/24/22              PT Long Term Goals -      PT LONG TERM GOAL #1   Title Pt will be independent with Final HEP in order to improve strength and balance in order to decrease fall risk and improve function at home and work    Baseline 03/13/2022= Patient presents with no formal HEP in place    Time 12    Period Weeks    Status New    Target Date 06/05/22      PT LONG TERM GOAL #2   Title Pt will improve FOTO to target score of 66 to display perceived improvements in ability to complete ADL's.    Baseline 03/13/2022=57: 04/19/2022=66    Time 12    Period Weeks    Status On-going    Target Date 06/05/22      PT LONG TERM GOAL #3   Title Pt will decrease 5TSTS by at least 2 seconds inlcuding ability to perform with LE only in order to demonstrate clinically significant improvement in LE strength.    Baseline 03/13/2022= 15.12 sec with BUE Support; 04/19/2022=19.14 sec with hands on knees (minimal UE support).    Time 12    Period Weeks    Status On-going    Target Date 06/05/22      PT LONG TERM GOAL #4   Title Pt will increase 6MWT by at least 76m (165f- maintaining > 90% O2 sat in order to demonstrate clinically significant improvement in cardiopulmonary endurance and community ambulation    Baseline 03/13/2022= 970 feet using no AD; 04/19/2022= 1050 feet = 90%SpO2    Time 12    Period Weeks    Status On-going    Target Date 06/05/22  Plan -     Clinical Impression Statement Patient is challenged with LLE step negotiation due to weakness. Patient is highly motivated throughout session. Patient requires monitoring gof oxygen throughout session due Sp02 drop . Patient will continue to benefit from skilled physical therapy interventions in order to improve endurance, balance, in order to help him return to previous level of independence with community activities.    Personal Factors and Comorbidities Age;Comorbidity 2    Comorbidities COPD; HTN    Examination-Activity Limitations Bend;Carry;Lift;Squat;Other;Stand    Examination-Participation Restrictions Cleaning;Community Activity;Yard Work    [employer] Evolving/Moderate complexity    Rehab Potential Good    PT Frequency 2x / week    PT Duration 12 weeks    PT Treatment/Interventions ADLs/Self Care Home Management;Canalith Repostioning;Cryotherapy;Electrical Stimulation;Moist Heat;DME Instruction;Gait training;Stair training;Functional mobility training;Therapeutic activities;Therapeutic exercise;Balance training;Neuromuscular re-education;Patient/family education;Manual techniques;Passive range of motion;Dry needling;Vestibular    PT Next Visit Plan Review Pursed lip breathing and add hip strengthening, functional endurance based activities.    PT Home Exercise Plan Instructed in walking program using RPE scale and use of pulse ox    Consulted and Agree with Plan of Care Patient               Janna Arch, PT 05/29/2022, 1:46 PM

## 2022-05-29 ENCOUNTER — Ambulatory Visit: Payer: Medicare Other

## 2022-05-29 DIAGNOSIS — R262 Difficulty in walking, not elsewhere classified: Secondary | ICD-10-CM

## 2022-05-29 DIAGNOSIS — M6281 Muscle weakness (generalized): Secondary | ICD-10-CM

## 2022-05-29 DIAGNOSIS — R2689 Other abnormalities of gait and mobility: Secondary | ICD-10-CM

## 2022-05-30 NOTE — Therapy (Signed)
OUTPATIENT PHYSICAL THERAPY TREATMENT NOTE   Patient Name: Dylan Weber MRN: [id number] DOB:1933-01-21, 88 y.o., male Today's Date: 05/31/2022  PCP: Lang Snow, NP REFERRING PROVIDER: Ottie Glazier MD   PT End of Session - 05/31/22 1252     Visit Number 17    Number of Visits 25    Date for PT Re-Evaluation 06/05/22    Authorization Time Period 03/13/2022-06/05/2022    Progress Note Due on Visit 20    PT Start Time 1300    PT Stop Time 1344    PT Time Calculation (min) 44 min    Equipment Utilized During Treatment Gait belt    Activity Tolerance Patient tolerated treatment well    Behavior During Therapy WFL for tasks assessed/performed                Past Medical History:  Diagnosis Date   COPD (chronic obstructive pulmonary disease) ([place])    Coronary artery calcification seen on CT scan 11/17/2021   COVID-19 76/2831   Diastolic dysfunction    a. 11/2021 Echo: EF 55-60%, no rwma, GrI DD, nl RV fxn. Mild AI.   GERD (gastroesophageal reflux disease)    Pulmonary embolism ([place])    a. 11/2021 CTA Chest: small peripheral PE in RLL branch of PA-->Eliquis.   Right leg DVT ([place])    a. 11/2021 LE U/S: occlusive R calf vein DVT in R peroneal vein-->eliquis.   SVT (supraventricular tachycardia) ([place])    a. Dx 10/2021 in setting of COVID infxn.   Past Surgical History:  Procedure Laterality Date   APPENDECTOMY     CHOLECYSTECTOMY     Patient Active Problem List   Diagnosis Date Noted   CAD (coronary artery disease) 12/31/2021   Near syncope 11/24/2021   Pulmonary embolism ([place]) 11/24/2021   Elevated troponin 11/24/2021   Hypoxia 11/01/2021   Acute on chronic respiratory failure with hypoxia ([facility])    SVT (supraventricular tachycardia) ([facility])    COPD exacerbation ([facility])    Gastroesophageal reflux disease without esophagitis    COVID-19 virus infection 10/31/2021    REFERRING DIAG: Muscle weakness (generalized)   THERAPY DIAG:  Difficulty in walking, not  elsewhere classified  Muscle weakness (generalized)  Other abnormalities of gait and mobility  Rationale for Evaluation and Treatment Rehabilitation  PERTINENT HISTORY: 88 year old male with referral for general muscle weakness. He has COPD and recent bout of COVID in Dec 2022 and reports progressive weakness since. He does endorse some ongoing right hip pain with recent injection with no improvement. Patient has 2L O2 as needed but states has not needed lately.   PRECAUTIONS: falls  SUBJECTIVE:Patient went to physician yesterday about his breathing.   PAIN:  Are you having pain? No     TODAY'S TREATMENT:  BP: 115/85  Therex:   Round 1:  x2 trials  Sit to stand with min BUE support x 10 reps (increased difficulty towards last 3) Sp02 92% Ambulate 160 ft with CGA   Seated 6" step toe taps 30 seconds     Neuromuscular re-ed:  Resisted ambulation; matrix #12.5; 2x each direction: L, R, forward/backwards, backwards/forwards; significant L foot drag with lateral stepping.   Airex pad: -RTB row 15x -RTB straight arm lat pull down 15   -weighted bar (5lb): chest press 15x, straight arm raise 10x, bicep curl 20x   ambulate in hallway: -horizontal head turns with cues for reading alphabet from cards 86 ftx 2 sets -horizontal head turns with cues for reading number  and symbols from cards 86 ft x 2 sets  -"red light/green light" for sudden initiation/termination of ambulation with close CGA for carryover to natural environment 2x 86 ft     Education provided throughout session via VC/TC and demonstration to facilitate movement at target joints and correct muscle activation for all testing and exercises performed.      PATIENT EDUCATION: Education details: Exercise Technique Person educated: Patient Education method: Explanation, Demonstration, Tactile cues, and Verbal cues Education comprehension: verbalized understanding, returned demonstration, verbal cues required,  tactile cues required, and needs further education   HOME EXERCISE PROGRAM: No changes this visit   PT Short Term Goals -      PT SHORT TERM GOAL #1   Title Pt will be independent with initial HEP in order to improve strength and balance in order to decrease fall risk and improve function at home and work    Baseline 03/13/2022= Patient presents with no formal HEP in place; 04/19/2022=Patient reports he is doing his bike riding and torso twist along with walking.    Time 6    Period Weeks    Status On-going    Target Date 04/24/22      PT SHORT TERM GOAL #2   Title Patient will be iindependent with knowledge and performance of pursed lip breathing for imroved recovery of breathing.    Baseline 03/13/2022- Patient with limited knowledge of how to recover from shortness of breath. 04/19/2022-Patient verbalized and demonstrates independence with pursed lip breathing technique.    Time 6    Period Weeks    Status Achieved    Target Date 04/24/22              PT Long Term Goals -      PT LONG TERM GOAL #1   Title Pt will be independent with Final HEP in order to improve strength and balance in order to decrease fall risk and improve function at home and work    Baseline 03/13/2022= Patient presents with no formal HEP in place    Time 12    Period Weeks    Status New    Target Date 06/05/22      PT LONG TERM GOAL #2   Title Pt will improve FOTO to target score of 66 to display perceived improvements in ability to complete ADL's.    Baseline 03/13/2022=57: 04/19/2022=66    Time 12    Period Weeks    Status On-going    Target Date 06/05/22      PT LONG TERM GOAL #3   Title Pt will decrease 5TSTS by at least 2 seconds inlcuding ability to perform with LE only in order to demonstrate clinically significant improvement in LE strength.    Baseline 03/13/2022= 15.12 sec with BUE Support; 04/19/2022=19.14 sec with hands on knees (minimal UE support).    Time 12    Period Weeks    Status  On-going    Target Date 06/05/22      PT LONG TERM GOAL #4   Title Pt will increase 6MWT by at least 41m(1648f- maintaining > 90% O2 sat in order to demonstrate clinically significant improvement in cardiopulmonary endurance and community ambulation    Baseline 03/13/2022= 970 feet using no AD; 04/19/2022= 1050 feet = 90%SpO2    Time 12    Period Weeks    Status On-going    Target Date 06/05/22              Plan -  Clinical Impression Statement Patient is more challenged with sit to stands this session requiring hands on knees. He remains highly motivated throughout session despite need for intermittent rest. He is challenged with left foot drag with side stepping.  Patient will continue to benefit from skilled physical therapy interventions in order to improve endurance, balance, in order to help him return to previous level of independence with community activities.    Personal Factors and Comorbidities Age;Comorbidity 2    Comorbidities COPD; HTN    Examination-Activity Limitations Bend;Carry;Lift;Squat;Other;Stand    Examination-Participation Restrictions Cleaning;Community Activity;Yard Work    [employer] officer Evolving/Moderate complexity    Rehab Potential Good    PT Frequency 2x / week    PT Duration 12 weeks    PT Treatment/Interventions ADLs/Self Care Home Management;Canalith Repostioning;Cryotherapy;Electrical Stimulation;Moist Heat;DME Instruction;Gait training;Stair training;Functional mobility training;Therapeutic activities;Therapeutic exercise;Balance training;Neuromuscular re-education;Patient/family education;Manual techniques;Passive range of motion;Dry needling;Vestibular    PT Next Visit Plan Review Pursed lip breathing and add hip strengthening, functional endurance based activities.    PT Home Exercise Plan Instructed in walking program using RPE scale and use of pulse ox    Consulted and Agree with Plan of Care Patient                Janna Arch, PT 05/31/2022, 1:45 PM

## 2022-05-31 ENCOUNTER — Ambulatory Visit: Payer: Medicare Other

## 2022-05-31 DIAGNOSIS — R2689 Other abnormalities of gait and mobility: Secondary | ICD-10-CM

## 2022-05-31 DIAGNOSIS — R262 Difficulty in walking, not elsewhere classified: Secondary | ICD-10-CM

## 2022-05-31 DIAGNOSIS — M6281 Muscle weakness (generalized): Secondary | ICD-10-CM

## 2022-06-01 ENCOUNTER — Other Ambulatory Visit: Payer: Self-pay | Admitting: Pulmonary Disease

## 2022-06-01 DIAGNOSIS — J189 Pneumonia, unspecified organism: Secondary | ICD-10-CM

## 2022-06-04 ENCOUNTER — Ambulatory Visit: Payer: Medicare Other

## 2022-06-05 NOTE — Therapy (Signed)
OUTPATIENT PHYSICAL THERAPY TREATMENT NOTE/RECERT   Patient Name: Dylan Weber MRN: [id number] DOB:[birth year], 88 y.o., male Today's Date: 06/06/2022  PCP: Lang Snow, NP REFERRING PROVIDER: Ottie Glazier MD   PT End of Session - 06/06/22 1319     Visit Number 18    Number of Visits 30    Date for PT Re-Evaluation 08/29/22    Authorization Time Period 03/13/2022-06/05/2022    Progress Note Due on Visit 20    PT Start Time 1300    PT Stop Time 1344    PT Time Calculation (min) 44 min    Equipment Utilized During Treatment Gait belt    Activity Tolerance Patient tolerated treatment well    Behavior During Therapy WFL for tasks assessed/performed                 Past Medical History:  Diagnosis Date   COPD (chronic obstructive pulmonary disease) ([place])    Coronary artery calcification seen on CT scan 11/17/2021   COVID-19 68/1157   Diastolic dysfunction    a. 11/2021 Echo: EF 55-60%, no rwma, GrI DD, nl RV fxn. Mild AI.   GERD (gastroesophageal reflux disease)    Pulmonary embolism ([place])    a. 11/2021 CTA Chest: small peripheral PE in RLL branch of PA-->Eliquis.   Right leg DVT ([place])    a. 11/2021 LE U/S: occlusive R calf vein DVT in R peroneal vein-->eliquis.   SVT (supraventricular tachycardia) ([place])    a. Dx 10/2021 in setting of COVID infxn.   Past Surgical History:  Procedure Laterality Date   APPENDECTOMY     CHOLECYSTECTOMY     Patient Active Problem List   Diagnosis Date Noted   CAD (coronary artery disease) 12/31/2021   Near syncope 11/24/2021   Pulmonary embolism ([place]) 11/24/2021   Elevated troponin 11/24/2021   Hypoxia 11/01/2021   Acute on chronic respiratory failure with hypoxia ([facility])    SVT (supraventricular tachycardia) ([facility])    COPD exacerbation ([facility])    Gastroesophageal reflux disease without esophagitis    COVID-19 virus infection 10/31/2021    REFERRING DIAG: Muscle weakness (generalized)   THERAPY DIAG:  Difficulty in walking,  not elsewhere classified  Muscle weakness (generalized)  Other abnormalities of gait and mobility  Rationale for Evaluation and Treatment Rehabilitation  PERTINENT HISTORY: 88 year old male with referral for general muscle weakness. He has COPD and recent bout of COVID in Dec 2022 and reports progressive weakness since. He does endorse some ongoing right hip pain with recent injection with no improvement. Patient has 2L O2 as needed but states has not needed lately.   PRECAUTIONS: falls  SUBJECTIVE:Patient missed Monday due to having a dental procedure due to an abscessed tooth. Is still taking antibiotics. Patient reports therapy has been helping him.   PAIN:  Are you having pain? No     TODAY'S TREATMENT:  RECERT: HEP  FOTO: 26%   5x STS: 15.7 seconds hands on knees 6 min walk test : 990 ft with SP02 91%   BP: 112/99  New goal: 13/30   HEP education and performance      Education provided throughout session via VC/TC and demonstration to facilitate movement at target joints and correct muscle activation for all testing and exercises performed.      PATIENT EDUCATION: Education details: Exercise Technique Person educated: Patient Education method: Explanation, Demonstration, Tactile cues, and Verbal cues Education comprehension: verbalized understanding, returned demonstration, verbal cues required, tactile cues required, and needs further education  HOME EXERCISE PROGRAM:   Access Code: [email or web address] URL: [email or web address] Date: 06/06/2022 Prepared by: Janna Arch  Program Notes **Be sure to perform all exercises next to a countertop or sturdy piece of furniture in case you become unsteady.  Exercises - Standing Heel Raise with Support  - 1 x daily - 7 x weekly - 2 sets - 15 reps - Standing Toe Raises at Chair  - 1 x daily - 7 x weekly - 2 sets - 15 reps - Mini Squat with Counter Support  - 1 x daily - 7 x weekly - 2 sets - 15 reps -  Standing Tandem Balance with Counter Support  - 1 x daily - 7 x weekly - 2 sets - 2 reps - 30 hold - Standing Single Leg Stance with Counter Support  - 1 x daily - 7 x weekly - 2 sets - 2 reps - 30 hold   PT Short Term Goals -      PT SHORT TERM GOAL #1   Title Pt will be independent with initial HEP in order to improve strength and balance in order to decrease fall risk and improve function at home and work    Baseline 03/13/2022= Patient presents with no formal HEP in place; 04/19/2022=Patient reports he is doing his bike riding and torso twist along with walking.  7/26: compliant   Time 6    Period Weeks    Status On-going    Target Date MET     PT SHORT TERM GOAL #2   Title Patient will be iindependent with knowledge and performance of pursed lip breathing for imroved recovery of breathing.    Baseline 03/13/2022- Patient with limited knowledge of how to recover from shortness of breath. 04/19/2022-Patient verbalized and demonstrates independence with pursed lip breathing technique.    Time 6    Period Weeks    Status Achieved    Target Date 04/24/22              PT Long Term Goals -      PT LONG TERM GOAL #1   Title Pt will be independent with Final HEP in order to improve strength and balance in order to decrease fall risk and improve function at home and work    Baseline 03/13/2022= Patient presents with no formal HEP in place 7/26: HEP compliant   Time 12    Period Weeks    Status Achieved   Target Date 06/05/22      PT LONG TERM GOAL #2   Title Pt will improve FOTO to target score of 66 to display perceived improvements in ability to complete ADL's.    Baseline 03/13/2022=57: 04/19/2022=66 7/26:80%    Time 12    Period Weeks    Status MET    Target Date 06/05/22      PT LONG TERM GOAL #3   Title Pt will decrease 5TSTS by at least 2 seconds inlcuding ability to perform with LE only in order to demonstrate clinically significant improvement in LE strength.    Baseline  03/13/2022= 15.12 sec with BUE Support; 04/19/2022=19.14 sec with hands on knees (minimal UE support).  7/26: 15.7 seconds with hands on knees   Time 12    Period Weeks    Status On-going    Target Date 08/29/2022       PT LONG TERM GOAL #4   Title Pt will increase 6MWT by at least 20m(1657f- maintaining > 90% O2 sat in order to  demonstrate clinically significant improvement in cardiopulmonary endurance and community ambulation    Baseline 03/13/2022= 970 feet using no AD; 04/19/2022= 1050 feet = 90%SpO2  7/26: 990 ft with SP02 90%   Time 12    Period Weeks    Status On-going    Target Date 08/29/2022          PT LONG TERM GOAL #  Title  Patient will increase Functional Gait Assessment score to >20/30 as to reduce fall risk and improve dynamic gait safety with community ambulation.  Baseline 7/26: 13/30  Time 12   Period Weeks   Status New  Target Date 08/29/2022            Plan -     Clinical Impression Statement Patient's goals assessed today. He is limited due to recent dental procedure and abscess in mouth. Patient agreeable for one more recert of 1x/week for focus on stability with ambulation for decreased fall risk. FGA added to goal POC. New HEP provided with patient demonstrating understanding.  Patient will continue to benefit from skilled physical therapy interventions in order to improve endurance, balance, in order to help him return to previous level of independence with community activities.    Personal Factors and Comorbidities Age;Comorbidity 2    Comorbidities COPD; HTN    Examination-Activity Limitations Bend;Carry;Lift;Squat;Other;Stand    Examination-Participation Restrictions Cleaning;Community Activity;Yard Work    [employer] officer Evolving/Moderate complexity    Rehab Potential Good    PT Frequency 2x / week    PT Duration 12 weeks    PT Treatment/Interventions ADLs/Self Care Home Management;Canalith Repostioning;Cryotherapy;Electrical  Stimulation;Moist Heat;DME Instruction;Gait training;Stair training;Functional mobility training;Therapeutic activities;Therapeutic exercise;Balance training;Neuromuscular re-education;Patient/family education;Manual techniques;Passive range of motion;Dry needling;Vestibular    PT Next Visit Plan Review Pursed lip breathing and add hip strengthening, functional endurance based activities.    PT Home Exercise Plan Instructed in walking program using RPE scale and use of pulse ox    Consulted and Agree with Plan of Care Patient               Janna Arch, PT 06/06/2022, 2:18 PM

## 2022-06-06 ENCOUNTER — Ambulatory Visit: Payer: Medicare Other

## 2022-06-06 DIAGNOSIS — R2689 Other abnormalities of gait and mobility: Secondary | ICD-10-CM

## 2022-06-06 DIAGNOSIS — R262 Difficulty in walking, not elsewhere classified: Secondary | ICD-10-CM

## 2022-06-06 DIAGNOSIS — M6281 Muscle weakness (generalized): Secondary | ICD-10-CM

## 2022-06-11 NOTE — Therapy (Signed)
OUTPATIENT PHYSICAL THERAPY TREATMENT NOTE   Patient Name: Dylan Weber MRN: [id number] DOB:1933/02/26, 88 y.o., male Today's Date: 06/12/2022  PCP: Lang Snow, NP REFERRING PROVIDER: Ottie Glazier MD   PT End of Session - 06/12/22 1255     Visit Number 19    Number of Visits 30    Date for PT Re-Evaluation 08/29/22    Authorization Time Period 03/13/2022-06/05/2022    Progress Note Due on Visit 20    PT Start Time 1300    PT Stop Time 1344    PT Time Calculation (min) 44 min    Equipment Utilized During Treatment Gait belt    Activity Tolerance Patient tolerated treatment well    Behavior During Therapy WFL for tasks assessed/performed                  Past Medical History:  Diagnosis Date   COPD (chronic obstructive pulmonary disease) ([place])    Coronary artery calcification seen on CT scan 11/17/2021   COVID-19 94/7654   Diastolic dysfunction    a. 11/2021 Echo: EF 55-60%, no rwma, GrI DD, nl RV fxn. Mild AI.   GERD (gastroesophageal reflux disease)    Pulmonary embolism ([place])    a. 11/2021 CTA Chest: small peripheral PE in RLL branch of PA-->Eliquis.   Right leg DVT ([place])    a. 11/2021 LE U/S: occlusive R calf vein DVT in R peroneal vein-->eliquis.   SVT (supraventricular tachycardia) ([place])    a. Dx 10/2021 in setting of COVID infxn.   Past Surgical History:  Procedure Laterality Date   APPENDECTOMY     CHOLECYSTECTOMY     Patient Active Problem List   Diagnosis Date Noted   CAD (coronary artery disease) 12/31/2021   Near syncope 11/24/2021   Pulmonary embolism ([place]) 11/24/2021   Elevated troponin 11/24/2021   Hypoxia 11/01/2021   Acute on chronic respiratory failure with hypoxia ([facility])    SVT (supraventricular tachycardia) ([facility])    COPD exacerbation ([facility])    Gastroesophageal reflux disease without esophagitis    COVID-19 virus infection 10/31/2021    REFERRING DIAG: Muscle weakness (generalized)   THERAPY DIAG:  Difficulty in walking, not  elsewhere classified  Muscle weakness (generalized)  Other abnormalities of gait and mobility  Rationale for Evaluation and Treatment Rehabilitation  PERTINENT HISTORY: 88 year old male with referral for general muscle weakness. He has COPD and recent bout of COVID in Dec 2022 and reports progressive weakness since. He does endorse some ongoing right hip pain with recent injection with no improvement. Patient has 2L O2 as needed but states has not needed lately.   PRECAUTIONS: falls  SUBJECTIVE:Patient reports compliance with HEP. Is doing better with his balance but still an area of improvement.   PAIN:  Are you having pain? No     TODAY'S TREATMENT:   BP: 115/85  Therex:   Nustep Lvl 4 seat position 8 4 minutes; cues for keeping SPM>70   Sit to stand with min BUE support x 10 reps (increased difficulty towards last 3) Sp02 92%  Hip flexion into GTB across bars 15x each LE    Neuromuscular re-ed:  Airex balance beam: -lateral stepping 6x length of // bars with finger tip support  -tandem walking 4x length of // bars                          -weighted bar (5lb): chest press 15x, straight arm raise 10x, bicep curl  20x    -tandem stance 30 seconds each LE position   ambulate in hallway: -vertical ball toss with CGA x 2 trials -ball bounce with CGA x2 trials          Education provided throughout session via VC/TC and demonstration to facilitate movement at target joints and correct muscle activation for all testing and exercises performed.      PATIENT EDUCATION: Education details: Exercise Technique Person educated: Patient Education method: Explanation, Demonstration, Tactile cues, and Verbal cues Education comprehension: verbalized understanding, returned demonstration, verbal cues required, tactile cues required, and needs further education   HOME EXERCISE PROGRAM:   Access Code: [email or web address] URL: [email or web address] Date: 06/06/2022 Prepared  by: Janna Arch  Program Notes **Be sure to perform all exercises next to a countertop or sturdy piece of furniture in case you become unsteady.  Exercises - Standing Heel Raise with Support  - 1 x daily - 7 x weekly - 2 sets - 15 reps - Standing Toe Raises at Chair  - 1 x daily - 7 x weekly - 2 sets - 15 reps - Mini Squat with Counter Support  - 1 x daily - 7 x weekly - 2 sets - 15 reps - Standing Tandem Balance with Counter Support  - 1 x daily - 7 x weekly - 2 sets - 2 reps - 30 hold - Standing Single Leg Stance with Counter Support  - 1 x daily - 7 x weekly - 2 sets - 2 reps - 30 hold   PT Short Term Goals -      PT SHORT TERM GOAL #1   Title Pt will be independent with initial HEP in order to improve strength and balance in order to decrease fall risk and improve function at home and work    Baseline 03/13/2022= Patient presents with no formal HEP in place; 04/19/2022=Patient reports he is doing his bike riding and torso twist along with walking.  7/26: compliant   Time 6    Period Weeks    Status On-going    Target Date MET     PT SHORT TERM GOAL #2   Title Patient will be iindependent with knowledge and performance of pursed lip breathing for imroved recovery of breathing.    Baseline 03/13/2022- Patient with limited knowledge of how to recover from shortness of breath. 04/19/2022-Patient verbalized and demonstrates independence with pursed lip breathing technique.    Time 6    Period Weeks    Status Achieved    Target Date 04/24/22              PT Long Term Goals -      PT LONG TERM GOAL #1   Title Pt will be independent with Final HEP in order to improve strength and balance in order to decrease fall risk and improve function at home and work    Baseline 03/13/2022= Patient presents with no formal HEP in place 7/26: HEP compliant   Time 12    Period Weeks    Status Achieved   Target Date 06/05/22      PT LONG TERM GOAL #2   Title Pt will improve FOTO to target score  of 66 to display perceived improvements in ability to complete ADL's.    Baseline 03/13/2022=57: 04/19/2022=66 7/26:80%    Time 12    Period Weeks    Status MET    Target Date 06/05/22      PT LONG TERM GOAL #3   Title  Pt will decrease 5TSTS by at least 2 seconds inlcuding ability to perform with LE only in order to demonstrate clinically significant improvement in LE strength.    Baseline 03/13/2022= 15.12 sec with BUE Support; 04/19/2022=19.14 sec with hands on knees (minimal UE support).  7/26: 15.7 seconds with hands on knees   Time 12    Period Weeks    Status On-going    Target Date 08/29/2022       PT LONG TERM GOAL #4   Title Pt will increase 6MWT by at least 42m(1637f- maintaining > 90% O2 sat in order to demonstrate clinically significant improvement in cardiopulmonary endurance and community ambulation    Baseline 03/13/2022= 970 feet using no AD; 04/19/2022= 1050 feet = 90%SpO2  7/26: 990 ft with SP02 90%   Time 12    Period Weeks    Status On-going    Target Date 08/29/2022          PT LONG TERM GOAL #  Title  Patient will increase Functional Gait Assessment score to >20/30 as to reduce fall risk and improve dynamic gait safety with community ambulation.  Baseline 7/26: 13/30  Time 12   Period Weeks   Status New  Target Date 08/29/2022            Plan -     Clinical Impression Statement Patient tolerates progressive stability interventions well however does require oxygen monitoring due to SP02 dropping below 90%. Patient does fatigue with prolonged activity requiring rest breaks.  Patient will continue to benefit from skilled physical therapy interventions in order to improve endurance, balance, in order to help him return to previous level of independence with community activities.    Personal Factors and Comorbidities Age;Comorbidity 2    Comorbidities COPD; HTN    Examination-Activity Limitations Bend;Carry;Lift;Squat;Other;Stand    Examination-Participation  Restrictions Cleaning;Community Activity;Yard Work    [employer] officervolving/Moderate complexity    Rehab Potential Good    PT Frequency 2x / week    PT Duration 12 weeks    PT Treatment/Interventions ADLs/Self Care Home Management;Canalith Repostioning;Cryotherapy;Electrical Stimulation;Moist Heat;DME Instruction;Gait training;Stair training;Functional mobility training;Therapeutic activities;Therapeutic exercise;Balance training;Neuromuscular re-education;Patient/family education;Manual techniques;Passive range of motion;Dry needling;Vestibular    PT Next Visit Plan Review Pursed lip breathing and add hip strengthening, functional endurance based activities.    PT Home Exercise Plan Instructed in walking program using RPE scale and use of pulse ox    Consulted and Agree with Plan of Care Patient               MaJanna ArchPT 06/12/2022, 1:51 PM

## 2022-06-12 ENCOUNTER — Ambulatory Visit: Payer: Medicare Other | Attending: Pulmonary Disease

## 2022-06-12 DIAGNOSIS — R262 Difficulty in walking, not elsewhere classified: Secondary | ICD-10-CM | POA: Insufficient documentation

## 2022-06-12 DIAGNOSIS — R2689 Other abnormalities of gait and mobility: Secondary | ICD-10-CM | POA: Insufficient documentation

## 2022-06-12 DIAGNOSIS — M6281 Muscle weakness (generalized): Secondary | ICD-10-CM | POA: Insufficient documentation

## 2022-06-13 ENCOUNTER — Ambulatory Visit
Admission: RE | Admit: 2022-06-13 | Discharge: 2022-06-13 | Disposition: A | Discharge status: Home / Self Care | Payer: Medicare Other | Source: Ambulatory Visit | Attending: Pulmonary Disease | Admitting: Pulmonary Disease

## 2022-06-13 DIAGNOSIS — J189 Pneumonia, unspecified organism: Secondary | ICD-10-CM

## 2022-06-14 ENCOUNTER — Ambulatory Visit: Payer: Medicare Other

## 2022-06-19 ENCOUNTER — Ambulatory Visit: Payer: Medicare Other

## 2022-06-19 ENCOUNTER — Encounter: Payer: Self-pay | Admitting: Dermatology

## 2022-06-19 ENCOUNTER — Ambulatory Visit: Payer: Medicare Other | Admitting: Dermatology

## 2022-06-19 DIAGNOSIS — D692 Other nonthrombocytopenic purpura: Secondary | ICD-10-CM

## 2022-06-19 DIAGNOSIS — R262 Difficulty in walking, not elsewhere classified: Secondary | ICD-10-CM | POA: Diagnosis not present

## 2022-06-19 DIAGNOSIS — M6281 Muscle weakness (generalized): Secondary | ICD-10-CM

## 2022-06-19 DIAGNOSIS — B353 Tinea pedis: Secondary | ICD-10-CM | POA: Diagnosis not present

## 2022-06-19 DIAGNOSIS — L570 Actinic keratosis: Secondary | ICD-10-CM | POA: Diagnosis not present

## 2022-06-19 DIAGNOSIS — D492 Neoplasm of unspecified behavior of bone, soft tissue, and skin: Secondary | ICD-10-CM | POA: Diagnosis not present

## 2022-06-19 DIAGNOSIS — L905 Scar conditions and fibrosis of skin: Secondary | ICD-10-CM

## 2022-06-19 DIAGNOSIS — L821 Other seborrheic keratosis: Secondary | ICD-10-CM

## 2022-06-19 DIAGNOSIS — R2689 Other abnormalities of gait and mobility: Secondary | ICD-10-CM

## 2022-06-19 DIAGNOSIS — D099 Carcinoma in situ, unspecified: Secondary | ICD-10-CM

## 2022-06-19 DIAGNOSIS — L578 Other skin changes due to chronic exposure to nonionizing radiation: Secondary | ICD-10-CM

## 2022-06-19 DIAGNOSIS — C4491 Basal cell carcinoma of skin, unspecified: Secondary | ICD-10-CM

## 2022-06-19 HISTORY — DX: Basal cell carcinoma of skin, unspecified: C44.91

## 2022-06-19 HISTORY — DX: Carcinoma in situ, unspecified: D09.9

## 2022-06-19 MED ORDER — CICLOPIROX OLAMINE 0.77 % EX CREA: TOPICAL_CREAM | CUTANEOUS | 2 refills | Status: DC

## 2022-06-19 NOTE — Progress Notes (Signed)
Follow-Up Visit   Subjective  Dylan Weber is a 88 y.o. male who presents for the following: lesion (Scalp. Was treated at last visit. Has come back and is larger. Other areas on scalp and face. Raised, rough. Hx of COVID 7 months ago) and Eczema (Foot. Previous dermatologist in [place] prescribed a cream, cannot remember name of cream).  The patient has spots, moles and lesions to be evaluated, some may be new or changing and the patient has concerns that these could be cancer.  Wife contributes to history and discussion of future care plans  The following portions of the chart were reviewed this encounter and updated as appropriate:  Tobacco  Allergies  Meds  Problems  Med Hx  Surg Hx  Fam Hx      Review of Systems: No other skin or systemic complaints except as noted in HPI or Assessment and Plan.   Objective  Well appearing patient in no apparent distress; mood and affect are within normal limits.  A focused examination was performed including head, including the scalp, face, neck, nose, ears, eyelids, and lips. Relevant physical exam findings are noted in the Assessment and Plan.  Right Occipital Scalp 1.9 cm scaly pink plaque with cutaneous horn     Dorsum of Nose 0.6 cm pink pearly papule     Scalp x2, right temple x2, right antehelix x1, right helix x2, left triangular fossa x1 (8) Erythematous thin papules/macules with gritty scale.   Left Foot Scaling and maceration web spaces and over distal and lateral soles.    Assessment & Plan   Seborrheic Keratoses - Stuck-on, waxy, tan-brown papules and/or plaques  - Benign-appearing - Discussed benign etiology and prognosis. - Observe - Call for any changes  Actinic Damage - chronic, secondary to cumulative UV radiation exposure/sun exposure over time - diffuse scaly erythematous macules with underlying dyspigmentation - Recommend daily broad spectrum sunscreen SPF 30+ to sun-exposed areas, reapply  every 2 hours as needed.  - Recommend staying in the shade or wearing long sleeves, sun glasses (UVA+UVB protection) and wide brim hats (4-inch brim around the entire circumference of the hat). - Call for new or changing lesions.  Purpura - Chronic; persistent and recurrent.  Treatable, but not curable. - Violaceous macules and patches - Benign - Related to trauma, age, sun damage and/or use of blood thinners, chronic use of topical and/or oral steroids - Observe - Can use OTC arnica containing moisturizer such as Dermend Bruise Formula if desired - Call for worsening or other concerns   Neoplasm of skin (2) Right Occipital Scalp  Skin / nail biopsy Type of biopsy: tangential   Informed consent: discussed and consent obtained   Anesthesia: the lesion was anesthetized in a standard fashion   Anesthesia comment:  Area prepped with alcohol Anesthetic:  1% lidocaine w/ epinephrine 1-100,000 buffered w/ 8.4% NaHCO3 Instrument used: flexible razor blade   Hemostasis achieved with: pressure, aluminum chloride and electrodesiccation   Outcome: patient tolerated procedure well   Post-procedure details: wound care instructions given   Post-procedure details comment:  Ointment and small bandage applied  Specimen 1 - Surgical pathology Differential Diagnosis: R/O SCC  Check Margins: No  Dorsum of Nose  Skin / nail biopsy Type of biopsy: tangential   Informed consent: discussed and consent obtained   Anesthesia: the lesion was anesthetized in a standard fashion   Anesthesia comment:  Area prepped with alcohol Anesthetic:  1% lidocaine w/ epinephrine 1-100,000 buffered w/ 8.4% NaHCO3 Instrument  used: flexible razor blade   Hemostasis achieved with: pressure, aluminum chloride and electrodesiccation   Outcome: patient tolerated procedure well   Post-procedure details: wound care instructions given   Post-procedure details comment:  Ointment and small bandage applied  Specimen 2 -  Surgical pathology Differential Diagnosis: R/O BCC  Check Margins: No  Discussed options of Mohs surgery, radiation for nose with patient and wife.  AK (actinic keratosis) (8) Scalp x2, right temple x2, right antehelix x1, right helix x2, left triangular fossa x1  Actinic keratoses are precancerous spots that appear secondary to cumulative UV radiation exposure/sun exposure over time. They are chronic with expected duration over 1 year. A portion of actinic keratoses will progress to squamous cell carcinoma of the skin. It is not possible to reliably predict which spots will progress to skin cancer and so treatment is recommended to prevent development of skin cancer.  Recommend daily broad spectrum sunscreen SPF 30+ to sun-exposed areas, reapply every 2 hours as needed.  Recommend staying in the shade or wearing long sleeves, sun glasses (UVA+UVB protection) and wide brim hats (4-inch brim around the entire circumference of the hat). Call for new or changing lesions.  Prior to procedure, discussed risks of blister formation, small wound, skin dyspigmentation, or rare scar following cryotherapy. Recommend Vaseline ointment to treated areas while healing.   Destruction of lesion - Scalp x2, right temple x2, right antehelix x1, right helix x2, left triangular fossa x1  Destruction method: cryotherapy   Informed consent: discussed and consent obtained   Lesion destroyed using liquid nitrogen: Yes   Outcome: patient tolerated procedure well with no complications   Post-procedure details: wound care instructions given    Scar Left Lower Leg - Anterior  Benign-appearing.  Observation.  Call clinic for new or changing lesions.  Recommend daily use of broad spectrum spf 30+ sunscreen to sun-exposed areas.    Tinea pedis of left foot Left Foot  With onychomycosis  Start Ciclopirox twice daily until clear then once weekly for prevention  ciclopirox (LOPROX) 0.77 % cream - Left  Foot Apply twice daily to feet until clear then once weekly for prevention.   Return for TBSE 2-3 months.  I, Emelia Salisbury, CMA, am acting as scribe for Forest Gleason, MD.  Documentation: I have reviewed the above documentation for accuracy and completeness, and I agree with the above.  Forest Gleason, MD

## 2022-06-19 NOTE — Therapy (Incomplete)
OUTPATIENT PHYSICAL THERAPY TREATMENT NOTE/Physical Therapy Progress Note   Dates of reporting period  04/19/2022   to  06/19/2022   Patient Name: Dylan Weber MRN: [id number] DOB:01/27/1933, 88 y.o., male Today's Date: 06/20/2022  PCP: Lang Snow, NP REFERRING PROVIDER: Ottie Glazier MD   PT End of Session - 06/19/22 1307     Number of Visits 30    Date for PT Re-Evaluation 08/29/22    Authorization Time Period 03/13/2022-06/05/2022; 06/06/2022- 08/29/2022    Progress Note Due on Visit 20    PT Start Time 1303    PT Stop Time 1344    PT Time Calculation (min) 41 min    Equipment Utilized During Treatment Gait belt    Activity Tolerance Patient tolerated treatment well    Behavior During Therapy WFL for tasks assessed/performed                  Past Medical History:  Diagnosis Date   COPD (chronic obstructive pulmonary disease) ([place])    Coronary artery calcification seen on CT scan 11/17/2021   COVID-19 25/0539   Diastolic dysfunction    a. 11/2021 Echo: EF 55-60%, no rwma, GrI DD, nl RV fxn. Mild AI.   GERD (gastroesophageal reflux disease)    Pulmonary embolism ([place])    a. 11/2021 CTA Chest: small peripheral PE in RLL branch of PA-->Eliquis.   Right leg DVT ([place])    a. 11/2021 LE U/S: occlusive R calf vein DVT in R peroneal vein-->eliquis.   SVT (supraventricular tachycardia) ([place])    a. Dx 10/2021 in setting of COVID infxn.   Past Surgical History:  Procedure Laterality Date   APPENDECTOMY     CHOLECYSTECTOMY     Patient Active Problem List   Diagnosis Date Noted   CAD (coronary artery disease) 12/31/2021   Near syncope 11/24/2021   Pulmonary embolism ([place]) 11/24/2021   Elevated troponin 11/24/2021   Hypoxia 11/01/2021   Acute on chronic respiratory failure with hypoxia (HCC)    SVT (supraventricular tachycardia) (H[facility])    COPD exacerbation (H[facility])    Gastroesophageal reflux disease without esophagitis    COVID-19 virus infection 10/31/2021     REFERRING DIAG: Muscle weakness (generalized)   THERAPY DIAG:  Difficulty in walking, not elsewhere classified  Muscle weakness (generalized)  Other abnormalities of gait and mobility  Rationale for Evaluation and Treatment Rehabilitation  PERTINENT HISTORY: 88 year old male with referral for general muscle weakness. He has COPD and recent bout of COVID in Dec 2022 and reports progressive weakness since. He does endorse some ongoing right hip pain with recent injection with no improvement. Patient has 2L O2 as needed but states has not needed lately.   PRECAUTIONS: falls  SUBJECTIVE:Patient reports compliance with HEP. Is doing better with his balance but still an area of improvement.   PAIN:  Are you having pain? No     TODAY'S TREATMENT:   *Progress note- all goals just assessed 2 visits ago during recert Therex:   Nustep LE only  Level 1- 1 min Level 2 - 1 min Level 3- 1 min Level 4- 89mn seat position 8 - VC for keeping SPM>70   Sit to stand without  BUE support x 10 reps (increased difficulty towards last 3) Sp02 92%  Leg press= 40# BLE x 12 reps; 55 lb x 12 reps BLE    Neuromuscular re-ed:  Braiding/Carioca- in // bars x 6 times (1st 2 with 1 UE support- then progressed  -lateral stepping 6x  length of // bars with finger tip support  -tandem walking 4x length of // bars  -Golf swing with 7 iron- x approx 20 swings - Golfer's lean (SLS) using golf club for 1 UE Support  x 15 reps                                     Education provided throughout session via VC/TC and demonstration to facilitate movement at target joints and correct muscle activation for all testing and exercises performed.      PATIENT EDUCATION: Education details: Exercise Technique Person educated: Patient Education method: Explanation, Demonstration, Tactile cues, and Verbal cues Education comprehension: verbalized understanding, returned demonstration, verbal cues required,  tactile cues required, and needs further education   HOME EXERCISE PROGRAM:   Access Code: [email or web address] URL: [email or web address] Date: 06/06/2022 Prepared by: Janna Arch  Program Notes **Be sure to perform all exercises next to a countertop or sturdy piece of furniture in case you become unsteady.  Exercises - Standing Heel Raise with Support  - 1 x daily - 7 x weekly - 2 sets - 15 reps - Standing Toe Raises at Chair  - 1 x daily - 7 x weekly - 2 sets - 15 reps - Mini Squat with Counter Support  - 1 x daily - 7 x weekly - 2 sets - 15 reps - Standing Tandem Balance with Counter Support  - 1 x daily - 7 x weekly - 2 sets - 2 reps - 30 hold - Standing Single Leg Stance with Counter Support  - 1 x daily - 7 x weekly - 2 sets - 2 reps - 30 hold   PT Short Term Goals -      PT SHORT TERM GOAL #1   Title Pt will be independent with initial HEP in order to improve strength and balance in order to decrease fall risk and improve function at home and work    Baseline 03/13/2022= Patient presents with no formal HEP in place; 04/19/2022=Patient reports he is doing his bike riding and torso twist along with walking.  7/26: compliant; 06/19/2022= Patient reports compliant with HEP including m   Time 6    Period Weeks    Status On-going    Target Date MET     PT SHORT TERM GOAL #2   Title Patient will be iindependent with knowledge and performance of pursed lip breathing for imroved recovery of breathing.    Baseline 03/13/2022- Patient with limited knowledge of how to recover from shortness of breath. 04/19/2022-Patient verbalized and demonstrates independence with pursed lip breathing technique.    Time 6    Period Weeks    Status Achieved    Target Date 04/24/22              PT Long Term Goals -      PT LONG TERM GOAL #1   Title Pt will be independent with Final HEP in order to improve strength and balance in order to decrease fall risk and improve function at home and work     Baseline 03/13/2022= Patient presents with no formal HEP in place 7/26: HEP compliant   Time 12    Period Weeks    Status Achieved   Target Date 06/05/22      PT LONG TERM GOAL #2   Title Pt will improve FOTO to target score of 66 to display perceived improvements  in ability to complete ADL's.    Baseline 03/13/2022=57: 04/19/2022=66 7/26:80%    Time 12    Period Weeks    Status MET    Target Date 06/05/22      PT LONG TERM GOAL #3   Title Pt will decrease 5TSTS by at least 2 seconds inlcuding ability to perform with LE only in order to demonstrate clinically significant improvement in LE strength.    Baseline 03/13/2022= 15.12 sec with BUE Support; 04/19/2022=19.14 sec with hands on knees (minimal UE support).  7/26: 15.7 seconds with hands on knees   Time 12    Period Weeks    Status On-going    Target Date 08/29/2022       PT LONG TERM GOAL #4   Title Pt will increase 6MWT by at least 83m(1676f- maintaining > 90% O2 sat in order to demonstrate clinically significant improvement in cardiopulmonary endurance and community ambulation    Baseline 03/13/2022= 970 feet using no AD; 04/19/2022= 1050 feet = 90%SpO2  7/26: 990 ft with SP02 90%   Time 12    Period Weeks    Status On-going    Target Date 08/29/2022          PT LONG TERM GOAL #  Title  Patient will increase Functional Gait Assessment score to >20/30 as to reduce fall risk and improve dynamic gait safety with community ambulation.  Baseline 7/26: 13/30  Time 12   Period Weeks   Status New  Target Date 08/29/2022            Plan -     Clinical Impression Statement  Patient performed well with all balance activities- Able to perform adequate golf swing without LOB. He was fatigued during session yet no signficant difficulty with balance or strengthening exercises. Able to stand some without UE support. Patient will continue to benefit from skilled physical therapy interventions in order to improve endurance, balance,  in order to help him return to previous level of independence with community activities.    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    PT Home Exercise Plan Instructed in walking program using RPE scale and use of pulse ox    Consulted and Agree with Plan of Care Patient               JeLewis MoccasinPT 06/20/2022, 1:25 PM

## 2022-06-19 NOTE — Patient Instructions (Addendum)
Cryotherapy Aftercare  Wash gently with soap and water everyday.   Apply Vaseline and Band-Aid daily until healed.   Arms/Hands/Legs, any area of  bruising: Can use OTC arnica containing moisturizer such as Dermend Bruise Formula or Fragile Skin Formula if desired  Wound Care Instructions  Cleanse wound gently with soap and water once a day then pat dry with clean gauze. Apply a thin coat of Petrolatum (petroleum jelly, "Vaseline") over the wound (unless you have an allergy to this). We recommend that you use a new, sterile tube of Vaseline. Do not pick or remove scabs. Do not remove the yellow or white "healing tissue" from the base of the wound.  Cover the wound with fresh, clean, nonstick gauze and secure with paper tape. You may use Band-Aids in place of gauze and tape if the wound is small enough, but would recommend trimming much of the tape off as there is often too much. Sometimes Band-Aids can irritate the skin.  You should call the office for your biopsy report after 1 week if you have not already been contacted.  If you experience any problems, such as abnormal amounts of bleeding, swelling, significant bruising, significant pain, or evidence of infection, please call the office immediately.  FOR ADULT SURGERY PATIENTS: If you need something for pain relief you may take 1 extra strength Tylenol (acetaminophen) AND 2 Ibuprofen ('200mg'$  each) together every 4 hours as needed for pain. (do not take these if you are allergic to them or if you have a reason you should not take them.) Typically, you may only need pain medication for 1 to 3 days.    Due to recent changes in healthcare laws, you may see results of your pathology and/or laboratory studies on MyChart before the doctors have had a chance to review them. We understand that in some cases there may be results that are confusing or concerning to you. Please understand that not all results are received at the same time and often the  doctors may need to interpret multiple results in order to provide you with the best plan of care or course of treatment. Therefore, we ask that you please give [place] 2 business days to thoroughly review all your results before contacting the office for clarification. Should we see a critical lab result, you will be contacted sooner.   If You Need Anything After Your Visit  If you have any questions or concerns for your doctor, please call our main line at [phone] and press option 4 to reach your doctor's medical assistant. If no one answers, please leave a voicemail as directed and we will return your call as soon as possible. Messages left after 4 pm will be answered the following business day.   You may also send [place] a message via [facility]. We typically respond to MyChart messages within 1-2 business days.  For prescription refills, please ask your pharmacy to contact our office. Our fax number is [phone].  If you have an urgent issue when the clinic is closed that cannot wait until the next business day, you can page your doctor at the number below.    Please note that while we do our best to be available for urgent issues outside of office hours, we are not available 24/7.   If you have an urgent issue and are unable to reach [place], you may choose to seek medical care at your doctor's office, retail clinic, urgent care center, or emergency room.  If you have  a medical emergency, please immediately call 911 or go to the emergency department.  Pager Numbers  - Dr. Nehemiah Massed: [phone]  - Dr. Laurence Ferrari: [phone]  - Dr. Nicole Kindred: [phone]  In the event of inclement weather, please call our main line at [phone] for an update on the status of any delays or closures.  Dermatology Medication Tips: Please keep the boxes that topical medications come in in order to help keep track of the instructions about where and how to use these. Pharmacies typically print the medication  instructions only on the boxes and not directly on the medication tubes.   If your medication is too expensive, please contact our office at [phone] option 4 or send [place] a message through [place].   We are unable to tell what your co-pay for medications will be in advance as this is different depending on your insurance coverage. However, we may be able to find a substitute medication at lower cost or fill out paperwork to get insurance to cover a needed medication.   If a prior authorization is required to get your medication covered by your insurance company, please allow [place] 1-2 business days to complete this process.  Drug prices often vary depending on where the prescription is filled and some pharmacies may offer cheaper prices.  The website www.goodrx.com contains coupons for medications through different pharmacies. The prices here do not account for what the cost may be with help from insurance (it may be cheaper with your insurance), but the website can give you the price if you did not use any insurance.  - You can print the associated coupon and take it with your prescription to the pharmacy.  - You may also stop by our office during regular business hours and pick up a GoodRx coupon card.  - If you need your prescription sent electronically to a different pharmacy, notify our office through [facility] or by phone at [phone] option 4.     Si Usted Necesita Algo Despus de Su Visita  Tambin puede enviarnos un mensaje a travs de Pharmacist, community. Por lo general respondemos a los mensajes de MyChart en el transcurso de 1 a 2 das hbiles.  Para renovar recetas, por favor pida a su farmacia que se ponga en contacto con nuestra oficina. Harland Dingwall de fax es [place] [phone].  Si tiene un asunto urgente cuando la clnica est cerrada y que no puede esperar hasta el siguiente da hbil, puede llamar/localizar a su doctor(a) al nmero que aparece a continuacin.   Por  favor, tenga en cuenta que aunque hacemos todo lo posible para estar disponibles para asuntos urgentes fuera del horario de [place], no estamos disponibles las 24 horas del da, los 7 das de la [place].   Si tiene un problema urgente y no puede comunicarse con nosotros, puede optar por buscar atencin mdica  en el consultorio de su doctor(a), en una clnica privada, en un centro de atencin urgente o en una sala de emergencias.  Si tiene Engineering geologist, por favor llame inmediatamente al 911 o vaya a la sala de emergencias.  Nmeros de bper  - Dr. Nehemiah Massed: [phone]  - Dra. Moye: [phone]  - Dra. Nicole Kindred: [phone]  En caso de inclemencias del [place], por favor llame a Johnsie Kindred principal al [phone] para una actualizacin sobre el [place] de cualquier retraso o cierre.  Consejos para la medicacin en dermatologa: Por favor, guarde las cajas en las que vienen los medicamentos de uso tpico para  ayudarle a seguir las [employer] dnde y cmo usarlos. Las farmacias generalmente imprimen las instrucciones del medicamento slo en las cajas y no directamente en los tubos del [place].   Si su medicamento es muy caro, por favor, pngase en contacto con Zigmund Daniel llamando al [phone] y presione la opcin 4 o envenos un mensaje a travs de Pharmacist, community.   No podemos decirle cul ser su copago por los medicamentos por adelantado ya que esto es diferente dependiendo de la cobertura de su seguro. Sin embargo, es posible que podamos encontrar un medicamento sustituto a Electrical engineer un formulario para que el seguro cubra el medicamento que se considera necesario.   Si se requiere una autorizacin previa para que su compaa de seguros [place] su medicamento, por favor permtanos de 1 a 2 das hbiles para completar este proceso.  Los precios de los medicamentos varan con frecuencia dependiendo del Environmental consultant de dnde se surte la receta y alguna farmacias  pueden ofrecer precios ms baratos.  El sitio web www.goodrx.com tiene cupones para medicamentos de Airline pilot. Los precios aqu no tienen en cuenta lo que podra costar con la ayuda del seguro (puede ser ms barato con su seguro), pero el sitio web puede darle el precio si no utiliz Research scientist (physical sciences).  - Puede imprimir el cupn correspondiente y llevarlo con su receta a la farmacia.  - Tambin puede pasar por nuestra oficina durante el horario de atencin regular y Charity fundraiser una tarjeta de cupones de GoodRx.  - Si necesita que su receta se enve electrnicamente a una farmacia diferente, informe a nuestra oficina a travs de MyChart de [place] o por telfono llamando al [phone] y presione la opcin 4.

## 2022-06-21 ENCOUNTER — Ambulatory Visit: Payer: Medicare Other

## 2022-06-25 NOTE — Therapy (Signed)
OUTPATIENT PHYSICAL THERAPY TREATMENT NOTE   Patient Name: Dylan Weber MRN: [id number] DOB:[birth year], 88 y.o., male Today's Date: 06/26/2022  PCP: Lang Snow, NP REFERRING PROVIDER: Ottie Glazier MD   PT End of Session - 06/26/22 1250     Visit Number 21    Number of Visits 30    Date for PT Re-Evaluation 08/29/22    Authorization Time Period 03/13/2022-06/05/2022; 06/06/2022- 08/29/2022    Progress Note Due on Visit 20    PT Start Time 1259    PT Stop Time 1344    PT Time Calculation (min) 45 min    Equipment Utilized During Treatment Gait belt    Activity Tolerance Patient tolerated treatment well    Behavior During Therapy WFL for tasks assessed/performed                   Past Medical History:  Diagnosis Date   COPD (chronic obstructive pulmonary disease) ([place])    Coronary artery calcification seen on CT scan 11/17/2021   COVID-19 86/1537   Diastolic dysfunction    a. 11/2021 Echo: EF 55-60%, no rwma, GrI DD, nl RV fxn. Mild AI.   GERD (gastroesophageal reflux disease)    Pulmonary embolism ([place])    a. 11/2021 CTA Chest: small peripheral PE in RLL branch of PA-->Eliquis.   Right leg DVT ([place])    a. 11/2021 LE U/S: occlusive R calf vein DVT in R peroneal vein-->eliquis.   SVT (supraventricular tachycardia) ([place])    a. Dx 10/2021 in setting of COVID infxn.   Past Surgical History:  Procedure Laterality Date   APPENDECTOMY     CHOLECYSTECTOMY     Patient Active Problem List   Diagnosis Date Noted   CAD (coronary artery disease) 12/31/2021   Near syncope 11/24/2021   Pulmonary embolism ([place]) 11/24/2021   Elevated troponin 11/24/2021   Hypoxia 11/01/2021   Acute on chronic respiratory failure with hypoxia ([facility])    SVT (supraventricular tachycardia) ([facility])    COPD exacerbation ([facility])    Gastroesophageal reflux disease without esophagitis    COVID-19 virus infection 10/31/2021    REFERRING DIAG: Muscle weakness (generalized)   THERAPY DIAG:   Difficulty in walking, not elsewhere classified  Muscle weakness (generalized)  Other abnormalities of gait and mobility  Rationale for Evaluation and Treatment Rehabilitation  PERTINENT HISTORY: 88 year old male with referral for general muscle weakness. He has COPD and recent bout of COVID in Dec 2022 and reports progressive weakness since. He does endorse some ongoing right hip pain with recent injection with no improvement. Patient has 2L O2 as needed but states has not needed lately.   PRECAUTIONS: falls  SUBJECTIVE:Patient reports he is having difficulty getting up/down from chair without UE support.   PAIN:  Are you having pain? No     TODAY'S TREATMENT:   TherEx: Nustep Lvl 5 RPM>60 for cardiovascular challenge; x 3 minutes    5lb weights: -high knee march 6x length of // bars; patient reports L leg is more challenged than right -seated lateral step outs 10x each LE    Neuromuscular re-ed:  -Braiding/Carioca- in // bars x 6 times (1st 2 with 1 UE support- then progressed  -tandem walking 4x length of // bars  - Golfer's lean (SLS)  with forward reach 1 UE Support  x 15 reps to touch cone each side  Airex balance beam: -lateral stepping 6x length of // bars -tandem walk 6x length of // bars  ambulate in hallway: -horizontal head turns with cues for reading alphabet from cards 86 ft x 2 sets -horizontal head turns with cues for reading number and symbols from cards 86 ft x 2 sets  -"fast slow walking with cues for large steps and large arms with PT guiding for changes 4x length of // bars    Education provided throughout session via VC/TC and demonstration to facilitate movement at target joints and correct muscle activation for all testing and exercises performed.      PATIENT EDUCATION: Education details: Exercise Technique Person educated: Patient Education method: Explanation, Demonstration, Tactile cues, and Verbal  cues Education comprehension: verbalized understanding, returned demonstration, verbal cues required, tactile cues required, and needs further education   HOME EXERCISE PROGRAM:   Access Code: [email or web address] URL: [email or web address] Date: 06/06/2022 Prepared by: Janna Arch  Program Notes **Be sure to perform all exercises next to a countertop or sturdy piece of furniture in case you become unsteady.  Exercises - Standing Heel Raise with Support  - 1 x daily - 7 x weekly - 2 sets - 15 reps - Standing Toe Raises at Chair  - 1 x daily - 7 x weekly - 2 sets - 15 reps - Mini Squat with Counter Support  - 1 x daily - 7 x weekly - 2 sets - 15 reps - Standing Tandem Balance with Counter Support  - 1 x daily - 7 x weekly - 2 sets - 2 reps - 30 hold - Standing Single Leg Stance with Counter Support  - 1 x daily - 7 x weekly - 2 sets - 2 reps - 30 hold   PT Short Term Goals -      PT SHORT TERM GOAL #1   Title Pt will be independent with initial HEP in order to improve strength and balance in order to decrease fall risk and improve function at home and work    Baseline 03/13/2022= Patient presents with no formal HEP in place; 04/19/2022=Patient reports he is doing his bike riding and torso twist along with walking.  7/26: compliant; 06/19/2022= Patient reports compliant with HEP including m   Time 6    Period Weeks    Status On-going    Target Date MET     PT SHORT TERM GOAL #2   Title Patient will be iindependent with knowledge and performance of pursed lip breathing for imroved recovery of breathing.    Baseline 03/13/2022- Patient with limited knowledge of how to recover from shortness of breath. 04/19/2022-Patient verbalized and demonstrates independence with pursed lip breathing technique.    Time 6    Period Weeks    Status Achieved    Target Date 04/24/22              PT Long Term Goals -      PT LONG TERM GOAL #1   Title Pt will be independent with Final HEP in order  to improve strength and balance in order to decrease fall risk and improve function at home and work    Baseline 03/13/2022= Patient presents with no formal HEP in place 7/26: HEP compliant   Time 12    Period Weeks    Status Achieved   Target Date 06/05/22      PT LONG TERM GOAL #2   Title Pt will improve FOTO to target score of 66 to display perceived improvements in ability to complete ADL's.    Baseline 03/13/2022=57: 04/19/2022=66 7/26:80%    Time 12  Period Weeks    Status MET    Target Date 06/05/22      PT LONG TERM GOAL #3   Title Pt will decrease 5TSTS by at least 2 seconds inlcuding ability to perform with LE only in order to demonstrate clinically significant improvement in LE strength.    Baseline 03/13/2022= 15.12 sec with BUE Support; 04/19/2022=19.14 sec with hands on knees (minimal UE support).  7/26: 15.7 seconds with hands on knees   Time 12    Period Weeks    Status On-going    Target Date 08/29/2022       PT LONG TERM GOAL #4   Title Pt will increase 6MWT by at least 49m(1674f- maintaining > 90% O2 sat in order to demonstrate clinically significant improvement in cardiopulmonary endurance and community ambulation    Baseline 03/13/2022= 970 feet using no AD; 04/19/2022= 1050 feet = 90%SpO2  7/26: 990 ft with SP02 90%   Time 12    Period Weeks    Status On-going    Target Date 08/29/2022          PT LONG TERM GOAL #  Title  Patient will increase Functional Gait Assessment score to >20/30 as to reduce fall risk and improve dynamic gait safety with community ambulation.  Baseline 7/26: 13/30  Time 12   Period Weeks   Status New  Target Date 08/29/2022            Plan -     Clinical Impression Statement  Patient requires constant monitoring of Sp02 due to dropping in 80%s. Patient requires intermittent cueing for decreasing velocity of movement to improve recruitment patterning.  Patient will continue to benefit from skilled physical therapy interventions  in order to improve endurance, balance, in order to help him return to previous level of independence with community activities.    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    PT Home Exercise Plan Instructed in walking program using RPE scale and use of pulse ox    Consulted and Agree with Plan of Care Patient               MaJanna ArchPT 06/26/2022, 1:45 PM

## 2022-06-26 ENCOUNTER — Ambulatory Visit: Payer: Medicare Other

## 2022-06-26 DIAGNOSIS — R262 Difficulty in walking, not elsewhere classified: Secondary | ICD-10-CM

## 2022-06-26 DIAGNOSIS — R2689 Other abnormalities of gait and mobility: Secondary | ICD-10-CM

## 2022-06-26 DIAGNOSIS — M6281 Muscle weakness (generalized): Secondary | ICD-10-CM

## 2022-06-28 ENCOUNTER — Ambulatory Visit: Payer: Medicare Other

## 2022-07-02 ENCOUNTER — Encounter: Payer: Self-pay | Admitting: Dermatology

## 2022-07-02 NOTE — Therapy (Signed)
OUTPATIENT PHYSICAL THERAPY TREATMENT NOTE   Patient Name: Dylan Weber MRN: [id number] DOB:1933-07-31, 88 y.o., male Today's Date: 07/03/2022  PCP: Lang Snow, NP REFERRING PROVIDER: Ottie Glazier MD   PT End of Session - 07/03/22 1338     Visit Number 22    Number of Visits 30    Date for PT Re-Evaluation 08/29/22    Authorization Time Period 03/13/2022-06/05/2022; 06/06/2022- 08/29/2022    Progress Note Due on Visit 20    PT Start Time 1344    PT Stop Time 1429    PT Time Calculation (min) 45 min    Equipment Utilized During Treatment Gait belt    Activity Tolerance Patient tolerated treatment well    Behavior During Therapy WFL for tasks assessed/performed                    Past Medical History:  Diagnosis Date   COPD (chronic obstructive pulmonary disease) ([place])    Coronary artery calcification seen on CT scan 11/17/2021   COVID-19 24/8250   Diastolic dysfunction    a. 11/2021 Echo: EF 55-60%, no rwma, GrI DD, nl RV fxn. Mild AI.   GERD (gastroesophageal reflux disease)    Pulmonary embolism ([place])    a. 11/2021 CTA Chest: small peripheral PE in RLL branch of PA-->Eliquis.   Right leg DVT ([place])    a. 11/2021 LE U/S: occlusive R calf vein DVT in R peroneal vein-->eliquis.   SVT (supraventricular tachycardia) ([place])    a. Dx 10/2021 in setting of COVID infxn.   Past Surgical History:  Procedure Laterality Date   APPENDECTOMY     CHOLECYSTECTOMY     Patient Active Problem List   Diagnosis Date Noted   CAD (coronary artery disease) 12/31/2021   Near syncope 11/24/2021   Pulmonary embolism ([place]) 11/24/2021   Elevated troponin 11/24/2021   Hypoxia 11/01/2021   Acute on chronic respiratory failure with hypoxia ([facility])    SVT (supraventricular tachycardia) ([facility])    COPD exacerbation ([facility])    Gastroesophageal reflux disease without esophagitis    COVID-19 virus infection 10/31/2021    REFERRING DIAG: Muscle weakness (generalized)   THERAPY DIAG:   Difficulty in walking, not elsewhere classified  Muscle weakness (generalized)  Other abnormalities of gait and mobility  Rationale for Evaluation and Treatment Rehabilitation  PERTINENT HISTORY: 88 year old male with referral for general muscle weakness. He has COPD and recent bout of COVID in Dec 2022 and reports progressive weakness since. He does endorse some ongoing right hip pain with recent injection with no improvement. Patient has 2L O2 as needed but states has not needed lately.   PRECAUTIONS: falls  SUBJECTIVE:Patient has started doing pushups daily. Arm is bruised from brushing up against the wall. Is going to the beach tomorrow.   PAIN:  Are you having pain? No     TODAY'S TREATMENT:   TherEx: Nustep Lvl 5 RPM>60 for cardiovascular challenge; x 3 minutes    Adduction ball squeeze 15x Sit to stand 10x no UE support Heel raise 15x no UE support    Neuromuscular re-ed:  -Braiding/Carioca- in // bars x 6 times (1st 2 with 1 UE support- then progressed  -tandem walking 4x length of // bars  - Golfer's lean (SLS)  with forward reach 1 UE Support  x 15 reps to touch cone each side  Bosu ball round side up" -modified crane step up 10x each LE -lateral squat 10x each LE  Bosu ball flat side  up: static stand 30 seconds  Airex balance beam: -lateral stepping 6x length of // bars -tandem walk 6x length of // bars                              ambulate in hallway: Ball vertical toss 160 ft Ball bounce 160 ft ; one near LOB   Education provided throughout session via VC/TC and demonstration to facilitate movement at target joints and correct muscle activation for all testing and exercises performed.      PATIENT EDUCATION: Education details: Exercise Technique Person educated: Patient Education method: Explanation, Demonstration, Tactile cues, and Verbal cues Education comprehension: verbalized understanding, returned demonstration, verbal cues required,  tactile cues required, and needs further education   HOME EXERCISE PROGRAM:   Access Code: [email or web address] URL: [email or web address] Date: 06/06/2022 Prepared by: Janna Arch  Program Notes **Be sure to perform all exercises next to a countertop or sturdy piece of furniture in case you become unsteady.  Exercises - Standing Heel Raise with Support  - 1 x daily - 7 x weekly - 2 sets - 15 reps - Standing Toe Raises at Chair  - 1 x daily - 7 x weekly - 2 sets - 15 reps - Mini Squat with Counter Support  - 1 x daily - 7 x weekly - 2 sets - 15 reps - Standing Tandem Balance with Counter Support  - 1 x daily - 7 x weekly - 2 sets - 2 reps - 30 hold - Standing Single Leg Stance with Counter Support  - 1 x daily - 7 x weekly - 2 sets - 2 reps - 30 hold   PT Short Term Goals -      PT SHORT TERM GOAL #1   Title Pt will be independent with initial HEP in order to improve strength and balance in order to decrease fall risk and improve function at home and work    Baseline 03/13/2022= Patient presents with no formal HEP in place; 04/19/2022=Patient reports he is doing his bike riding and torso twist along with walking.  7/26: compliant; 06/19/2022= Patient reports compliant with HEP including m   Time 6    Period Weeks    Status On-going    Target Date MET     PT SHORT TERM GOAL #2   Title Patient will be iindependent with knowledge and performance of pursed lip breathing for imroved recovery of breathing.    Baseline 03/13/2022- Patient with limited knowledge of how to recover from shortness of breath. 04/19/2022-Patient verbalized and demonstrates independence with pursed lip breathing technique.    Time 6    Period Weeks    Status Achieved    Target Date 04/24/22              PT Long Term Goals -      PT LONG TERM GOAL #1   Title Pt will be independent with Final HEP in order to improve strength and balance in order to decrease fall risk and improve function at home and work     Baseline 03/13/2022= Patient presents with no formal HEP in place 7/26: HEP compliant   Time 12    Period Weeks    Status Achieved   Target Date 06/05/22      PT LONG TERM GOAL #2   Title Pt will improve FOTO to target score of 66 to display perceived improvements in ability to complete ADL's.  Baseline 03/13/2022=57: 04/19/2022=66 7/26:80%    Time 12    Period Weeks    Status MET    Target Date 06/05/22      PT LONG TERM GOAL #3   Title Pt will decrease 5TSTS by at least 2 seconds inlcuding ability to perform with LE only in order to demonstrate clinically significant improvement in LE strength.    Baseline 03/13/2022= 15.12 sec with BUE Support; 04/19/2022=19.14 sec with hands on knees (minimal UE support).  7/26: 15.7 seconds with hands on knees   Time 12    Period Weeks    Status On-going    Target Date 08/29/2022       PT LONG TERM GOAL #4   Title Pt will increase 6MWT by at least 4m(1644f- maintaining > 90% O2 sat in order to demonstrate clinically significant improvement in cardiopulmonary endurance and community ambulation    Baseline 03/13/2022= 970 feet using no AD; 04/19/2022= 1050 feet = 90%SpO2  7/26: 990 ft with SP02 90%   Time 12    Period Weeks    Status On-going    Target Date 08/29/2022          PT LONG TERM GOAL #  Title  Patient will increase Functional Gait Assessment score to >20/30 as to reduce fall risk and improve dynamic gait safety with community ambulation.  Baseline 7/26: 13/30  Time 12   Period Weeks   Status New  Target Date 08/29/2022            Plan -     Clinical Impression Statement  Patient presents with excellent motivation. He has improved oxygen saturation levels this session with decreased rest breaks required.   Patient is able to perform sit to stands without UE support this session. Patient will continue to benefit from skilled physical therapy interventions in order to improve endurance, balance, in order to help him return to  previous level of independence with community activities.    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    PT Home Exercise Plan Instructed in walking program using RPE scale and use of pulse ox    Consulted and Agree with Plan of Care Patient               MaJanna ArchPT 07/03/2022, 2:31 PM

## 2022-07-03 ENCOUNTER — Ambulatory Visit: Payer: Medicare Other

## 2022-07-03 DIAGNOSIS — R262 Difficulty in walking, not elsewhere classified: Secondary | ICD-10-CM

## 2022-07-03 DIAGNOSIS — M6281 Muscle weakness (generalized): Secondary | ICD-10-CM

## 2022-07-03 DIAGNOSIS — R2689 Other abnormalities of gait and mobility: Secondary | ICD-10-CM

## 2022-07-05 ENCOUNTER — Ambulatory Visit: Payer: Medicare Other

## 2022-07-07 NOTE — Progress Notes (Unsigned)
Cardiology Office Note  Date:  07/10/2022   ID:  Fadi Menter, DOB 04-Sep-1933, MRN [id number]  PCP:  Peggye Form, NP   Chief Complaint  Patient presents with   6 month follow up     "Doing well." Medications reviewed by the patient verbally.     HPI:  Kaylee Wombles is a 88 year old male with history of  paroxysmal SVT,  PE, DVT  COPD on 2 l home oxygen, former smoker x 50+ years,  Admission to the hospital January 2023 feeling faint, presyncope/SVT COVID CT showing PE, lower extremity ultrasound was obtained showing DVT in the right lower extremity.,  off Eliquis now Who presents for follow-up in the office for his PE and SVT/atrial tachycardia  Last seen by myself in clinic 02/09/2022  Does PT once a week  Low pressures Tolerating bisoprolol 2.5 daily Having memory issues  Walks with a cane Off Eliquis, was having significant bruising Not taking spironolactone or Flomax secondary to low blood pressure Denies orthostasis symptoms Weight stable  On bisoprolol 2.5 daily denies significant flushing or breakthrough tachypalpitations  Reports drinking good amount of fluids, staying hydrated  EKG personally reviewed by myself on todays visit Normal sinus rhythm rate 74 bpm right bundle branch block, old inferior MI  Seen by one of our providers January 24, 2022, bisoprolol dosing decreased at that time  Was seen by pulmonary February 01, 2022, for low blood pressure ,bisoprolol and spironolactone held  Was seen in the hospital February 02, 3613 Reported systolic pressures at home in the 80s, was asymptomatic On the ER visit blood pressure 121/79, repeat blood pressure 101/79  3/28 and 3/29, brief epsiodes Feels like body is "lighting up like christmas tree" Heart rate up to 100 , baseline 50 to 60 Restarted taking bisoprolol 2.5 daily, symptoms improved  Other past medical history reviewed Copd exaerbation, hospitalized, d/c 01/02/22 Initially presented on 15 L HFNC,  weaned down to 2 L steroids, nebs, inhalers and empiric antibiotics  10/17/21 reports seeing pulmonary, COVID-19 positive, COPD exacerbation Treated with PACs Flovent and prednisone  On October 31, 2021 presented to the emergency room with near syncope Saturations 84% on room air Was admitted overnight Treated for atrial tachycardia/SVT, started on Cardizem CD  November 17, 2021 CT scan chest, small PE  November 24, 2021 Admitted to the hospital with near syncope Lower extremity venous Doppler : occlusive right calf vein DVT in the right peroneal vein. No other DVT identified.  For tachycardia diltiazem extended release increased up to 180 mg daily Started on Eliquis at discharge  Echocardiogram reviewed on today's visit November 25, 2021  1. Left ventricular ejection fraction, by estimation, is 55 to 60%. The  left ventricle has normal function. The left ventricle has no regional  wall motion abnormalities. Left ventricular diastolic parameters are  consistent with Grade I diastolic  dysfunction (impaired relaxation).   2. Right ventricular systolic function is normal. The right ventricular  size is not well visualized.   3. The mitral valve is grossly normal. No evidence of mitral valve  regurgitation.   4. The aortic valve was not well visualized. Aortic valve regurgitation  is mild.   PMH:   has a past medical history of COPD (chronic obstructive pulmonary disease) ([place]), Coronary artery calcification seen on CT scan (11/17/2021), COVID-19 (43/1540), Diastolic dysfunction, GERD (gastroesophageal reflux disease), Pulmonary embolism ([place]), Right leg DVT ([place]), and SVT (supraventricular tachycardia) ([place]).  PSH:    Past Surgical History:  Procedure Laterality  Date   APPENDECTOMY     CHOLECYSTECTOMY      Current Outpatient Medications  Medication Sig Dispense Refill   acetaminophen (TYLENOL) 650 MG CR tablet Take 650 mg by mouth as needed for pain.     albuterol (VENTOLIN  HFA) 108 (90 Base) MCG/ACT inhaler Inhale 2 puffs into the lungs every 6 (six) hours as needed for wheezing or shortness of breath. 8 g 0   bacitracin ointment Apply topically 2 (two) times daily.     bisoprolol (ZEBETA) 5 MG tablet Take 0.5 tablet (2.5 mg) by mouth once daily in the morning. You may take an extra 0.5 tablet (2.5 mg) by mouth once daily as needed for episodes of flushing 90 tablet 3   BREO ELLIPTA 200-25 MCG/ACT AEPB Inhale 1 puff into the lungs daily.     ciclopirox (LOPROX) 0.77 % cream Apply twice daily to feet until clear then once weekly for prevention. 30 g 2   guaiFENesin (MUCINEX) 600 MG 12 hr tablet Take 600 mg by mouth daily.     Ipratropium-Albuterol (COMBIVENT) 20-100 MCG/ACT AERS respimat Inhale 2 puffs into the lungs 2 (two) times daily.     Multiple Vitamin (MULTIVITAMIN) capsule Take 1 capsule by mouth daily.     pantoprazole (PROTONIX) 40 MG tablet Take 40 mg by mouth daily.     pseudoephedrine-acetaminophen (TYLENOL SINUS) 30-500 MG TABS tablet Take 1 tablet by mouth as needed.     torsemide (DEMADEX) 10 MG tablet Take 1 tablet (10 mg) by mouth once daily as needed for swelling (Patient taking differently: Take 5 mg by mouth once. Take 1 tablet (10 mg) by mouth once daily as needed for swelling) 30 tablet 11   triamcinolone (NASACORT) 55 MCG/ACT AERO nasal inhaler Place 2 sprays into the nose daily.     ALPRAZolam (XANAX) 0.25 MG tablet Take 1 tablet (0.25 mg total) by mouth 3 (three) times daily as needed for anxiety. (Patient not taking: Reported on 07/10/2022) 6 tablet 0   ascorbic acid (VITAMIN C) 500 MG tablet Take 1 tablet (500 mg total) by mouth daily. (Patient not taking: Reported on 02/09/2022) 30 tablet 0   doxepin (SINEQUAN) 10 MG capsule Take 10 mg by mouth at bedtime. (Patient not taking: Reported on 02/09/2022)     predniSONE (STERAPRED UNI-PAK 21 TAB) 10 MG (21) TBPK tablet Start 60 mg po daily, taper 10 mg daily until finish (Patient not taking: Reported  on 02/09/2022) 21 tablet 0   tamsulosin (FLOMAX) 0.4 MG CAPS capsule Take 0.4 mg by mouth every other day. Take 30 minutes after the same meal everyday (Patient not taking: Reported on 07/10/2022)     zinc sulfate 220 (50 Zn) MG capsule Take 1 capsule (220 mg total) by mouth daily. (Patient not taking: Reported on 02/09/2022) 30 capsule 0   No current facility-administered medications for this visit.     Allergies:   Diltiazem, Prednisone, Amoxicillin, and Sulfamethoxazole-trimethoprim   Social History:  The patient  reports that he has quit smoking. His smoking use included cigarettes. He has quit using smokeless tobacco. He reports current alcohol use of about 7.0 standard drinks of alcohol per week. He reports that he does not use drugs.   Family History:   family history includes Brain cancer in his sister; Uterine cancer in his mother.    Review of Systems: Review of Systems  Constitutional: Negative.   HENT: Negative.    Respiratory:  Positive for shortness of breath.   Cardiovascular:  Negative.   Gastrointestinal: Negative.   Musculoskeletal: Negative.   Neurological: Negative.   Psychiatric/Behavioral: Negative.    All other systems reviewed and are negative.   PHYSICAL EXAM: VS:  BP (!) 90/50 (BP Location: Left Arm, Patient Position: Sitting, Cuff Size: Normal)   Pulse 74   Ht '5\' 9"'$  (1.753 m)   Wt 167 lb 6 oz (75.9 kg)   BMI 24.72 kg/m  , BMI Body mass index is 24.72 kg/m. Constitutional:  oriented to person, place, and time. No distress.  HENT:  Head: Grossly normal Eyes:  no discharge. No scleral icterus.  Neck: No JVD, no carotid bruits  Cardiovascular: Regular rate and rhythm, no murmurs appreciated Pulmonary/Chest: Clear to auscultation bilaterally,  Mildly decreased breath sounds throughout Abdominal: Soft.  no distension.  no tenderness.  Musculoskeletal: Normal range of motion Neurological:  normal muscle tone. Coordination normal. No atrophy Skin:  Bruising on arms, legs Psychiatric: normal affect, pleasant   Recent Labs: 10/31/2021: TSH 1.028 12/31/2021: ALT 21; B Natriuretic Peptide 65.3 02/02/2022: BUN 25; Creatinine, Ser 0.90; Hemoglobin 12.4; Platelets 183; Potassium 3.6; Sodium 138    Lipid Panel Lab Results  Component Value Date   CHOL 145 11/25/2021   HDL 57 11/25/2021   LDLCALC 77 11/25/2021   TRIG 53 11/25/2021      Wt Readings from Last 3 Encounters:  07/10/22 167 lb 6 oz (75.9 kg)  03/13/22 160 lb (72.6 kg)  02/09/22 163 lb (73.9 kg)      ASSESSMENT AND PLAN:  Problem List Items Addressed This Visit       Cardiology Problems   Pulmonary embolism ([place])   Relevant Orders   EKG 12-Lead   CAD (coronary artery disease)   Relevant Orders   EKG 12-Lead   SVT (supraventricular tachycardia) ([place]) - Primary   Relevant Orders   EKG 12-Lead     Other   COPD exacerbation ([place])   Other Visit Diagnoses     Sinus bradycardia       Relevant Orders   EKG 12-Lead   Low blood pressure reading       Relevant Orders   EKG 12-Lead   Chronic respiratory failure with hypoxia (HCC)       Relevant Orders   EKG 12-Lead     SVT/atrial tachycardia Recommend he continue bisoprolol 2.5 daily Without the bisoprolol was having breakthrough flushing episodes consistent with SVT/arrhythmia Denies having any recently Low blood pressure but no orthostasis  Pulmonary embolism Likely in the setting of being sedentary during COVID-19 infection No right heart strain, small on CT On last clinic visit taking Eliquis. 2.5 twice daily, prophylactic dose Now off Eliquis, still with significant bruising  Chronic respiratory distress Long history of smoking, COPD, recent COVID, no PE On oxygen 3 L, Clinically stable, followed by pulmonary On torsemide 10 daily    Total encounter time more than 30 minutes  Greater than 50% was spent in counseling and coordination of care with the patient    Signed, Esmond Plants, M.D.,  Ph.D. [place], [place]

## 2022-07-10 ENCOUNTER — Ambulatory Visit: Payer: Medicare Other

## 2022-07-10 ENCOUNTER — Encounter: Payer: Self-pay | Admitting: Cardiovascular Disease

## 2022-07-10 ENCOUNTER — Ambulatory Visit: Payer: Medicare Other | Attending: Cardiovascular Disease | Admitting: Cardiovascular Disease

## 2022-07-10 VITALS — BP 90/50 | HR 74 | Ht 69.0 in | Wt 167.4 lb

## 2022-07-10 DIAGNOSIS — I471 Supraventricular tachycardia: Secondary | ICD-10-CM | POA: Diagnosis not present

## 2022-07-10 DIAGNOSIS — R001 Bradycardia, unspecified: Secondary | ICD-10-CM

## 2022-07-10 DIAGNOSIS — R2689 Other abnormalities of gait and mobility: Secondary | ICD-10-CM

## 2022-07-10 DIAGNOSIS — R262 Difficulty in walking, not elsewhere classified: Secondary | ICD-10-CM | POA: Diagnosis not present

## 2022-07-10 DIAGNOSIS — I25118 Atherosclerotic heart disease of native coronary artery with other forms of angina pectoris: Secondary | ICD-10-CM

## 2022-07-10 DIAGNOSIS — J9611 Chronic respiratory failure with hypoxia: Secondary | ICD-10-CM

## 2022-07-10 DIAGNOSIS — M6281 Muscle weakness (generalized): Secondary | ICD-10-CM

## 2022-07-10 DIAGNOSIS — J441 Chronic obstructive pulmonary disease with (acute) exacerbation: Secondary | ICD-10-CM

## 2022-07-10 DIAGNOSIS — R031 Nonspecific low blood-pressure reading: Secondary | ICD-10-CM

## 2022-07-10 DIAGNOSIS — I2699 Other pulmonary embolism without acute cor pulmonale: Secondary | ICD-10-CM

## 2022-07-10 MED ORDER — BISOPROLOL FUMARATE 5 MG PO TABS: ORAL_TABLET | ORAL | 3 refills | Status: DC

## 2022-07-10 NOTE — Therapy (Signed)
OUTPATIENT PHYSICAL THERAPY TREATMENT NOTE   Patient Name: Dylan Weber MRN: [id number] DOB:05-07-1933, 88 y.o., male Today's Date: 07/10/2022  PCP: Lang Snow, NP REFERRING PROVIDER: Ottie Glazier MD   PT End of Session - 07/10/22 1258     Visit Number 23    Number of Visits 30    Date for PT Re-Evaluation 08/29/22    Authorization Time Period 03/13/2022-06/05/2022; 06/06/2022- 08/29/2022    Progress Note Due on Visit 20    PT Start Time 1300    PT Stop Time 1344    PT Time Calculation (min) 44 min    Equipment Utilized During Treatment Gait belt    Activity Tolerance Patient tolerated treatment well    Behavior During Therapy WFL for tasks assessed/performed                    Past Medical History:  Diagnosis Date   COPD (chronic obstructive pulmonary disease) ([place])    Coronary artery calcification seen on CT scan 11/17/2021   COVID-19 13/2440   Diastolic dysfunction    a. 11/2021 Echo: EF 55-60%, no rwma, GrI DD, nl RV fxn. Mild AI.   GERD (gastroesophageal reflux disease)    Pulmonary embolism ([place])    a. 11/2021 CTA Chest: small peripheral PE in RLL branch of PA-->Eliquis.   Right leg DVT ([place])    a. 11/2021 LE U/S: occlusive R calf vein DVT in R peroneal vein-->eliquis.   SVT (supraventricular tachycardia) ([place])    a. Dx 10/2021 in setting of COVID infxn.   Past Surgical History:  Procedure Laterality Date   APPENDECTOMY     CHOLECYSTECTOMY     Patient Active Problem List   Diagnosis Date Noted   CAD (coronary artery disease) 12/31/2021   Near syncope 11/24/2021   Pulmonary embolism ([place]) 11/24/2021   Elevated troponin 11/24/2021   Hypoxia 11/01/2021   Acute on chronic respiratory failure with hypoxia (H[facility])    SVT (supraventricular tachycardia) ([facility])    COPD exacerbation ([facility])    Gastroesophageal reflux disease without esophagitis    COVID-19 virus infection 10/31/2021    REFERRING DIAG: Muscle weakness (generalized)   THERAPY DIAG:   Difficulty in walking, not elsewhere classified  Muscle weakness (generalized)  Other abnormalities of gait and mobility  Rationale for Evaluation and Treatment Rehabilitation  PERTINENT HISTORY: 88 year old male with referral for general muscle weakness. He has COPD and recent bout of COVID in Dec 2022 and reports progressive weakness since. He does endorse some ongoing right hip pain with recent injection with no improvement. Patient has 2L O2 as needed but states has not needed lately.   PRECAUTIONS: falls  SUBJECTIVE: Pt reports doing well. Had a good time on vacation without falls.   PAIN:  Are you having pain? No     TODAY'S TREATMENT:   There.ex: Nustep Lvl 4 RPM > 60 for cardiovascular challenge; 5 minutes  Sit to stand 1x10 no resistance. 2x8 due to fatigue. VC's for anterior trunk lean and scooting torso to edge of seat to assist in standing. SBA.  Heel raise 2x15 no UE support with heels over 2x4 plank.    Seate rest b/t STS exercises due to SPO2: 88% and increased WOB. Returns > 90% with seated rest.   Neuromuscular re-ed: Tandem walking on 2x4 planks. X6 lengths of // bars. Intermittent light touch of Ue's. SBA  Lateral steps on 2x4 in // bars. X4 laps. Intermittent light touch of Ue's. SBA  SLS with  contralateral LE gently resting on step. 3x30 sec holds. Difficulty standing on LLE with hip and ankle strategy to correct. Minor ankle strategy use of RLE.   Resisted walking backwards concentric control and forwards with eccentric control: 17.5 lbs, x5 reps. CGA. VC's for step lengths with backwards gait. Good eccentric control with forward steps. SPO2 at 86-87% post session. Required increased seated rest to recover. Pt asymptomatic.   Education provided throughout session via VC/TC and demonstration to facilitate movement at target joints and correct muscle activation for all testing and exercises performed.      PATIENT EDUCATION: Education details:  Exercise Technique Person educated: Patient Education method: Explanation, Demonstration, Tactile cues, and Verbal cues Education comprehension: verbalized understanding, returned demonstration, verbal cues required, tactile cues required, and needs further education   HOME EXERCISE PROGRAM:   Access Code: [email or web address] URL: [email or web address] Date: 06/06/2022 Prepared by: Janna Arch  Program Notes **Be sure to perform all exercises next to a countertop or sturdy piece of furniture in case you become unsteady.  Exercises - Standing Heel Raise with Support  - 1 x daily - 7 x weekly - 2 sets - 15 reps - Standing Toe Raises at Chair  - 1 x daily - 7 x weekly - 2 sets - 15 reps - Mini Squat with Counter Support  - 1 x daily - 7 x weekly - 2 sets - 15 reps - Standing Tandem Balance with Counter Support  - 1 x daily - 7 x weekly - 2 sets - 2 reps - 30 hold - Standing Single Leg Stance with Counter Support  - 1 x daily - 7 x weekly - 2 sets - 2 reps - 30 hold   PT Short Term Goals -      PT SHORT TERM GOAL #1   Title Pt will be independent with initial HEP in order to improve strength and balance in order to decrease fall risk and improve function at home and work    Baseline 03/13/2022= Patient presents with no formal HEP in place; 04/19/2022=Patient reports he is doing his bike riding and torso twist along with walking.  7/26: compliant; 06/19/2022= Patient reports compliant with HEP including m   Time 6    Period Weeks    Status On-going    Target Date MET     PT SHORT TERM GOAL #2   Title Patient will be iindependent with knowledge and performance of pursed lip breathing for imroved recovery of breathing.    Baseline 03/13/2022- Patient with limited knowledge of how to recover from shortness of breath. 04/19/2022-Patient verbalized and demonstrates independence with pursed lip breathing technique.    Time 6    Period Weeks    Status Achieved    Target Date 04/24/22               PT Long Term Goals -      PT LONG TERM GOAL #1   Title Pt will be independent with Final HEP in order to improve strength and balance in order to decrease fall risk and improve function at home and work    Baseline 03/13/2022= Patient presents with no formal HEP in place 7/26: HEP compliant   Time 12    Period Weeks    Status Achieved   Target Date 06/05/22      PT LONG TERM GOAL #2   Title Pt will improve FOTO to target score of 66 to display perceived improvements in ability to complete ADL's.  Baseline 03/13/2022=57: 04/19/2022=66 7/26:80%    Time 12    Period Weeks    Status MET    Target Date 06/05/22      PT LONG TERM GOAL #3   Title Pt will decrease 5TSTS by at least 2 seconds inlcuding ability to perform with LE only in order to demonstrate clinically significant improvement in LE strength.    Baseline 03/13/2022= 15.12 sec with BUE Support; 04/19/2022=19.14 sec with hands on knees (minimal UE support).  7/26: 15.7 seconds with hands on knees   Time 12    Period Weeks    Status On-going    Target Date 08/29/2022       PT LONG TERM GOAL #4   Title Pt will increase 6MWT by at least 82m(1658f- maintaining > 90% O2 sat in order to demonstrate clinically significant improvement in cardiopulmonary endurance and community ambulation    Baseline 03/13/2022= 970 feet using no AD; 04/19/2022= 1050 feet = 90%SpO2  7/26: 990 ft with SP02 90%   Time 12    Period Weeks    Status On-going    Target Date 08/29/2022          PT LONG TERM GOAL #  Title  Patient will increase Functional Gait Assessment score to >20/30 as to reduce fall risk and improve dynamic gait safety with community ambulation.  Baseline 7/26: 13/30  Time 12   Period Weeks   Status New  Target Date 08/29/2022            Plan -     Clinical Impression Statement Continuing PT POC with focus on improvement in static/dynamic balance and LE strengthening. Therapeutic rest breaks given due to  desaturation from 86-88% with exercise that returns to > 90% with rest. Pt with noticeable decreased balance with LLE compared to RLE requiring ankle/hip strategy to correct along with difficulty with reduced BOS with need for UE's on // bars. Patient will continue to benefit from skilled physical therapy interventions in order to improve endurance, balance, in order to help him return to previous level of independence with community activities.     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    PT Home Exercise Plan Instructed in walking program using RPE scale and use of pulse ox    Consulted and Agree with Plan of Care Patient              MiSalem CasterFairly IV, PT, DPT Physical Therapist- [facility]8/29/2023, 3:23 PM

## 2022-07-10 NOTE — Patient Instructions (Signed)
Medication Instructions:  No changes  If you need a refill on your cardiac medications before your next appointment, please call your pharmacy.   Lab work: No new labs needed  Testing/Procedures: No new testing needed  Follow-Up: At [facility], you and your health needs are our priority.  As part of our continuing mission to provide you with exceptional heart care, we have created designated Provider Care Teams.  These Care Teams include your primary Cardiologist (physician) and Advanced Practice Providers (APPs -  Physician Assistants and Nurse Practitioners) who all work together to provide you with the care you need, when you need it.  You will need a follow up appointment in 12 months  Providers on your designated Care Team:   Christopher Berge, NP Ryan Dunn, PA-C Cadence Furth, PA-C  COVID-19 Vaccine Information can be found at: [email or web address] For questions related to vaccine distribution or appointments, please email [email or web address] or call [phone].   

## 2022-07-12 ENCOUNTER — Ambulatory Visit: Payer: Medicare Other

## 2022-07-17 ENCOUNTER — Ambulatory Visit: Payer: Medicare Other | Admitting: Dermatology

## 2022-07-17 ENCOUNTER — Ambulatory Visit: Payer: Medicare Other | Attending: Pulmonary Disease

## 2022-07-17 DIAGNOSIS — M6281 Muscle weakness (generalized): Secondary | ICD-10-CM | POA: Diagnosis present

## 2022-07-17 DIAGNOSIS — D044 Carcinoma in situ of skin of scalp and neck: Secondary | ICD-10-CM | POA: Diagnosis not present

## 2022-07-17 DIAGNOSIS — D099 Carcinoma in situ, unspecified: Secondary | ICD-10-CM

## 2022-07-17 DIAGNOSIS — R2689 Other abnormalities of gait and mobility: Secondary | ICD-10-CM | POA: Diagnosis present

## 2022-07-17 DIAGNOSIS — R262 Difficulty in walking, not elsewhere classified: Secondary | ICD-10-CM | POA: Insufficient documentation

## 2022-07-17 DIAGNOSIS — C44311 Basal cell carcinoma of skin of nose: Secondary | ICD-10-CM | POA: Diagnosis not present

## 2022-07-17 NOTE — Therapy (Signed)
OUTPATIENT PHYSICAL THERAPY TREATMENT NOTE   Patient Name: Dylan Weber MRN: [id number] DOB:[birth year], 88 y.o., male Today's Date: 07/18/2022  PCP: Lang Snow, NP REFERRING PROVIDER: Ottie Glazier MD   PT End of Session - 07/17/22 1438     Visit Number 24    Number of Visits 30    Date for PT Re-Evaluation 08/29/22    Authorization Time Period 03/13/2022-06/05/2022; 06/06/2022- 08/29/2022    Progress Note Due on Visit 30    PT Start Time 1433    PT Stop Time 1511    PT Time Calculation (min) 38 min    Equipment Utilized During Treatment Gait belt    Activity Tolerance Patient tolerated treatment well    Behavior During Therapy WFL for tasks assessed/performed                     Past Medical History:  Diagnosis Date   COPD (chronic obstructive pulmonary disease) ([place])    Coronary artery calcification seen on CT scan 11/17/2021   COVID-19 46/2703   Diastolic dysfunction    a. 11/2021 Echo: EF 55-60%, no rwma, GrI DD, nl RV fxn. Mild AI.   GERD (gastroesophageal reflux disease)    Pulmonary embolism ([place])    a. 11/2021 CTA Chest: small peripheral PE in RLL branch of PA-->Eliquis.   Right leg DVT ([place])    a. 11/2021 LE U/S: occlusive R calf vein DVT in R peroneal vein-->eliquis.   SVT (supraventricular tachycardia) ([place])    a. Dx 10/2021 in setting of COVID infxn.   Past Surgical History:  Procedure Laterality Date   APPENDECTOMY     CHOLECYSTECTOMY     Patient Active Problem List   Diagnosis Date Noted   CAD (coronary artery disease) 12/31/2021   Near syncope 11/24/2021   Pulmonary embolism ([place]) 11/24/2021   Elevated troponin 11/24/2021   Hypoxia 11/01/2021   Acute on chronic respiratory failure with hypoxia (H[facility])    SVT (supraventricular tachycardia) ([facility])    COPD exacerbation ([facility])    Gastroesophageal reflux disease without esophagitis    COVID-19 virus infection 10/31/2021    REFERRING DIAG: Muscle weakness (generalized)   THERAPY DIAG:   Difficulty in walking, not elsewhere classified  Muscle weakness (generalized)  Other abnormalities of gait and mobility  Rationale for Evaluation and Treatment Rehabilitation  PERTINENT HISTORY: 88 year old male with referral for general muscle weakness. He has COPD and recent bout of COVID in Dec 2022 and reports progressive weakness since. He does endorse some ongoing right hip pain with recent injection with no improvement. Patient has 2L O2 as needed but states has not needed lately.   PRECAUTIONS: falls  SUBJECTIVE: Pt reports having some right hip soreness and states he had a cortizone injection that was helpful in the past and now may need another one.   PAIN:  Are you having pain? Yes; Location: Right Hip; Type= ache; Frequency= intermittent; Rates at 2/10     TODAY'S TREATMENT:   There.ex: Nustep  RPM > 60 for cardiovascular challenge;  6 minutes Level 1  x 1 min Level 4 x 1 min Level 1 x 1 min Level 4 x 1 min Level 1 x 30 sec Level 4 x 30 sec Level 1 x 1 min   Sit to stand 1x 15 reps no resistance and no UE Support .    Heel raise 2x15 no UE support with heels over 2x4 plank.   Seated (lumbar flex into ext) +Overhead reach- holding  2 kg ball      Neuromuscular re-ed: Tandem walking on 2x4 plank. X6 round- down and back in // bars. Intermittent light touch of Ue's. SBA  -Braiding/Carioca- in // bars x 5 times - without UE support (min difficulty initially- improved with each round.  -Golfers lean- in // bars (at edge- leaning forward to pick up cones and place back down) - reaching down to cones positioned on top of 6" block x 12 reps each LE with Min 1 UE Support.    SLS multiple attempts each leg with varying  sec holds from 2-11 at sec at best. Increased standing on left and Right LE today.     Education provided throughout session via VC/TC and demonstration to facilitate movement at target joints and correct muscle activation for all testing and  exercises performed.      PATIENT EDUCATION: Education details: Exercise Technique Person educated: Patient Education method: Explanation, Demonstration, Tactile cues, and Verbal cues Education comprehension: verbalized understanding, returned demonstration, verbal cues required, tactile cues required, and needs further education   HOME EXERCISE PROGRAM:   Access Code: [email or web address] URL: [email or web address] Date: 06/06/2022 Prepared by: Janna Arch  Program Notes **Be sure to perform all exercises next to a countertop or sturdy piece of furniture in case you become unsteady.  Exercises - Standing Heel Raise with Support  - 1 x daily - 7 x weekly - 2 sets - 15 reps - Standing Toe Raises at Chair  - 1 x daily - 7 x weekly - 2 sets - 15 reps - Mini Squat with Counter Support  - 1 x daily - 7 x weekly - 2 sets - 15 reps - Standing Tandem Balance with Counter Support  - 1 x daily - 7 x weekly - 2 sets - 2 reps - 30 hold - Standing Single Leg Stance with Counter Support  - 1 x daily - 7 x weekly - 2 sets - 2 reps - 30 hold   PT Short Term Goals -      PT SHORT TERM GOAL #1   Title Pt will be independent with initial HEP in order to improve strength and balance in order to decrease fall risk and improve function at home and work    Baseline 03/13/2022= Patient presents with no formal HEP in place; 04/19/2022=Patient reports he is doing his bike riding and torso twist along with walking.  7/26: compliant; 06/19/2022= Patient reports compliant with HEP including m   Time 6    Period Weeks    Status On-going    Target Date MET     PT SHORT TERM GOAL #2   Title Patient will be iindependent with knowledge and performance of pursed lip breathing for imroved recovery of breathing.    Baseline 03/13/2022- Patient with limited knowledge of how to recover from shortness of breath. 04/19/2022-Patient verbalized and demonstrates independence with pursed lip breathing technique.    Time 6     Period Weeks    Status Achieved    Target Date 04/24/22              PT Long Term Goals -      PT LONG TERM GOAL #1   Title Pt will be independent with Final HEP in order to improve strength and balance in order to decrease fall risk and improve function at home and work    Baseline 03/13/2022= Patient presents with no formal HEP in place 7/26: HEP compliant   Time 12  Period Weeks    Status Achieved   Target Date 06/05/22      PT LONG TERM GOAL #2   Title Pt will improve FOTO to target score of 66 to display perceived improvements in ability to complete ADL's.    Baseline 03/13/2022=57: 04/19/2022=66 7/26:80%    Time 12    Period Weeks    Status MET    Target Date 06/05/22      PT LONG TERM GOAL #3   Title Pt will decrease 5TSTS by at least 2 seconds inlcuding ability to perform with LE only in order to demonstrate clinically significant improvement in LE strength.    Baseline 03/13/2022= 15.12 sec with BUE Support; 04/19/2022=19.14 sec with hands on knees (minimal UE support).  7/26: 15.7 seconds with hands on knees   Time 12    Period Weeks    Status On-going    Target Date 08/29/2022       PT LONG TERM GOAL #4   Title Pt will increase 6MWT by at least 24m(1666f- maintaining > 90% O2 sat in order to demonstrate clinically significant improvement in cardiopulmonary endurance and community ambulation    Baseline 03/13/2022= 970 feet using no AD; 04/19/2022= 1050 feet = 90%SpO2  7/26: 990 ft with SP02 90%   Time 12    Period Weeks    Status On-going    Target Date 08/29/2022          PT LONG TERM GOAL #  Title  Patient will increase Functional Gait Assessment score to >20/30 as to reduce fall risk and improve dynamic gait safety with community ambulation.  Baseline 7/26: 13/30  Time 12   Period Weeks   Status New  Target Date 08/29/2022            Plan -     Clinical Impression Statement Patient challenged with dynamic standing balance activities- Specifically  tandem and SLS- improved with practice today. He presents with improving overall LE strength and no difficulty with Sit to stand without UE support. Patient will continue to benefit from skilled physical therapy interventions in order to improve endurance, balance, in order to help him return to previous level of independence with community activities.     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    PT Home Exercise Plan Instructed in walking program using RPE scale and use of pulse ox    Consulted and Agree with Plan of Care Patient              JeOllen BowlPT Physical Therapist- [facility]9/04/2022, 10:22 AM

## 2022-07-17 NOTE — Progress Notes (Signed)
   Follow-Up Visit   Subjective  Dylan Weber is a 88 y.o. 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  Wife with patient  The following portions of the chart were reviewed this encounter and updated as appropriate:   Tobacco  Allergies  Meds  Problems  Med Hx  Surg Hx  Fam Hx      Review of Systems:  No other skin or systemic complaints except as noted in HPI or Assessment and Plan.  Objective  Well appearing patient in no apparent distress; mood and affect are within normal limits.  A focused examination was performed including right occipital scalp. Relevant physical exam findings are noted in the Assessment and Plan.  right occipital scalp Keratotic pink plaque  dorsum nose Pink pearly papule or plaque with arborizing vessels.     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  Destruction of lesion  Destruction method: electrodesiccation and curettage   Informed consent: discussed and consent obtained   Timeout:  patient name, date of birth, surgical site, and procedure verified Anesthesia: the lesion was anesthetized in a standard fashion   Anesthetic:  1% lidocaine w/ epinephrine 1-100,000 buffered w/ 8.4% NaHCO3 Curettage performed in three different directions: Yes   Electrodesiccation performed over the curetted area: Yes   Curettage cycles:  3 Final wound size (cm):  3.1 Hemostasis achieved with:  electrodesiccation Outcome: patient tolerated procedure well with no complications   Post-procedure details: sterile dressing applied and wound care instructions given   Dressing type: petrolatum    Discussed options including Mohs surgery. Pt prefers ED&C.  Basal cell carcinoma (BCC) of skin of nose dorsum  nose  Destruction of lesion  Destruction method: electrodesiccation and curettage   Informed consent: discussed and consent obtained   Timeout:  patient name, date of birth, surgical site, and procedure verified Anesthesia: the lesion was anesthetized in a standard fashion   Anesthetic:  1% lidocaine w/ epinephrine 1-100,000 buffered w/ 8.4% NaHCO3 Curettage performed in three different directions: Yes   Electrodesiccation performed over the curetted area: Yes   Curettage cycles:  3 Final wound size (cm):  1.4 Hemostasis achieved with:  electrodesiccation Outcome: patient tolerated procedure well with no complications   Post-procedure details: sterile dressing applied and wound care instructions given   Dressing type: petrolatum    Discussed options at length including Mohs surgery, radiation, ED&C.  Pt and wife prefer ED&C.    Return in about 3 months (around 10/16/2022) for TBSE, hx of BCC, hx of SCCis .  I, Marye Round, CMA, am acting as scribe for Forest Gleason, MD .   Documentation: I have reviewed the above documentation for accuracy and completeness, and I agree with the above.  Forest Gleason, MD

## 2022-07-17 NOTE — Patient Instructions (Signed)
Wound Care Instructions  Cleanse wound gently with soap and water once a day then pat dry with clean gauze. Apply a thin coat of Petrolatum (petroleum jelly, "Vaseline") over the wound (unless you have an allergy to this). We recommend that you use a new, sterile tube of Vaseline. Do not pick or remove scabs. Do not remove the yellow or white "healing tissue" from the base of the wound.  Cover the wound with fresh, clean, nonstick gauze and secure with paper tape. You may use Band-Aids in place of gauze and tape if the wound is small enough, but would recommend trimming much of the tape off as there is often too much. Sometimes Band-Aids can irritate the skin.  You should call the office for your biopsy report after 1 week if you have not already been contacted.  If you experience any problems, such as abnormal amounts of bleeding, swelling, significant bruising, significant pain, or evidence of infection, please call the office immediately.  FOR ADULT SURGERY PATIENTS: If you need something for pain relief you may take 1 extra strength Tylenol (acetaminophen) AND 2 Ibuprofen (200mg each) together every 4 hours as needed for pain. (do not take these if you are allergic to them or if you have a reason you should not take them.) Typically, you may only need pain medication for 1 to 3 days.     Due to recent changes in healthcare laws, you may see results of your pathology and/or laboratory studies on MyChart before the doctors have had a chance to review them. We understand that in some cases there may be results that are confusing or concerning to you. Please understand that not all results are received at the same time and often the doctors may need to interpret multiple results in order to provide you with the best plan of care or course of treatment. Therefore, we ask that you please give us 2 business days to thoroughly review all your results before contacting the office for clarification. Should  we see a critical lab result, you will be contacted sooner.   If You Need Anything After Your Visit  If you have any questions or concerns for your doctor, please call our main line at [phone] and press option 4 to reach your doctor's medical assistant. If no one answers, please leave a voicemail as directed and we will return your call as soon as possible. Messages left after 4 pm will be answered the following business day.   You may also send us a message via MyChart. We typically respond to MyChart messages within 1-2 business days.  For prescription refills, please ask your pharmacy to contact our office. Our fax number is [phone].  If you have an urgent issue when the clinic is closed that cannot wait until the next business day, you can page your doctor at the number below.    Please note that while we do our best to be available for urgent issues outside of office hours, we are not available 24/7.   If you have an urgent issue and are unable to reach us, you may choose to seek medical care at your doctor's office, retail clinic, urgent care center, or emergency room.  If you have a medical emergency, please immediately call 911 or go to the emergency department.  Pager Numbers  - Dr. Kowalski: [phone]  - Dr. Moye: [phone]  - Dr. Stewart: [phone]  In the event of inclement weather, please call our main line at   [phone] for an update on the status of any delays or closures.  Dermatology Medication Tips: Please keep the boxes that topical medications come in in order to help keep track of the instructions about where and how to use these. Pharmacies typically print the medication instructions only on the boxes and not directly on the medication tubes.   If your medication is too expensive, please contact our office at [phone] option 4 or send us a message through MyChart.   We are unable to tell what your co-pay for medications will be in  advance as this is different depending on your insurance coverage. However, we may be able to find a substitute medication at lower cost or fill out paperwork to get insurance to cover a needed medication.   If a prior authorization is required to get your medication covered by your insurance company, please allow us 1-2 business days to complete this process.  Drug prices often vary depending on where the prescription is filled and some pharmacies may offer cheaper prices.  The website www.goodrx.com contains coupons for medications through different pharmacies. The prices here do not account for what the cost may be with help from insurance (it may be cheaper with your insurance), but the website can give you the price if you did not use any insurance.  - You can print the associated coupon and take it with your prescription to the pharmacy.  - You may also stop by our office during regular business hours and pick up a GoodRx coupon card.  - If you need your prescription sent electronically to a different pharmacy, notify our office through [facility] MyChart or by phone at [phone] option 4.     Si Usted Necesita Algo Despus de Su Visita  Tambin puede enviarnos un mensaje a travs de MyChart. Por lo general respondemos a los mensajes de MyChart en el transcurso de 1 a 2 das hbiles.  Para renovar recetas, por favor pida a su farmacia que se ponga en contacto con nuestra oficina. Nuestro nmero de fax es el [phone].  Si tiene un asunto urgente cuando la clnica est cerrada y que no puede esperar hasta el siguiente da hbil, puede llamar/localizar a su doctor(a) al nmero que aparece a continuacin.   Por favor, tenga en cuenta que aunque hacemos todo lo posible para estar disponibles para asuntos urgentes fuera del horario de oficina, no estamos disponibles las 24 horas del da, los 7 das de la semana.   Si tiene un problema urgente y no puede comunicarse con nosotros, puede  optar por buscar atencin mdica  en el consultorio de su doctor(a), en una clnica privada, en un centro de atencin urgente o en una sala de emergencias.  Si tiene una emergencia mdica, por favor llame inmediatamente al 911 o vaya a la sala de emergencias.  Nmeros de bper  - Dr. Kowalski: [phone]  - Dra. Moye: [phone]  - Dra. Stewart: [phone]  En caso de inclemencias del tiempo, por favor llame a nuestra lnea principal al [phone] para una actualizacin sobre el estado de cualquier retraso o cierre.  Consejos para la medicacin en dermatologa: Por favor, guarde las cajas en las que vienen los medicamentos de uso tpico para ayudarle a seguir las instrucciones sobre dnde y cmo usarlos. Las farmacias generalmente imprimen las instrucciones del medicamento slo en las cajas y no directamente en los tubos del medicamento.   Si su medicamento es muy caro, por favor, pngase en contacto con   nuestra oficina llamando al [phone] y presione la opcin 4 o envenos un mensaje a travs de MyChart.   No podemos decirle cul ser su copago por los medicamentos por adelantado ya que esto es diferente dependiendo de la cobertura de su seguro. Sin embargo, es posible que podamos encontrar un medicamento sustituto a menor costo o llenar un formulario para que el seguro cubra el medicamento que se considera necesario.   Si se requiere una autorizacin previa para que su compaa de seguros cubra su medicamento, por favor permtanos de 1 a 2 das hbiles para completar este proceso.  Los precios de los medicamentos varan con frecuencia dependiendo del lugar de dnde se surte la receta y alguna farmacias pueden ofrecer precios ms baratos.  El sitio web www.goodrx.com tiene cupones para medicamentos de diferentes farmacias. Los precios aqu no tienen en cuenta lo que podra costar con la ayuda del seguro (puede ser ms barato con su seguro), pero el sitio web puede darle el  precio si no utiliz ningn seguro.  - Puede imprimir el cupn correspondiente y llevarlo con su receta a la farmacia.  - Tambin puede pasar por nuestra oficina durante el horario de atencin regular y recoger una tarjeta de cupones de GoodRx.  - Si necesita que su receta se enve electrnicamente a una farmacia diferente, informe a nuestra oficina a travs de MyChart de [facility] o por telfono llamando al [phone] y presione la opcin 4.  

## 2022-07-24 ENCOUNTER — Ambulatory Visit: Payer: Medicare Other

## 2022-07-24 DIAGNOSIS — R2689 Other abnormalities of gait and mobility: Secondary | ICD-10-CM

## 2022-07-24 DIAGNOSIS — R262 Difficulty in walking, not elsewhere classified: Secondary | ICD-10-CM

## 2022-07-24 DIAGNOSIS — M6281 Muscle weakness (generalized): Secondary | ICD-10-CM

## 2022-07-24 NOTE — Therapy (Signed)
OUTPATIENT PHYSICAL THERAPY TREATMENT NOTE   Patient Name: Dylan Weber MRN: [id number] DOB:07/19/1933, 88 y.o., male Today's Date: 07/24/2022  PCP: Lang Snow, NP REFERRING PROVIDER: Ottie Glazier MD   PT End of Session - 07/24/22 1418     Visit Number 25    Number of Visits 30    Date for PT Re-Evaluation 08/29/22    Authorization Time Period 03/13/2022-06/05/2022; 06/06/2022- 08/29/2022    Progress Note Due on Visit 55    PT Start Time 1429    PT Stop Time 1514    PT Time Calculation (min) 45 min    Equipment Utilized During Treatment Gait belt    Activity Tolerance Patient tolerated treatment well    Behavior During Therapy WFL for tasks assessed/performed                     Past Medical History:  Diagnosis Date   COPD (chronic obstructive pulmonary disease) ([place])    Coronary artery calcification seen on CT scan 11/17/2021   COVID-19 22/4825   Diastolic dysfunction    a. 11/2021 Echo: EF 55-60%, no rwma, GrI DD, nl RV fxn. Mild AI.   GERD (gastroesophageal reflux disease)    Pulmonary embolism ([place])    a. 11/2021 CTA Chest: small peripheral PE in RLL branch of PA-->Eliquis.   Right leg DVT ([place])    a. 11/2021 LE U/S: occlusive R calf vein DVT in R peroneal vein-->eliquis.   SVT (supraventricular tachycardia) ([place])    a. Dx 10/2021 in setting of COVID infxn.   Past Surgical History:  Procedure Laterality Date   APPENDECTOMY     CHOLECYSTECTOMY     Patient Active Problem List   Diagnosis Date Noted   CAD (coronary artery disease) 12/31/2021   Near syncope 11/24/2021   Pulmonary embolism ([place]) 11/24/2021   Elevated troponin 11/24/2021   Hypoxia 11/01/2021   Acute on chronic respiratory failure with hypoxia ([facility])    SVT (supraventricular tachycardia) ([facility])    COPD exacerbation ([facility])    Gastroesophageal reflux disease without esophagitis    COVID-19 virus infection 10/31/2021    REFERRING DIAG: Muscle weakness (generalized)   THERAPY DIAG:   Difficulty in walking, not elsewhere classified  Muscle weakness (generalized)  Other abnormalities of gait and mobility  Rationale for Evaluation and Treatment Rehabilitation  PERTINENT HISTORY: 88 year old male with referral for general muscle weakness. He has COPD and recent bout of COVID in Dec 2022 and reports progressive weakness since. He does endorse some ongoing right hip pain with recent injection with no improvement. Patient has 2L O2 as needed but states has not needed lately.   PRECAUTIONS: falls  SUBJECTIVE: Patient reports the injection didn't help his pain in his hip. No pain with sitting, pain with standing/walking.   PAIN:  Are you having pain? No     TODAY'S TREATMENT:   There.ex: Nustep Lvl 4-6 RPM > 60 for cardiovascular challenge; 5 minutes  6" step: -toe taps 20x each LE no UE support -step up 10x each side.  Sit to stand 1x10 no resistance. 2x8 due to fatigue. VC's for anterior trunk lean and scooting torso to edge of seat to assist in standing. SBA.  Sit to stand cross body punch 10x; pain in butt  Seated sciatic nerve glides 15x  Seated figure four stretch 30 seconds each LE   Walk with 3lb ankle weights 150 ft  Seated LAQ 3lb ankle weights 10x 5 second holds each LE Seated alternating  ER/IR with 3lb ankle weights 12x each side stepping over hedgehog.  Seated heel raise 15x with 3lb ankle weight  Seate rest b/t STS exercises due to SPO2: 88% and increased WOB. Returns > 90% with seated rest.   Neuromuscular re-ed: Walk with ball toss vertical 150 ft Walk with ball bounce 150 ft  Step over half foam roller 10x each LE  On half foam roller: -static stand 30 seconds -dorsiflexion/plantarflexion 10x each LE  -round side up: tandem stance 30 seconds each LE    Education provided throughout session via VC/TC and demonstration to facilitate movement at target joints and correct muscle activation for all testing and exercises performed.       PATIENT EDUCATION: Education details: Exercise Technique Person educated: Patient Education method: Explanation, Demonstration, Tactile cues, and Verbal cues Education comprehension: verbalized understanding, returned demonstration, verbal cues required, tactile cues required, and needs further education   HOME EXERCISE PROGRAM:   Access Code: [email or web address] URL: [email or web address] Date: 06/06/2022 Prepared by: Janna Arch  Program Notes **Be sure to perform all exercises next to a countertop or sturdy piece of furniture in case you become unsteady.  Exercises - Standing Heel Raise with Support  - 1 x daily - 7 x weekly - 2 sets - 15 reps - Standing Toe Raises at Chair  - 1 x daily - 7 x weekly - 2 sets - 15 reps - Mini Squat with Counter Support  - 1 x daily - 7 x weekly - 2 sets - 15 reps - Standing Tandem Balance with Counter Support  - 1 x daily - 7 x weekly - 2 sets - 2 reps - 30 hold - Standing Single Leg Stance with Counter Support  - 1 x daily - 7 x weekly - 2 sets - 2 reps - 30 hold   PT Short Term Goals -      PT SHORT TERM GOAL #1   Title Pt will be independent with initial HEP in order to improve strength and balance in order to decrease fall risk and improve function at home and work    Baseline 03/13/2022= Patient presents with no formal HEP in place; 04/19/2022=Patient reports he is doing his bike riding and torso twist along with walking.  7/26: compliant; 06/19/2022= Patient reports compliant with HEP including m   Time 6    Period Weeks    Status On-going    Target Date MET     PT SHORT TERM GOAL #2   Title Patient will be iindependent with knowledge and performance of pursed lip breathing for imroved recovery of breathing.    Baseline 03/13/2022- Patient with limited knowledge of how to recover from shortness of breath. 04/19/2022-Patient verbalized and demonstrates independence with pursed lip breathing technique.    Time 6    Period Weeks     Status Achieved    Target Date 04/24/22              PT Long Term Goals -      PT LONG TERM GOAL #1   Title Pt will be independent with Final HEP in order to improve strength and balance in order to decrease fall risk and improve function at home and work    Baseline 03/13/2022= Patient presents with no formal HEP in place 7/26: HEP compliant   Time 12    Period Weeks    Status Achieved   Target Date 06/05/22      PT LONG TERM GOAL #2  Title Pt will improve FOTO to target score of 66 to display perceived improvements in ability to complete ADL's.    Baseline 03/13/2022=57: 04/19/2022=66 7/26:80%    Time 12    Period Weeks    Status MET    Target Date 06/05/22      PT LONG TERM GOAL #3   Title Pt will decrease 5TSTS by at least 2 seconds inlcuding ability to perform with LE only in order to demonstrate clinically significant improvement in LE strength.    Baseline 03/13/2022= 15.12 sec with BUE Support; 04/19/2022=19.14 sec with hands on knees (minimal UE support).  7/26: 15.7 seconds with hands on knees   Time 12    Period Weeks    Status On-going    Target Date 08/29/2022       PT LONG TERM GOAL #4   Title Pt will increase 6MWT by at least 65m(1672f- maintaining > 90% O2 sat in order to demonstrate clinically significant improvement in cardiopulmonary endurance and community ambulation    Baseline 03/13/2022= 970 feet using no AD; 04/19/2022= 1050 feet = 90%SpO2  7/26: 990 ft with SP02 90%   Time 12    Period Weeks    Status On-going    Target Date 08/29/2022          PT LONG TERM GOAL #  Title  Patient will increase Functional Gait Assessment score to >20/30 as to reduce fall risk and improve dynamic gait safety with community ambulation.  Baseline 7/26: 13/30  Time 12   Period Weeks   Status New  Target Date 08/29/2022            Plan -     Clinical Impression Statement Patient has intermittent pain in buttocks requiring modification of interventions. He is  highly motivated throughout session despite shortness of breath and pain. He continues to be limited by sit to stand requiring UE support this session for Single UE.  Patient will continue to benefit from skilled physical therapy interventions in order to improve endurance, balance, in order to help him return to previous level of independence with community activities.     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    PT Home Exercise Plan Instructed in walking program using RPE scale and use of pulse ox    Consulted and Agree with Plan of Care Patient              MaJanna ArchT  Physical Therapist- [facility]9/10/2022, 3:15 PM

## 2022-07-29 ENCOUNTER — Encounter: Payer: Self-pay | Admitting: Dermatology

## 2022-07-31 ENCOUNTER — Ambulatory Visit: Payer: Medicare Other

## 2022-07-31 DIAGNOSIS — R2689 Other abnormalities of gait and mobility: Secondary | ICD-10-CM

## 2022-07-31 DIAGNOSIS — M6281 Muscle weakness (generalized): Secondary | ICD-10-CM

## 2022-07-31 DIAGNOSIS — R262 Difficulty in walking, not elsewhere classified: Secondary | ICD-10-CM | POA: Diagnosis not present

## 2022-07-31 NOTE — Therapy (Signed)
OUTPATIENT PHYSICAL THERAPY TREATMENT NOTE   Patient Name: Dylan Weber MRN: [id number] DOB:1933-10-02, 88 y.o., male Today's Date: 07/31/2022  PCP: Lang Snow, NP REFERRING PROVIDER: Ottie Glazier MD   PT End of Session - 07/31/22 1349     Visit Number 26    Number of Visits 30    Date for PT Re-Evaluation 08/29/22    Authorization Time Period 03/13/2022-06/05/2022; 06/06/2022- 08/29/2022    Progress Note Due on Visit 55    PT Start Time 1345    PT Stop Time 1429    PT Time Calculation (min) 44 min    Equipment Utilized During Treatment Gait belt    Activity Tolerance Patient tolerated treatment well    Behavior During Therapy WFL for tasks assessed/performed                      Past Medical History:  Diagnosis Date   COPD (chronic obstructive pulmonary disease) ([place])    Coronary artery calcification seen on CT scan 11/17/2021   COVID-19 15/4008   Diastolic dysfunction    a. 11/2021 Echo: EF 55-60%, no rwma, GrI DD, nl RV fxn. Mild AI.   GERD (gastroesophageal reflux disease)    Pulmonary embolism ([place])    a. 11/2021 CTA Chest: small peripheral PE in RLL branch of PA-->Eliquis.   Right leg DVT ([place])    a. 11/2021 LE U/S: occlusive R calf vein DVT in R peroneal vein-->eliquis.   SVT (supraventricular tachycardia) ([place])    a. Dx 10/2021 in setting of COVID infxn.   Past Surgical History:  Procedure Laterality Date   APPENDECTOMY     CHOLECYSTECTOMY     Patient Active Problem List   Diagnosis Date Noted   CAD (coronary artery disease) 12/31/2021   Near syncope 11/24/2021   Pulmonary embolism ([place]) 11/24/2021   Elevated troponin 11/24/2021   Hypoxia 11/01/2021   Acute on chronic respiratory failure with hypoxia ([facility])    SVT (supraventricular tachycardia) ([facility])    COPD exacerbation ([facility])    Gastroesophageal reflux disease without esophagitis    COVID-19 virus infection 10/31/2021    REFERRING DIAG: Muscle weakness (generalized)   THERAPY DIAG:   Difficulty in walking, not elsewhere classified  Muscle weakness (generalized)  Other abnormalities of gait and mobility  Rationale for Evaluation and Treatment Rehabilitation  PERTINENT HISTORY: 88 year old male with referral for general muscle weakness. He has COPD and recent bout of COVID in Dec 2022 and reports progressive weakness since. He does endorse some ongoing right hip pain with recent injection with no improvement. Patient has 2L O2 as needed but states has not needed lately.   PRECAUTIONS: falls  SUBJECTIVE: Patient reports he is feeling ready for discharge.   PAIN:  Are you having pain? No     TODAY'S TREATMENT:   There.ex: Nustep Lvl 4-6 RPM > 60 for cardiovascular challenge; 5 minutes  Squat 15x with bar  Three way hip hike 10x each LE, SUE support Lateral step over hedgehog seated 15 x each LE    Neuromuscular re-ed: Airex balance beam:  -lateral stepping 6 xlength -tandem walk 6x length of // bars  ambulate across stable and unstable surface outside. Negotiating changing surfaces from grass to sidewalk, across brick with turns and obstacles in pathway without LOB.  Walk on grass: close CGA -forward/backwards 60 ft -lateral R/L 60 ft -grapevine 30 ft     Education provided throughout session via VC/TC and demonstration to facilitate movement at target  joints and correct muscle activation for all testing and exercises performed.      PATIENT EDUCATION: Education details: Exercise Technique Person educated: Patient Education method: Explanation, Demonstration, Tactile cues, and Verbal cues Education comprehension: verbalized understanding, returned demonstration, verbal cues required, tactile cues required, and needs further education   HOME EXERCISE PROGRAM:   Access Code: [email or web address] URL: [email or web address] Date: 06/06/2022 Prepared by: Janna Arch  Program Notes **Be sure to perform all exercises next to a countertop  or sturdy piece of furniture in case you become unsteady.  Exercises - Standing Heel Raise with Support  - 1 x daily - 7 x weekly - 2 sets - 15 reps - Standing Toe Raises at Chair  - 1 x daily - 7 x weekly - 2 sets - 15 reps - Mini Squat with Counter Support  - 1 x daily - 7 x weekly - 2 sets - 15 reps - Standing Tandem Balance with Counter Support  - 1 x daily - 7 x weekly - 2 sets - 2 reps - 30 hold - Standing Single Leg Stance with Counter Support  - 1 x daily - 7 x weekly - 2 sets - 2 reps - 30 hold   PT Short Term Goals -      PT SHORT TERM GOAL #1   Title Pt will be independent with initial HEP in order to improve strength and balance in order to decrease fall risk and improve function at home and work    Baseline 03/13/2022= Patient presents with no formal HEP in place; 04/19/2022=Patient reports he is doing his bike riding and torso twist along with walking.  7/26: compliant; 06/19/2022= Patient reports compliant with HEP including m   Time 6    Period Weeks    Status On-going    Target Date MET     PT SHORT TERM GOAL #2   Title Patient will be iindependent with knowledge and performance of pursed lip breathing for imroved recovery of breathing.    Baseline 03/13/2022- Patient with limited knowledge of how to recover from shortness of breath. 04/19/2022-Patient verbalized and demonstrates independence with pursed lip breathing technique.    Time 6    Period Weeks    Status Achieved    Target Date 04/24/22              PT Long Term Goals -      PT LONG TERM GOAL #1   Title Pt will be independent with Final HEP in order to improve strength and balance in order to decrease fall risk and improve function at home and work    Baseline 03/13/2022= Patient presents with no formal HEP in place 7/26: HEP compliant   Time 12    Period Weeks    Status Achieved   Target Date 06/05/22      PT LONG TERM GOAL #2   Title Pt will improve FOTO to target score of 66 to display perceived  improvements in ability to complete ADL's.    Baseline 03/13/2022=57: 04/19/2022=66 7/26:80%    Time 12    Period Weeks    Status MET    Target Date 06/05/22      PT LONG TERM GOAL #3   Title Pt will decrease 5TSTS by at least 2 seconds inlcuding ability to perform with LE only in order to demonstrate clinically significant improvement in LE strength.    Baseline 03/13/2022= 15.12 sec with BUE Support; 04/19/2022=19.14 sec with hands on knees (minimal UE  support).  7/26: 15.7 seconds with hands on knees   Time 12    Period Weeks    Status On-going    Target Date 08/29/2022       PT LONG TERM GOAL #4   Title Pt will increase 6MWT by at least 67m(1658f- maintaining > 90% O2 sat in order to demonstrate clinically significant improvement in cardiopulmonary endurance and community ambulation    Baseline 03/13/2022= 970 feet using no AD; 04/19/2022= 1050 feet = 90%SpO2  7/26: 990 ft with SP02 90%   Time 12    Period Weeks    Status On-going    Target Date 08/29/2022          PT LONG TERM GOAL #  Title  Patient will increase Functional Gait Assessment score to >20/30 as to reduce fall risk and improve dynamic gait safety with community ambulation.  Baseline 7/26: 13/30  Time 12   Period Weeks   Status New  Target Date 08/29/2022            Plan -     Clinical Impression Statement Will potentially discharge next session. Will perform goals and have spouse present for discussion of discharge.  Patient will continue to benefit from skilled physical therapy interventions in order to improve endurance, balance, in order to help him return to previous level of independence with community activities.     Personal Factors and Comorbidities Age;Comorbidity 2    Comorbidities COPD; HTN    Examination-Activity Limitations Bend;Carry;Lift;Squat;Other;Stand    Examination-Participation Restrictions Cleaning;Community Activity;Yard Work    [employer] officervolving/Moderate  complexity    Rehab Potential Good    PT Frequency 2x / week    PT Duration 12 weeks    PT Treatment/Interventions ADLs/Self Care Home Management;Canalith Repostioning;Cryotherapy;Electrical Stimulation;Moist Heat;DME Instruction;Gait training;Stair training;Functional mobility training;Therapeutic activities;Therapeutic exercise;Balance training;Neuromuscular re-education;Patient/family education;Manual techniques;Passive range of motion;Dry needling;Vestibular    PT Next Visit Plan Review Pursed lip breathing and add hip strengthening, functional endurance based activities.    PT Home Exercise Plan Instructed in walking program using RPE scale and use of pulse ox    Consulted and Agree with Plan of Care Patient              MaJanna ArchT  Physical Therapist- [facility]9/19/2023, 2:29 PM

## 2022-08-06 NOTE — Therapy (Signed)
OUTPATIENT PHYSICAL THERAPY TREATMENT NOTE/discharge    Patient Name: Dylan Weber MRN: [id number] DOB:Jan 31, 1933, 88 y.o., male Today's Date: 08/07/2022  PCP: Lang Snow, NP REFERRING PROVIDER: Ottie Glazier MD   PT End of Session - 08/07/22 1338     Visit Number 27    Number of Visits 30    Date for PT Re-Evaluation 08/29/22    Authorization Time Period 03/13/2022-06/05/2022; 06/06/2022- 08/29/2022    Progress Note Due on Visit 56    PT Start Time 1342    PT Stop Time 1420    PT Time Calculation (min) 38 min    Equipment Utilized During Treatment Gait belt    Activity Tolerance Patient tolerated treatment well    Behavior During Therapy WFL for tasks assessed/performed                       Past Medical History:  Diagnosis Date   COPD (chronic obstructive pulmonary disease) ([place])    Coronary artery calcification seen on CT scan 11/17/2021   COVID-19 18/8416   Diastolic dysfunction    a. 11/2021 Echo: EF 55-60%, no rwma, GrI DD, nl RV fxn. Mild AI.   GERD (gastroesophageal reflux disease)    Pulmonary embolism ([place])    a. 11/2021 CTA Chest: small peripheral PE in RLL branch of PA-->Eliquis.   Right leg DVT ([place])    a. 11/2021 LE U/S: occlusive R calf vein DVT in R peroneal vein-->eliquis.   SVT (supraventricular tachycardia) ([place])    a. Dx 10/2021 in setting of COVID infxn.   Past Surgical History:  Procedure Laterality Date   APPENDECTOMY     CHOLECYSTECTOMY     Patient Active Problem List   Diagnosis Date Noted   CAD (coronary artery disease) 12/31/2021   Near syncope 11/24/2021   Pulmonary embolism ([place]) 11/24/2021   Elevated troponin 11/24/2021   Hypoxia 11/01/2021   Acute on chronic respiratory failure with hypoxia (H[facility])    SVT (supraventricular tachycardia) ([facility])    COPD exacerbation ([facility])    Gastroesophageal reflux disease without esophagitis    COVID-19 virus infection 10/31/2021    REFERRING DIAG: Muscle weakness (generalized)    THERAPY DIAG:  Difficulty in walking, not elsewhere classified  Muscle weakness (generalized)  Other abnormalities of gait and mobility  Rationale for Evaluation and Treatment Rehabilitation  PERTINENT HISTORY: 88 year old male with referral for general muscle weakness. He has COPD and recent bout of COVID in Dec 2022 and reports progressive weakness since. He does endorse some ongoing right hip pain with recent injection with no improvement. Patient has 2L O2 as needed but states has not needed lately.   PRECAUTIONS: falls  SUBJECTIVE: Patient aware today will be discharge day. Presents with wife.   PAIN:  Are you having pain? No     TODAY'S TREATMENT:   Goals assessed: please refer below for further details.   PATIENT EDUCATION: Education details: Exercise Technique Person educated: Patient Education method: Explanation, Demonstration, Tactile cues, and Verbal cues Education comprehension: verbalized understanding, returned demonstration, verbal cues required, tactile cues required, and needs further education   HOME EXERCISE PROGRAM:   Access Code: [email or web address] URL: [email or web address] Date: 06/06/2022 Prepared by: Janna Arch  Program Notes **Be sure to perform all exercises next to a countertop or sturdy piece of furniture in case you become unsteady.  Exercises - Standing Heel Raise with Support  - 1 x daily - 7 x weekly - 2 sets - 15 reps - Standing  Toe Raises at Chair  - 1 x daily - 7 x weekly - 2 sets - 15 reps - Mini Squat with Counter Support  - 1 x daily - 7 x weekly - 2 sets - 15 reps - Standing Tandem Balance with Counter Support  - 1 x daily - 7 x weekly - 2 sets - 2 reps - 30 hold - Standing Single Leg Stance with Counter Support  - 1 x daily - 7 x weekly - 2 sets - 2 reps - 30 hold   PT Short Term Goals -      PT SHORT TERM GOAL #1   Title Pt will be independent with initial HEP in order to improve strength and balance in order  to decrease fall risk and improve function at home and work    Baseline 03/13/2022= Patient presents with no formal HEP in place; 04/19/2022=Patient reports he is doing his bike riding and torso twist along with walking.  7/26: compliant; 06/19/2022= Patient reports compliant with HEP including m   Time 6    Period Weeks    Status On-going    Target Date MET     PT SHORT TERM GOAL #2   Title Patient will be iindependent with knowledge and performance of pursed lip breathing for imroved recovery of breathing.    Baseline 03/13/2022- Patient with limited knowledge of how to recover from shortness of breath. 04/19/2022-Patient verbalized and demonstrates independence with pursed lip breathing technique.    Time 6    Period Weeks    Status Achieved    Target Date 04/24/22              PT Long Term Goals -      PT LONG TERM GOAL #1   Title Pt will be independent with Final HEP in order to improve strength and balance in order to decrease fall risk and improve function at home and work    Baseline 03/13/2022= Patient presents with no formal HEP in place 7/26: HEP compliant   Time 12    Period Weeks    Status Achieved   Target Date 06/05/22      PT LONG TERM GOAL #2   Title Pt will improve FOTO to target score of 66 to display perceived improvements in ability to complete ADL's.    Baseline 03/13/2022=57: 04/19/2022=66 7/26:80% 9/26: 76%    Time 12    Period Weeks    Status MET    Target Date 06/05/22      PT LONG TERM GOAL #3   Title Pt will decrease 5TSTS by at least 2 seconds inlcuding ability to perform with LE only in order to demonstrate clinically significant improvement in LE strength.    Baseline 03/13/2022= 15.12 sec with BUE Support; 04/19/2022=19.14 sec with hands on knees (minimal UE support).  7/26: 15.7 seconds with hands on knees 9/26: 11.78 seconds    Time 12    Period Weeks    Status MET   Target Date 08/29/2022       PT LONG TERM GOAL #4   Title Pt will increase 6MWT by at  least 50m(1681f- maintaining > 90% O2 sat in order to demonstrate clinically significant improvement in cardiopulmonary endurance and community ambulation    Baseline 03/13/2022= 970 feet using no AD; 04/19/2022= 1050 feet = 90%SpO2  7/26: 990 ft with SP02 90% 9/26: 1140 ft no AD Sp02 90%    Time 12    Period Weeks  Status MET   Target Date 08/29/2022          PT LONG TERM GOAL #  Title  Patient will increase Functional Gait Assessment score to >20/30 as to reduce fall risk and improve dynamic gait safety with community ambulation.  Baseline 7/26: 13/30 9/26: 26/30  Time 12   Period Weeks   Status MET  Target Date 08/29/2022            Plan -     Clinical Impression Statement Patient  has met goals and is prepared to discharge. I will be happy to treat this patient again in the future as needed. Discussed with patient and patient's spouse about continued need for compliance with HEP with both verbalizing understanding.     Personal Factors and Comorbidities Age;Comorbidity 2    Comorbidities COPD; HTN    Examination-Activity Limitations Bend;Carry;Lift;Squat;Other;Stand    Examination-Participation Restrictions Cleaning;Community Activity;Yard Work    Merchant navy officer Evolving/Moderate complexity    Rehab Potential Good    PT Frequency 2x / week    PT Duration 12 weeks    PT Treatment/Interventions ADLs/Self Care Home Management;Canalith Repostioning;Cryotherapy;Electrical Stimulation;Moist Heat;DME Instruction;Gait training;Stair training;Functional mobility training;Therapeutic activities;Therapeutic exercise;Balance training;Neuromuscular re-education;Patient/family education;Manual techniques;Passive range of motion;Dry needling;Vestibular    PT Next Visit Plan Review Pursed lip breathing and add hip strengthening, functional endurance based activities.    PT Home Exercise Plan Instructed in walking program using RPE scale and use of pulse ox    Consulted  and Agree with Plan of Care Patient              Janna Arch PT  Physical Therapist- [facility]  08/07/2022, 2:28 PM

## 2022-08-07 ENCOUNTER — Ambulatory Visit: Payer: Medicare Other

## 2022-08-07 DIAGNOSIS — R262 Difficulty in walking, not elsewhere classified: Secondary | ICD-10-CM

## 2022-08-07 DIAGNOSIS — R2689 Other abnormalities of gait and mobility: Secondary | ICD-10-CM

## 2022-08-07 DIAGNOSIS — M6281 Muscle weakness (generalized): Secondary | ICD-10-CM

## 2022-08-14 ENCOUNTER — Ambulatory Visit: Payer: Medicare Other

## 2022-08-21 ENCOUNTER — Ambulatory Visit: Payer: Medicare Other

## 2022-08-28 ENCOUNTER — Ambulatory Visit: Payer: Medicare Other

## 2022-09-04 ENCOUNTER — Ambulatory Visit: Payer: Medicare Other

## 2022-09-05 ENCOUNTER — Encounter: Payer: Self-pay | Admitting: Cardiovascular Disease

## 2022-09-11 ENCOUNTER — Ambulatory Visit: Payer: Medicare Other

## 2022-09-18 ENCOUNTER — Ambulatory Visit: Payer: Medicare Other

## 2022-09-26 ENCOUNTER — Encounter: Payer: Medicare Other | Admitting: Dermatology

## 2022-10-09 ENCOUNTER — Ambulatory Visit: Payer: Medicare Other

## 2022-10-11 ENCOUNTER — Encounter: Payer: Self-pay | Admitting: Dermatology

## 2022-10-11 ENCOUNTER — Ambulatory Visit (INDEPENDENT_AMBULATORY_CARE_PROVIDER_SITE_OTHER): Payer: Medicare Other | Admitting: Dermatology

## 2022-10-11 DIAGNOSIS — D489 Neoplasm of uncertain behavior, unspecified: Secondary | ICD-10-CM | POA: Diagnosis not present

## 2022-10-11 DIAGNOSIS — L570 Actinic keratosis: Secondary | ICD-10-CM | POA: Diagnosis not present

## 2022-10-11 DIAGNOSIS — Z1283 Encounter for screening for malignant neoplasm of skin: Secondary | ICD-10-CM | POA: Diagnosis not present

## 2022-10-11 DIAGNOSIS — L720 Epidermal cyst: Secondary | ICD-10-CM | POA: Diagnosis not present

## 2022-10-11 DIAGNOSIS — S41112A Laceration without foreign body of left upper arm, initial encounter: Secondary | ICD-10-CM | POA: Diagnosis not present

## 2022-10-11 DIAGNOSIS — L821 Other seborrheic keratosis: Secondary | ICD-10-CM

## 2022-10-11 DIAGNOSIS — L578 Other skin changes due to chronic exposure to nonionizing radiation: Secondary | ICD-10-CM

## 2022-10-11 DIAGNOSIS — C4491 Basal cell carcinoma of skin, unspecified: Secondary | ICD-10-CM

## 2022-10-11 DIAGNOSIS — Z8589 Personal history of malignant neoplasm of other organs and systems: Secondary | ICD-10-CM

## 2022-10-11 DIAGNOSIS — Z85828 Personal history of other malignant neoplasm of skin: Secondary | ICD-10-CM | POA: Diagnosis not present

## 2022-10-11 DIAGNOSIS — B351 Tinea unguium: Secondary | ICD-10-CM

## 2022-10-11 DIAGNOSIS — B353 Tinea pedis: Secondary | ICD-10-CM

## 2022-10-11 DIAGNOSIS — D692 Other nonthrombocytopenic purpura: Secondary | ICD-10-CM

## 2022-10-11 DIAGNOSIS — D229 Melanocytic nevi, unspecified: Secondary | ICD-10-CM

## 2022-10-11 DIAGNOSIS — L814 Other melanin hyperpigmentation: Secondary | ICD-10-CM

## 2022-10-11 HISTORY — DX: Basal cell carcinoma of skin, unspecified: C44.91

## 2022-10-11 MED ORDER — CICLOPIROX OLAMINE 0.77 % EX SUSP: CUTANEOUS | 11 refills | Status: DC

## 2022-10-11 NOTE — Progress Notes (Signed)
Follow-Up Visit   Subjective  Dylan Weber is a 88 y.o. male who presents for the following: Annual Exam (Hx BCC, SCC ). The patient presents for Total-Body Skin Exam (TBSE) for skin cancer screening and mole check.  The patient has spots, moles and lesions to be evaluated, some may be new or changing.  The following portions of the chart were reviewed this encounter and updated as appropriate:   Tobacco  Allergies  Meds  Problems  Med Hx  Surg Hx  Fam Hx      Review of Systems:  No other skin or systemic complaints except as noted in HPI or Assessment and Plan.  Objective  Well appearing patient in no apparent distress; mood and affect are within normal limits.  A full examination was performed including scalp, head, eyes, ears, nose, lips, neck, chest, axillae, abdomen, back, buttocks, bilateral upper extremities, bilateral lower extremities, hands, feet, fingers, toes, fingernails, and toenails. All findings within normal limits unless otherwise noted below.  R nasal supratip 0.5 cm pink macule excoriated      R temple x 1 Erythematous thin papules/macules with gritty scale.   R occipital scalp Crusted erosion consistent with incompletely healed ED&C site.  L forearm Bleeding laceration   B/L feet and toenails Scale and toenail dystrophy.    Assessment & Plan  Neoplasm of uncertain behavior R nasal supratip  Skin / nail biopsy Type of biopsy: tangential   Informed consent: discussed and consent obtained   Timeout: patient name, date of birth, surgical site, and procedure verified   Procedure prep:  Patient was prepped and draped in usual sterile fashion Prep type:  Isopropyl alcohol Anesthesia: the lesion was anesthetized in a standard fashion   Anesthetic:  1% lidocaine w/ epinephrine 1-100,000 buffered w/ 8.4% NaHCO3 Instrument used: flexible razor blade   Hemostasis achieved with: pressure, aluminum chloride and electrodesiccation   Outcome: patient  tolerated procedure well   Post-procedure details: sterile dressing applied and wound care instructions given   Dressing type: bandage and petrolatum    Specimen 1 - Surgical pathology Differential Diagnosis: D48.5 r/o BCC vs other  Check Margins: No  R/O BCC vs other   AK (actinic keratosis) R temple x 1  Actinic keratoses are precancerous spots that appear secondary to cumulative UV radiation exposure/sun exposure over time. They are chronic with expected duration over 1 year. A portion of actinic keratoses will progress to squamous cell carcinoma of the skin. It is not possible to reliably predict which spots will progress to skin cancer and so treatment is recommended to prevent development of skin cancer.  Recommend daily broad spectrum sunscreen SPF 30+ to sun-exposed areas, reapply every 2 hours as needed.  Recommend staying in the shade or wearing long sleeves, sun glasses (UVA+UVB protection) and wide brim hats (4-inch brim around the entire circumference of the hat). Call for new or changing lesions.  Prior to procedure, discussed risks of blister formation, small wound, skin dyspigmentation, or rare scar following cryotherapy. Recommend Vaseline ointment to treated areas while healing.   Destruction of lesion - R temple x 1 Complexity: simple   Destruction method: cryotherapy   Informed consent: discussed and consent obtained   Timeout:  patient name, date of birth, surgical site, and procedure verified Lesion destroyed using liquid nitrogen: Yes   Region frozen until ice ball extended beyond lesion: Yes   Outcome: patient tolerated procedure well with no complications   Post-procedure details: wound care instructions given  History of squamous cell carcinoma R occipital scalp  Cleanse wound daily with warm water and soap, pat dry, then apply Aquaphor or Vaseline TID. Do this daily until healed.  Laceration of left upper extremity, initial encounter L  forearm  Wound cleansed today with Puracyn spray, Bleed Stop applied to aa, covered with non-stick gauze and Coban wrap. Clean once daily with warm water and soap. Apply Vaseline or Aquaphor and cover with bandage. Do this daily until healed. Recommend patient picking up OTC Bleed Stop or Wound Seal for future use.  Milium cyst Lat to L lat canthus  Symptomatic, irritated  Acne/Milia surgery - Lat to L lat canthus Procedure risks and benefits were discussed with the patient and verbal consent was obtained. Following prep of the skin on the lat to L lat canthus with an alcohol swab, extraction of comedones was performed with cotton tip applicators. Vaseline ointment was applied to each site. The patient tolerated the procedure well.  Tinea pedis of both feet B/L feet and toenails  With tinea unguium - KOH (+)    Chronic and persistent condition with duration over one year. Condition is bothersome/symptomatic for patient. Currently flared.  D/C Fluocinonide cream. Start OTC Lamisil BID until cleared. Then QW thereafter to prevent recurrence.  Start Ciclopirox solution QD-BID to toenails.   ciclopirox (LOPROX) 0.77 % SUSP - B/L feet and toenails Apply to toenails QD-BID.  Tinea pedis of left foot   Lentigines - Scattered tan macules - Due to sun exposure - Benign-appearing, observe - Recommend daily broad spectrum sunscreen SPF 30+ to sun-exposed areas, reapply every 2 hours as needed. - Call for any changes  Seborrheic Keratoses - Stuck-on, waxy, tan-brown papules and/or plaques  - Benign-appearing - Discussed benign etiology and prognosis. - Observe - Call for any changes  Melanocytic Nevi - Tan-brown and/or pink-flesh-colored symmetric macules and papules - Benign appearing on exam today - Observation - Call clinic for new or changing moles - Recommend daily use of broad spectrum spf 30+ sunscreen to sun-exposed areas.   Hemangiomas - Red papules - Discussed benign  nature - Observe - Call for any changes  Actinic Damage - Chronic condition, secondary to cumulative UV/sun exposure - diffuse scaly erythematous macules with underlying dyspigmentation - Recommend daily broad spectrum sunscreen SPF 30+ to sun-exposed areas, reapply every 2 hours as needed.  - Staying in the shade or wearing long sleeves, sun glasses (UVA+UVB protection) and wide brim hats (4-inch brim around the entire circumference of the hat) are also recommended for sun protection.  - Call for new or changing lesions.  History of Basal Cell Carcinoma of the Skin - No evidence of recurrence today - Recommend regular full body skin exams - Recommend daily broad spectrum sunscreen SPF 30+ to sun-exposed areas, reapply every 2 hours as needed.  - Call if any new or changing lesions are noted between office visits  Purpura - Chronic; persistent and recurrent.  Treatable, but not curable. - Violaceous macules and patches - Benign - Related to trauma, age, sun damage and/or use of blood thinners, chronic use of topical and/or oral steroids - Observe - Can use OTC arnica containing moisturizer such as Dermend Bruise Formula if desired - Call for worsening or other concerns  Skin cancer screening performed today.  Return in about 6 months (around 04/11/2023) for TBSE.  Luther Redo, CMA, am acting as scribe for Forest Gleason, MD .  Documentation: I have reviewed the above documentation for accuracy and  completeness, and I agree with the above.  Forest Gleason, MD

## 2022-10-11 NOTE — Patient Instructions (Addendum)
Start over the counter Terbinafine/Lamisil cream to the feet twice daily until clear. Then use once per week to prevent recurrence.   Start Ciclopirox solution paint on nails once to twice daily (sent to pharmacy).  Wound Care Instructions  Cleanse wound gently with soap and water once a day then pat dry with clean gauze. Apply a thin coat of Petrolatum (petroleum jelly, "Vaseline") over the wound (unless you have an allergy to this). We recommend that you use a new, sterile tube of Vaseline. Do not pick or remove scabs. Do not remove the yellow or white "healing tissue" from the base of the wound.  Cover the wound with fresh, clean, nonstick gauze and secure with paper tape. You may use Band-Aids in place of gauze and tape if the wound is small enough, but would recommend trimming much of the tape off as there is often too much. Sometimes Band-Aids can irritate the skin.  You should call the office for your biopsy report after 1 week if you have not already been contacted.  If you experience any problems, such as abnormal amounts of bleeding, swelling, significant bruising, significant pain, or evidence of infection, please call the office immediately.  FOR ADULT SURGERY PATIENTS: If you need something for pain relief you may take 1 extra strength Tylenol (acetaminophen) AND 2 Ibuprofen ('200mg'$  each) together every 4 hours as needed for pain. (do not take these if you are allergic to them or if you have a reason you should not take them.) Typically, you may only need pain medication for 1 to 3 days.     Due to recent changes in healthcare laws, you may see results of your pathology and/or laboratory studies on MyChart before the doctors have had a chance to review them. We understand that in some cases there may be results that are confusing or concerning to you. Please understand that not all results are received at the same time and often the doctors may need to interpret multiple results in  order to provide you with the best plan of care or course of treatment. Therefore, we ask that you please give [place] 2 business days to thoroughly review all your results before contacting the office for clarification. Should we see a critical lab result, you will be contacted sooner.   If You Need Anything After Your Visit  If you have any questions or concerns for your doctor, please call our main line at [phone] and press option 4 to reach your doctor's medical assistant. If no one answers, please leave a voicemail as directed and we will return your call as soon as possible. Messages left after 4 pm will be answered the following business day.   You may also send [place] a message via [place]. We typically respond to MyChart messages within 1-2 business days.  For prescription refills, please ask your pharmacy to contact our office. Our fax number is [phone].  If you have an urgent issue when the clinic is closed that cannot wait until the next business day, you can page your doctor at the number below.    Please note that while we do our best to be available for urgent issues outside of office hours, we are not available 24/7.   If you have an urgent issue and are unable to reach [place], you may choose to seek medical care at your doctor's office, retail clinic, urgent care center, or emergency room.  If you have a medical emergency, please immediately call 911  or go to the emergency department.  Pager Numbers  - Dr. Nehemiah Massed: [phone]  - Dr. Laurence Ferrari: [phone]  - Dr. Nicole Kindred: [phone]  In the event of inclement weather, please call our main line at [phone] for an update on the status of any delays or closures.  Dermatology Medication Tips: Please keep the boxes that topical medications come in in order to help keep track of the instructions about where and how to use these. Pharmacies typically print the medication instructions only on the boxes and not directly on the  medication tubes.   If your medication is too expensive, please contact our office at [phone] option 4 or send [place] a message through [place].   We are unable to tell what your co-pay for medications will be in advance as this is different depending on your insurance coverage. However, we may be able to find a substitute medication at lower cost or fill out paperwork to get insurance to cover a needed medication.   If a prior authorization is required to get your medication covered by your insurance company, please allow [place] 1-2 business days to complete this process.  Drug prices often vary depending on where the prescription is filled and some pharmacies may offer cheaper prices.  The website www.goodrx.com contains coupons for medications through different pharmacies. The prices here do not account for what the cost may be with help from insurance (it may be cheaper with your insurance), but the website can give you the price if you did not use any insurance.  - You can print the associated coupon and take it with your prescription to the pharmacy.  - You may also stop by our office during regular business hours and pick up a GoodRx coupon card.  - If you need your prescription sent electronically to a different pharmacy, notify our office through [facility] or by phone at [phone] option 4.     Si Usted Necesita Algo Despus de Su Visita  Tambin puede enviarnos un mensaje a travs de Pharmacist, community. Por lo general respondemos a los mensajes de MyChart en el transcurso de 1 a 2 das hbiles.  Para renovar recetas, por favor pida a su farmacia que se ponga en contacto con nuestra oficina. Harland Dingwall de fax es [place] [phone].  Si tiene un asunto urgente cuando la clnica est cerrada y que no puede esperar hasta el siguiente da hbil, puede llamar/localizar a su doctor(a) al nmero que aparece a continuacin.   Por favor, tenga en cuenta que aunque hacemos todo lo posible  para estar disponibles para asuntos urgentes fuera del horario de [place], no estamos disponibles las 24 horas del da, los 7 das de la [place].   Si tiene un problema urgente y no puede comunicarse con nosotros, puede optar por buscar atencin mdica  en el consultorio de su doctor(a), en una clnica privada, en un centro de atencin urgente o en una sala de emergencias.  Si tiene Engineering geologist, por favor llame inmediatamente al 911 o vaya a la sala de emergencias.  Nmeros de bper  - Dr. Nehemiah Massed: [phone]  - Dra. Moye: [phone]  - Dra. Nicole Kindred: [phone]  En caso de inclemencias del [place], por favor llame a Johnsie Kindred principal al [phone] para una actualizacin sobre el [place] de cualquier retraso o cierre.  Consejos para la medicacin en dermatologa: Por favor, guarde las cajas en las que vienen los medicamentos de uso tpico para ayudarle a seguir las instrucciones sobre dnde  y cmo usarlos. Las farmacias generalmente imprimen las instrucciones del medicamento slo en las cajas y no directamente en los tubos del [place].   Si su medicamento es muy caro, por favor, pngase en contacto con Zigmund Daniel llamando al [phone] y presione la opcin 4 o envenos un mensaje a travs de Pharmacist, community.   No podemos decirle cul ser su copago por los medicamentos por adelantado ya que esto es diferente dependiendo de la cobertura de su seguro. Sin embargo, es posible que podamos encontrar un medicamento sustituto a Electrical engineer un formulario para que el seguro cubra el medicamento que se considera necesario.   Si se requiere una autorizacin previa para que su compaa de seguros [place] su medicamento, por favor permtanos de 1 a 2 das hbiles para completar este proceso.  Los precios de los medicamentos varan con frecuencia dependiendo del Environmental consultant de dnde se surte la receta y alguna farmacias pueden ofrecer precios ms baratos.  El sitio web  www.goodrx.com tiene cupones para medicamentos de Airline pilot. Los precios aqu no tienen en cuenta lo que podra costar con la ayuda del seguro (puede ser ms barato con su seguro), pero el sitio web puede darle el precio si no utiliz Research scientist (physical sciences).  - Puede imprimir el cupn correspondiente y llevarlo con su receta a la farmacia.  - Tambin puede pasar por nuestra oficina durante el horario de atencin regular y Charity fundraiser una tarjeta de cupones de GoodRx.  - Si necesita que su receta se enve electrnicamente a una farmacia diferente, informe a nuestra oficina a travs de MyChart de [place] o por telfono llamando al [phone] y presione la opcin 4.

## 2022-10-16 ENCOUNTER — Ambulatory Visit: Payer: Medicare Other

## 2022-10-17 ENCOUNTER — Ambulatory Visit: Payer: Medicare Other | Admitting: Dermatology

## 2022-10-23 ENCOUNTER — Ambulatory Visit: Payer: Medicare Other

## 2022-10-25 ENCOUNTER — Telehealth: Payer: Self-pay

## 2022-10-25 NOTE — Telephone Encounter (Signed)
Tried to call patient to review pathology results and treatment options, no answer, no VM. Lurlean Horns., RMA

## 2022-10-30 ENCOUNTER — Ambulatory Visit: Payer: Medicare Other

## 2022-11-06 ENCOUNTER — Ambulatory Visit: Payer: Medicare Other

## 2022-11-08 ENCOUNTER — Telehealth: Payer: Self-pay

## 2022-11-08 NOTE — Telephone Encounter (Signed)
-----   Message from [place], MD sent at 10/25/2022 10:48 AM EST ----- Skin , right nasal supratip BASAL CELL CARCINOMA, SUPERFICIAL AND NODULAR PATTERNS --> Unclear if this is a separate basal cell skin cancer an recurrence at the edge of the area previously treated with ED&C. Based on exam (a small edge of this just abuts the inferior border of the [facility] scar), I favor a separate BCC, but there is no way to be sure.  Mohs surgery is the highest cure rate but it would be a larger surgery and I know they did not feel he was up for this when we discussed before. Other options include ED&C and watching or radiation treatment, which can work well but requires many office visits.   Please give patient and wife my apologies I cannot call personally to discuss since I have laryngitis, but I am happy to schedule/refer if they know what they would like to do, or I can discuss with them next week when my voice is back.  MAs please call. Thank you!

## 2022-11-08 NOTE — Telephone Encounter (Signed)
Patient's wife advised bx results showed BCC, patient prefers EDC. Scheduled 12/19/22. Lurlean Horns., RMA

## 2022-12-17 ENCOUNTER — Other Ambulatory Visit: Payer: Self-pay | Admitting: Pulmonary Disease

## 2022-12-17 DIAGNOSIS — R7989 Other specified abnormal findings of blood chemistry: Secondary | ICD-10-CM

## 2022-12-19 ENCOUNTER — Ambulatory Visit: Payer: Medicare PPO | Admitting: Dermatology

## 2022-12-19 ENCOUNTER — Encounter: Payer: Self-pay | Admitting: Dermatology

## 2022-12-19 VITALS — BP 103/61 | HR 62

## 2022-12-19 DIAGNOSIS — C44311 Basal cell carcinoma of skin of nose: Secondary | ICD-10-CM

## 2022-12-19 DIAGNOSIS — L723 Sebaceous cyst: Secondary | ICD-10-CM | POA: Diagnosis not present

## 2022-12-19 DIAGNOSIS — L578 Other skin changes due to chronic exposure to nonionizing radiation: Secondary | ICD-10-CM | POA: Diagnosis not present

## 2022-12-19 DIAGNOSIS — L720 Epidermal cyst: Secondary | ICD-10-CM

## 2022-12-19 DIAGNOSIS — L570 Actinic keratosis: Secondary | ICD-10-CM | POA: Diagnosis not present

## 2022-12-19 NOTE — Patient Instructions (Addendum)
Wound Care Instructions  Cleanse wound gently with soap and water once a day then pat dry with clean gauze. Apply a thin coat of Petrolatum (petroleum jelly, "Vaseline") over the wound (unless you have an allergy to this). We recommend that you use a new, sterile tube of Vaseline. Do not pick or remove scabs. Do not remove the yellow or white "healing tissue" from the base of the wound.  Cover the wound with fresh, clean, nonstick gauze and secure with paper tape. You may use Band-Aids in place of gauze and tape if the wound is small enough, but would recommend trimming much of the tape off as there is often too much. Sometimes Band-Aids can irritate the skin.  You should call the office for your biopsy report after 1 week if you have not already been contacted.  If you experience any problems, such as abnormal amounts of bleeding, swelling, significant bruising, significant pain, or evidence of infection, please call the office immediately.  FOR ADULT SURGERY PATIENTS: If you need something for pain relief you may take 1 extra strength Tylenol (acetaminophen) AND 2 Ibuprofen ('200mg'$  each) together every 4 hours as needed for pain. (do not take these if you are allergic to them or if you have a reason you should not take them.) Typically, you may only need pain medication for 1 to 3 days.     In case of bleeding, recommend holding firm pressure for 10 minutes without removing pressure. If there is still bleeding, recommend OTC BleedStop Powder available at [employer]. This can be sprinkled onto a clean wound and will help stop bleeding.     Due to recent changes in healthcare laws, you may see results of your pathology and/or laboratory studies on MyChart before the doctors have had a chance to review them. We understand that in some cases there may be results that are confusing or concerning to you. Please understand that not all results are received at the same time and often the doctors may need  to interpret multiple results in order to provide you with the best plan of care or course of treatment. Therefore, we ask that you please give [place] 2 business days to thoroughly review all your results before contacting the office for clarification. Should we see a critical lab result, you will be contacted sooner.   If You Need Anything After Your Visit  If you have any questions or concerns for your doctor, please call our main line at [phone] and press option 4 to reach your doctor's medical assistant. If no one answers, please leave a voicemail as directed and we will return your call as soon as possible. Messages left after 4 pm will be answered the following business day.   You may also send [place] a message via [place]. We typically respond to MyChart messages within 1-2 business days.  For prescription refills, please ask your pharmacy to contact our office. Our fax number is [phone].  If you have an urgent issue when the clinic is closed that cannot wait until the next business day, you can page your doctor at the number below.    Please note that while we do our best to be available for urgent issues outside of office hours, we are not available 24/7.   If you have an urgent issue and are unable to reach [place], you may choose to seek medical care at your doctor's office, retail clinic, urgent care center, or emergency room.  If you have a  medical emergency, please immediately call 911 or go to the emergency department.  Pager Numbers  - Dr. Nehemiah Massed: [phone]  - Dr. Laurence Ferrari: [phone]  - Dr. Nicole Kindred: [phone]  In the event of inclement weather, please call our main line at [phone] for an update on the status of any delays or closures.  Dermatology Medication Tips: Please keep the boxes that topical medications come in in order to help keep track of the instructions about where and how to use these. Pharmacies typically print the medication instructions only on  the boxes and not directly on the medication tubes.   If your medication is too expensive, please contact our office at [phone] option 4 or send [place] a message through [place].   We are unable to tell what your co-pay for medications will be in advance as this is different depending on your insurance coverage. However, we may be able to find a substitute medication at lower cost or fill out paperwork to get insurance to cover a needed medication.   If a prior authorization is required to get your medication covered by your insurance company, please allow [place] 1-2 business days to complete this process.  Drug prices often vary depending on where the prescription is filled and some pharmacies may offer cheaper prices.  The website www.goodrx.com contains coupons for medications through different pharmacies. The prices here do not account for what the cost may be with help from insurance (it may be cheaper with your insurance), but the website can give you the price if you did not use any insurance.  - You can print the associated coupon and take it with your prescription to the pharmacy.  - You may also stop by our office during regular business hours and pick up a GoodRx coupon card.  - If you need your prescription sent electronically to a different pharmacy, notify our office through [facility] or by phone at [phone] option 4.     Si Usted Necesita Algo Despus de Su Visita  Tambin puede enviarnos un mensaje a travs de Pharmacist, community. Por lo general respondemos a los mensajes de MyChart en el transcurso de 1 a 2 das hbiles.  Para renovar recetas, por favor pida a su farmacia que se ponga en contacto con nuestra oficina. Harland Dingwall de fax es [place] [phone].  Si tiene un asunto urgente cuando la clnica est cerrada y que no puede esperar hasta el siguiente da hbil, puede llamar/localizar a su doctor(a) al nmero que aparece a continuacin.   Por favor, tenga en cuenta que  aunque hacemos todo lo posible para estar disponibles para asuntos urgentes fuera del horario de [place], no estamos disponibles las 24 horas del da, los 7 das de la [place].   Si tiene un problema urgente y no puede comunicarse con nosotros, puede optar por buscar atencin mdica  en el consultorio de su doctor(a), en una clnica privada, en un centro de atencin urgente o en una sala de emergencias.  Si tiene Engineering geologist, por favor llame inmediatamente al 911 o vaya a la sala de emergencias.  Nmeros de bper  - Dr. Nehemiah Massed: [phone]  - Dra. Moye: [phone]  - Dra. Nicole Kindred: [phone]  En caso de inclemencias del Media, por favor llame a Johnsie Kindred principal al [phone] para una actualizacin sobre el [place] de cualquier retraso o cierre.  Consejos para la medicacin en dermatologa: Por favor, guarde las cajas en las que vienen los medicamentos de uso tpico para ayudarle  a seguir las [employer] dnde y cmo usarlos. Las farmacias generalmente imprimen las instrucciones del medicamento slo en las cajas y no directamente en los tubos del [place].   Si su medicamento es muy caro, por favor, pngase en contacto con Zigmund Daniel llamando al [phone] y presione la opcin 4 o envenos un mensaje a travs de Pharmacist, community.   No podemos decirle cul ser su copago por los medicamentos por adelantado ya que esto es diferente dependiendo de la cobertura de su seguro. Sin embargo, es posible que podamos encontrar un medicamento sustituto a Electrical engineer un formulario para que el seguro cubra el medicamento que se considera necesario.   Si se requiere una autorizacin previa para que su compaa de seguros [place] su medicamento, por favor permtanos de 1 a 2 das hbiles para completar este proceso.  Los precios de los medicamentos varan con frecuencia dependiendo del Environmental consultant de dnde se surte la receta y alguna farmacias pueden ofrecer precios ms  baratos.  El sitio web www.goodrx.com tiene cupones para medicamentos de Airline pilot. Los precios aqu no tienen en cuenta lo que podra costar con la ayuda del seguro (puede ser ms barato con su seguro), pero el sitio web puede darle el precio si no utiliz Research scientist (physical sciences).  - Puede imprimir el cupn correspondiente y llevarlo con su receta a la farmacia.  - Tambin puede pasar por nuestra oficina durante el horario de atencin regular y Charity fundraiser una tarjeta de cupones de GoodRx.  - Si necesita que su receta se enve electrnicamente a una farmacia diferente, informe a nuestra oficina a travs de MyChart de [place] o por telfono llamando al [phone] y presione la opcin 4.

## 2022-12-19 NOTE — Progress Notes (Signed)
Follow-Up Visit   Subjective  Dylan Weber is a 89 y.o. male who presents for the following: Skin Cancer (Here to treat BCC at right nasal supratip. Bx: 10/11/2022) and Skin Problem (Patient states he has more areas of concern on face today).  The patient has spots, moles and lesions to be evaluated, some may be new or changing and the patient has concerns that these could be cancer.  The following portions of the chart were reviewed this encounter and updated as appropriate:  Tobacco  Allergies  Meds  Problems  Med Hx  Surg Hx  Fam Hx      Review of Systems: No other skin or systemic complaints except as noted in HPI or Assessment and Plan.   Objective  Well appearing patient in no apparent distress; mood and affect are within normal limits.  A focused examination was performed including face. Relevant physical exam findings are noted in the Assessment and Plan.  Right nasal supratip Pink healing biopsy site  Right Temple Subcutaneous erythematous papule/nodule     Right Temple x1 Erythematous thin papules/macules with gritty scale.    Assessment & Plan  Basal cell carcinoma (BCC) of skin of nose Right nasal supratip  Destruction of lesion  Destruction method: electrodesiccation and curettage   Informed consent: discussed and consent obtained   Timeout:  patient name, date of birth, surgical site, and procedure verified Anesthesia: the lesion was anesthetized in a standard fashion   Anesthetic:  1% lidocaine w/ epinephrine 1-100,000 buffered w/ 8.4% NaHCO3 Curettage performed in three different directions: Yes   Electrodesiccation performed over the curetted area: Yes   Curettage cycles:  3 Final wound size (cm):  2.3 Hemostasis achieved with:  electrodesiccation Outcome: patient tolerated procedure well with no complications   Post-procedure details: sterile dressing applied and wound care instructions given   Dressing type: petrolatum    Did discuss  options of Mohs vs radiation vs ED&C.  ED&C has about a 70% cure rate in this area and leaves a round wound the size of the skin cancer which is healed with ointment and a bandage over a few weeks time. It leaves a round white scar. No additional pathology is done.   Mohs involves cutting out right around the spot and then checking under the microscope to be sure the whole skin cancer is out. The cure rate is about 98-99%. It is done at another office outside of [place] ([place], [place], or [place]). Once the Mohs surgeon confirms the skin cancer is out, they will discuss the options to repair or heal the area. You must take it easy for about two weeks after surgery (no lifting over 10-15 lbs, avoid activity to get your heart rate and blood pressure up).   Radiation requires multiple visits and has a cure rate in the 90%+ range.  Patient and wife do not feel he is up to Mohs or radiation at this time and prefer ED&C. They voice understanding that this skin cancer is more likely to come back with [facility] and may grow deeper and larger if it does.  Further discussed that this may be a new BCC or could be a recurrent BCC as it was right near the edge of his previous ED&C site. Discussed if this is already recurrent, it is much higher risk it may come back again. They voice understanding and wish to proceed with [facility] today.  Inflamed epidermoid cyst of skin Right Temple  Cyst contents expressed. Advised this  is c/w cyst today but they should call for any worsening or changes. If doing well, can return PRN.   Benign-appearing.  Observation.  Call clinic for new or changing lesions.    AK (actinic keratosis) Right Temple x1  Actinic keratoses are precancerous spots that appear secondary to cumulative UV radiation exposure/sun exposure over time. They are chronic with expected duration over 1 year. A portion of actinic keratoses will progress to squamous cell carcinoma of the skin. It is not  possible to reliably predict which spots will progress to skin cancer and so treatment is recommended to prevent development of skin cancer.  Recommend daily broad spectrum sunscreen SPF 30+ to sun-exposed areas, reapply every 2 hours as needed.  Recommend staying in the shade or wearing long sleeves, sun glasses (UVA+UVB protection) and wide brim hats (4-inch brim around the entire circumference of the hat). Call for new or changing lesions.  Prior to procedure, discussed risks of blister formation, small wound, skin dyspigmentation, or rare scar following cryotherapy. Recommend Vaseline ointment to treated areas while healing.   Destruction of lesion - Right Temple x1  Destruction method: cryotherapy   Informed consent: discussed and consent obtained   Lesion destroyed using liquid nitrogen: Yes   Region frozen until ice ball extended beyond lesion: Yes   Outcome: patient tolerated procedure well with no complications   Post-procedure details: wound care instructions given   Additional details:  Prior to procedure, discussed risks of blister formation, small wound, skin dyspigmentation, or rare scar following cryotherapy. Recommend Vaseline ointment to treated areas while healing.    Milia. Face. - tiny firm white papules - type of cyst - benign - may be extracted if symptomatic - observe  Actinic Damage. Face.    - chronic, secondary to cumulative UV radiation exposure/sun exposure over time - diffuse scaly erythematous macules with underlying dyspigmentation - Recommend daily broad spectrum sunscreen SPF 30+ to sun-exposed areas, reapply every 2 hours as needed.  - Recommend staying in the shade or wearing long sleeves, sun glasses (UVA+UVB protection) and wide brim hats (4-inch brim around the entire circumference of the hat). - Call for new or changing lesions.   Return for Biopsy Follow Up in 1 1/2 - 2 months.  I, Emelia Salisbury, CMA, am acting as scribe for [place] Gleason,  MD.  Documentation: I have reviewed the above documentation for accuracy and completeness, and I agree with the above.  [place] Gleason, MD

## 2022-12-28 ENCOUNTER — Ambulatory Visit
Admission: RE | Admit: 2022-12-28 | Discharge: 2022-12-28 | Disposition: A | Discharge status: Home / Self Care | Payer: Medicare PPO | Source: Ambulatory Visit | Attending: Pulmonary Disease | Admitting: Pulmonary Disease

## 2022-12-28 DIAGNOSIS — R7989 Other specified abnormal findings of blood chemistry: Secondary | ICD-10-CM | POA: Insufficient documentation

## 2022-12-28 MED ORDER — IOHEXOL 350 MG/ML SOLN
75.0000 mL | Freq: Once | INTRAVENOUS | Status: AC | PRN
  Administered 2022-12-28: 75 mL via INTRAVENOUS

## 2023-01-01 ENCOUNTER — Encounter: Payer: Self-pay | Admitting: Dermatology

## 2023-02-14 ENCOUNTER — Ambulatory Visit: Payer: Medicare PPO | Admitting: Dermatology

## 2023-02-14 ENCOUNTER — Encounter: Payer: Self-pay | Admitting: Dermatology

## 2023-02-14 VITALS — BP 102/63 | HR 63

## 2023-02-14 DIAGNOSIS — D492 Neoplasm of unspecified behavior of bone, soft tissue, and skin: Secondary | ICD-10-CM

## 2023-02-14 DIAGNOSIS — L814 Other melanin hyperpigmentation: Secondary | ICD-10-CM | POA: Diagnosis not present

## 2023-02-14 DIAGNOSIS — L821 Other seborrheic keratosis: Secondary | ICD-10-CM

## 2023-02-14 DIAGNOSIS — L720 Epidermal cyst: Secondary | ICD-10-CM | POA: Diagnosis not present

## 2023-02-14 DIAGNOSIS — L570 Actinic keratosis: Secondary | ICD-10-CM

## 2023-02-14 DIAGNOSIS — Z85828 Personal history of other malignant neoplasm of skin: Secondary | ICD-10-CM

## 2023-02-14 DIAGNOSIS — L578 Other skin changes due to chronic exposure to nonionizing radiation: Secondary | ICD-10-CM

## 2023-02-14 NOTE — Progress Notes (Addendum)
Follow-Up Visit   Subjective  Dylan Weber is a 89 y.o. male who presents for the following: BCC recheck, [facility] Has other areas of concern on face.  Wife with patient.   The following portions of the chart were reviewed this encounter and updated as appropriate: medications, allergies, medical history  Review of Systems:  No other skin or systemic complaints except as noted in HPI or Assessment and Plan.  Objective  Well appearing patient in no apparent distress; mood and affect are within normal limits.  A focused examination was performed of the following areas: face  Relevant exam findings are noted in the Assessment and Plan.  Right Temple 0.8 cm pink papule    Assessment & Plan    HISTORY OF BASAL CELL CARCINOMA OF THE SKIN. Right nasal supratip. EDC 12/19/2022. - No evidence of recurrence today - Recommend regular full body skin exams - Recommend daily broad spectrum sunscreen SPF 30+ to sun-exposed areas, reapply every 2 hours as needed.  - Call if any new or changing lesions are noted between office visits   SEBORRHEIC KERATOSIS - Stuck-on, waxy, tan-brown papules and/or plaques  - Benign-appearing - Discussed benign etiology and prognosis. - Observe - Call for any changes  LENTIGINES Exam: scattered tan macules Due to sun exposure Treatment Plan: Benign-appearing, observe. Recommend daily broad spectrum sunscreen SPF 30+ to sun-exposed areas, reapply every 2 hours as needed.  Call for any changes  ACTINIC KERATOSIS Exam: Erythematous thin papules/macules with gritty scale  Actinic keratoses are precancerous spots that appear secondary to cumulative UV radiation exposure/sun exposure over time. They are chronic with expected duration over 1 year. A portion of actinic keratoses will progress to squamous cell carcinoma of the skin. It is not possible to reliably predict which spots will progress to skin cancer and so treatment is  recommended to prevent development of skin cancer.  Recommend daily broad spectrum sunscreen SPF 30+ to sun-exposed areas, reapply every 2 hours as needed.  Recommend staying in the shade or wearing long sleeves, sun glasses (UVA+UVB protection) and wide brim hats (4-inch brim around the entire circumference of the hat). Call for new or changing lesions.  Treatment Plan:  Prior to procedure, discussed risks of blister formation, small wound, skin dyspigmentation, or rare scar following cryotherapy. Recommend Vaseline ointment to treated areas while healing.  Destruction Procedure Note Destruction method: cryotherapy   Informed consent: discussed and consent obtained   Lesion destroyed using liquid nitrogen: Yes   Outcome: patient tolerated procedure well with no complications   Post-procedure details: wound care instructions given   Locations: scalp x2 # of Lesions Treated: 2     Neoplasm of skin Right Temple  Favor inflamed epidermal inclusion cyst > SCC. Have extruded chalky white cyst contents previously.  Will plan excision to get pathology as it is symptomatic either way.    ACTINIC DAMAGE - chronic, secondary to cumulative UV radiation exposure/sun exposure over time - diffuse scaly erythematous macules with underlying dyspigmentation - Recommend daily broad spectrum sunscreen SPF 30+ to sun-exposed areas, reapply every 2 hours as needed.  - Recommend staying in the shade or wearing long sleeves, sun glasses (UVA+UVB protection) and wide brim hats (4-inch brim around the entire circumference of the hat). - Call for new or changing lesions.    Inflamed MILIA Exam: tiny erythematous firm white papules right medial cheek and left temple x2  Discussed this is a type of cyst. Benign-appearing. Sometimes these will  clear with OTC adapalene/Differin 0.1% cream QHS.  Symptomatic, irritating, patient would like treated. Advised may not be covered by insurance.  Procedure  risks and benefits were discussed with the patient and verbal consent was obtained. Following prep of the skin on the right oral commissure with an alcohol swab, area was injected with 1% lidocaine/epinephrine, extraction of milia was performed with cotton tip applicators following superficial incision made over their surfaces with a #11 surgical blade. Capillary hemostasis was achieved with 20% aluminum chloride solution. Vaseline ointment was applied to each site. The patient tolerated the procedure well.    Return for Cyst Excision, Next Available.  I, Lawson RadarJill Parcell, CMA, am acting as scribe for Darden DatesVIRGINA Bonnetta Allbee, MD.   Documentation: I have reviewed the above documentation for accuracy and completeness, and I agree with the above.  Darden DatesVIRGINA Merilynn Haydu, MD

## 2023-02-14 NOTE — Patient Instructions (Signed)
Cryotherapy Aftercare  Wash gently with soap and water everyday.   Apply Vaseline daily until healed.   Recommend daily broad spectrum sunscreen SPF 30+ to sun-exposed areas, reapply every 2 hours as needed. Call for new or changing lesions.  Staying in the shade or wearing long sleeves, sun glasses (UVA+UVB protection) and wide brim hats (4-inch brim around the entire circumference of the hat) are also recommended for sun protection.     Due to recent changes in healthcare laws, you may see results of your pathology and/or laboratory studies on MyChart before the doctors have had a chance to review them. We understand that in some cases there may be results that are confusing or concerning to you. Please understand that not all results are received at the same time and often the doctors may need to interpret multiple results in order to provide you with the best plan of care or course of treatment. Therefore, we ask that you please give us 2 business days to thoroughly review all your results before contacting the office for clarification. Should we see a critical lab result, you will be contacted sooner.   If You Need Anything After Your Visit  If you have any questions or concerns for your doctor, please call our main line at [phone] and press option 4 to reach your doctor's medical assistant. If no one answers, please leave a voicemail as directed and we will return your call as soon as possible. Messages left after 4 pm will be answered the following business day.   You may also send us a message via MyChart. We typically respond to MyChart messages within 1-2 business days.  For prescription refills, please ask your pharmacy to contact our office. Our fax number is [phone].  If you have an urgent issue when the clinic is closed that cannot wait until the next business day, you can page your doctor at the number below.    Please note that while we do our best to be available for  urgent issues outside of office hours, we are not available 24/7.   If you have an urgent issue and are unable to reach us, you may choose to seek medical care at your doctor's office, retail clinic, urgent care center, or emergency room.  If you have a medical emergency, please immediately call 911 or go to the emergency department.  Pager Numbers  - Dr. Kowalski: [phone]  - Dr. Moye: [phone]  - Dr. Stewart: [phone]  In the event of inclement weather, please call our main line at [phone] for an update on the status of any delays or closures.  Dermatology Medication Tips: Please keep the boxes that topical medications come in in order to help keep track of the instructions about where and how to use these. Pharmacies typically print the medication instructions only on the boxes and not directly on the medication tubes.   If your medication is too expensive, please contact our office at [phone] option 4 or send us a message through MyChart.   We are unable to tell what your co-pay for medications will be in advance as this is different depending on your insurance coverage. However, we may be able to find a substitute medication at lower cost or fill out paperwork to get insurance to cover a needed medication.   If a prior authorization is required to get your medication covered by your insurance company, please allow us 1-2 business days to complete this process.  Drug prices   often vary depending on where the prescription is filled and some pharmacies may offer cheaper prices.  The website www.goodrx.com contains coupons for medications through different pharmacies. The prices here do not account for what the cost may be with help from insurance (it may be cheaper with your insurance), but the website can give you the price if you did not use any insurance.  - You can print the associated coupon and take it with your prescription to the pharmacy.  - You may also  stop by our office during regular business hours and pick up a GoodRx coupon card.  - If you need your prescription sent electronically to a different pharmacy, notify our office through [facility] MyChart or by phone at [phone] option 4.     Si Usted Necesita Algo Despus de Su Visita  Tambin puede enviarnos un mensaje a travs de MyChart. Por lo general respondemos a los mensajes de MyChart en el transcurso de 1 a 2 das hbiles.  Para renovar recetas, por favor pida a su farmacia que se ponga en contacto con nuestra oficina. Nuestro nmero de fax es el [phone].  Si tiene un asunto urgente cuando la clnica est cerrada y que no puede esperar hasta el siguiente da hbil, puede llamar/localizar a su doctor(a) al nmero que aparece a continuacin.   Por favor, tenga en cuenta que aunque hacemos todo lo posible para estar disponibles para asuntos urgentes fuera del horario de oficina, no estamos disponibles las 24 horas del da, los 7 das de la semana.   Si tiene un problema urgente y no puede comunicarse con nosotros, puede optar por buscar atencin mdica  en el consultorio de su doctor(a), en una clnica privada, en un centro de atencin urgente o en una sala de emergencias.  Si tiene una emergencia mdica, por favor llame inmediatamente al 911 o vaya a la sala de emergencias.  Nmeros de bper  - Dr. Kowalski: [phone]  - Dra. Moye: [phone]  - Dra. Stewart: [phone]  En caso de inclemencias del tiempo, por favor llame a nuestra lnea principal al [phone] para una actualizacin sobre el estado de cualquier retraso o cierre.  Consejos para la medicacin en dermatologa: Por favor, guarde las cajas en las que vienen los medicamentos de uso tpico para ayudarle a seguir las instrucciones sobre dnde y cmo usarlos. Las farmacias generalmente imprimen las instrucciones del medicamento slo en las cajas y no directamente en los tubos del medicamento.   Si  su medicamento es muy caro, por favor, pngase en contacto con nuestra oficina llamando al [phone] y presione la opcin 4 o envenos un mensaje a travs de MyChart.   No podemos decirle cul ser su copago por los medicamentos por adelantado ya que esto es diferente dependiendo de la cobertura de su seguro. Sin embargo, es posible que podamos encontrar un medicamento sustituto a menor costo o llenar un formulario para que el seguro cubra el medicamento que se considera necesario.   Si se requiere una autorizacin previa para que su compaa de seguros cubra su medicamento, por favor permtanos de 1 a 2 das hbiles para completar este proceso.  Los precios de los medicamentos varan con frecuencia dependiendo del lugar de dnde se surte la receta y alguna farmacias pueden ofrecer precios ms baratos.  El sitio web www.goodrx.com tiene cupones para medicamentos de diferentes farmacias. Los precios aqu no tienen en cuenta lo que podra costar con la ayuda del seguro (puede ser ms barato con   su seguro), pero el sitio web puede darle el precio si no utiliz ningn seguro.  - Puede imprimir el cupn correspondiente y llevarlo con su receta a la farmacia.  - Tambin puede pasar por nuestra oficina durante el horario de atencin regular y recoger una tarjeta de cupones de GoodRx.  - Si necesita que su receta se enve electrnicamente a una farmacia diferente, informe a nuestra oficina a travs de MyChart de [place] o por telfono llamando al [phone] y presione la opcin 4.  

## 2023-02-20 ENCOUNTER — Ambulatory Visit (INDEPENDENT_AMBULATORY_CARE_PROVIDER_SITE_OTHER): Payer: Medicare PPO | Admitting: Dermatology

## 2023-02-20 DIAGNOSIS — C4492 Squamous cell carcinoma of skin, unspecified: Secondary | ICD-10-CM

## 2023-02-20 DIAGNOSIS — C44329 Squamous cell carcinoma of skin of other parts of face: Secondary | ICD-10-CM | POA: Diagnosis not present

## 2023-02-20 DIAGNOSIS — D485 Neoplasm of uncertain behavior of skin: Secondary | ICD-10-CM

## 2023-02-20 HISTORY — DX: Squamous cell carcinoma of skin, unspecified: C44.92

## 2023-02-20 MED ORDER — MUPIROCIN 2 % EX OINT: 1.0000 | TOPICAL_OINTMENT | Freq: Every day | CUTANEOUS | 0 refills | Status: DC

## 2023-02-20 NOTE — Progress Notes (Unsigned)
   Follow-Up Visit   Subjective  Dylan Weber is a 89 y.o. male who presents for the following: Procedure (Excision at right temple).  The following portions of the chart were reviewed this encounter and updated as appropriate:   Tobacco  Allergies  Meds  Problems  Med Hx  Surg Hx  Fam Hx      Review of Systems:  No other skin or systemic complaints except as noted in HPI or Assessment and Plan.  Objective  Well appearing patient in no apparent distress; mood and affect are within normal limits.  A focused examination was performed including face. Relevant physical exam findings are noted in the Assessment and Plan.  Right Temple 0.8 cm pink papule     Assessment & Plan  Neoplasm of uncertain behavior of skin Right Temple  Skin repair Complexity:  Intermediate Final length (cm):  1 Informed consent: discussed and consent obtained   Timeout: patient name, date of birth, surgical site, and procedure verified   Procedure prep:  Patient was prepped and draped in usual sterile fashion Prep type:  Chlorhexidine Anesthesia: the lesion was anesthetized in a standard fashion   Anesthetic:  1% lidocaine w/ epinephrine 1-100,000 local infiltration Reason for type of repair: reduce tension to allow closure, reduce the risk of dehiscence, infection, and necrosis, preserve normal anatomy and preserve normal anatomical and functional relationships   Undermining: edges undermined   Subcutaneous layers (deep stitches):  Suture size:  4-0 Suture type: Vicryl (polyglactin 910)   Fine/surface layer approximation (top stitches):  Suture size:  5-0 Suture type: nylon   Suture removal (days):  7 Hemostasis achieved with: suture, pressure and electrodesiccation Outcome: patient tolerated procedure well with no complications   Post-procedure details: wound care instructions given   Additional details:  Mupirocin and a pressure dressing applied  Skin / nail biopsy Type of biopsy:  punch   Informed consent: discussed and consent obtained   Timeout: patient name, date of birth, surgical site, and procedure verified   Procedure prep:  Patient was prepped and draped in usual sterile fashion Prep type:  Isopropyl alcohol Anesthesia: the lesion was anesthetized in a standard fashion   Anesthetic:  1% lidocaine w/ epinephrine 1-100,000 buffered w/ 8.4% NaHCO3 Punch size:  6 mm Hemostasis achieved with: suture   Outcome: patient tolerated procedure well   Post-procedure details: wound care instructions given   Additional details:  Petrolatum and a pressure dressing were applied  Specimen 1 - Surgical pathology Differential Diagnosis: r/o Cyst vs SCC vs Other  Check Margins: No 1 cm pink papule 3 specimens, 2 very small  R/o inflamed cyst vs SCC. Exam more suspicious for SCC today than at previous visits. For this reason, will perform a large punch biopsy rather than an elliptical to minimize tissue disruption in case of additional surgery needed. Discussed if this is a skin cancer rather than a cyst, it will need another treatment.    Return in about 1 week (around 02/27/2023) for Suture Removal.  I, Addison Bailey, RMA, scribed for Sandi Mealy, MD.  Documentation: I have reviewed the above documentation for accuracy and completeness, and I agree with the above.  Darden Dates, MD

## 2023-02-20 NOTE — Patient Instructions (Signed)
Wound Care Instructions for After Surgery  On the day following your surgery, you should begin doing daily dressing changes until your sutures are removed: Remove the bandage. Cleanse the wound gently with soap and water.  Make sure you then dry the skin surrounding the wound completely or the tape will not stick to the skin. Do not use cotton balls on the wound. After the wound is clean and dry, apply the ointment (either prescription antibiotic prescribed by your doctor or plain Vaseline if nothing was prescribed) gently with a Q-tip. If you are using a bandaid to cover: Apply a bandaid large enough to cover the entire wound. If you do not have a bandaid large enough to cover the wound OR if you are sensitive to bandaid adhesive: Cut a non-stick pad (such as Telfa) to fit the size of the wound.  Cover the wound with the non-stick pad. If the wound is draining, you may want to add a small amount of gauze on top of the non-stick pad for a little added compression to the area. Use tape to seal the area completely.  For the next 1-2 weeks: Be sure to keep the wound moist with ointment 24/7 to ensure best healing. If you are unable to cover the wound with a bandage to hold the ointment in place, you may need to reapply the ointment several times a day. Do not bend over or lift heavy items to reduce the chance of elevated blood pressure to the wound. Do not participate in particularly strenuous activities.  Below is a list of dressing supplies you might need.  Cotton-tipped applicators - Q-tips Gauze pads (2x2 and/or 4x4) - All-Purpose Sponges New and clean tube of petroleum jelly (Vaseline) OR prescription antibiotic ointment if prescribed Either a bandaid large enough to cover the entire wound OR non-stick dressing material (Telfa) and Tape (Paper or Hypafix)  FOR ADULT SURGERY PATIENTS: If you need something for pain relief, you may take 1 extra strength Tylenol (acetaminophen) and 2  ibuprofen (200 mg) together every 4 hours as needed. (Do not take these medications if you are allergic to them or if you know you cannot take them for any other reason). Typically you may only need pain medication for 1-3 days.   Comments on the Post-Operative Period Slight swelling and redness often appear around the wound. This is normal and will disappear within several days following the surgery. The healing wound will drain a brownish-red-yellow discharge during healing. This is a normal phase of wound healing. As the wound begins to heal, the drainage may increase in amount. Again, this drainage is normal. Notify us if the drainage becomes persistently bloody, excessively swollen, or intensely painful or develops a foul odor or red streaks.  The healing wound will also typically be itchy. This is normal. If you have severe or persistent pain, Notify us if the discomfort is severe or persistent. Avoid alcoholic beverages when taking pain medicine.  In Case of Wound Hemorrhage A wound hemorrhage is when the bandage suddenly becomes soaked with bright red blood and flows profusely. If this happens, sit down or lie down with your head elevated. If the wound has a dressing on it, do not remove the dressing. Apply pressure to the existing gauze. If the wound is not covered, use a gauze pad to apply pressure and continue applying the pressure for 20 minutes without peeking. DO NOT COVER THE WOUND WITH A LARGE TOWEL OR WASH CLOTH. Release your hand from the   wound site but do not remove the dressing. If the bleeding has stopped, gently clean around the wound. Leave the dressing in place for 24 hours if possible. This wait time allows the blood vessels to close off so that you do not spark a new round of bleeding by disrupting the newly clotted blood vessels with an immediate dressing change. If the bleeding does not subside, continue to hold pressure for 40 minutes. If bleeding continues, page your  physician, contact an After Hours clinic or go to the Emergency Room.   Due to recent changes in healthcare laws, you may see results of your pathology and/or laboratory studies on MyChart before the doctors have had a chance to review them. We understand that in some cases there may be results that are confusing or concerning to you. Please understand that not all results are received at the same time and often the doctors may need to interpret multiple results in order to provide you with the best plan of care or course of treatment. Therefore, we ask that you please give us 2 business days to thoroughly review all your results before contacting the office for clarification. Should we see a critical lab result, you will be contacted sooner.   If You Need Anything After Your Visit  If you have any questions or concerns for your doctor, please call our main line at [phone] and press option 4 to reach your doctor's medical assistant. If no one answers, please leave a voicemail as directed and we will return your call as soon as possible. Messages left after 4 pm will be answered the following business day.   You may also send us a message via MyChart. We typically respond to MyChart messages within 1-2 business days.  For prescription refills, please ask your pharmacy to contact our office. Our fax number is [phone].  If you have an urgent issue when the clinic is closed that cannot wait until the next business day, you can page your doctor at the number below.    Please note that while we do our best to be available for urgent issues outside of office hours, we are not available 24/7.   If you have an urgent issue and are unable to reach us, you may choose to seek medical care at your doctor's office, retail clinic, urgent care center, or emergency room.  If you have a medical emergency, please immediately call 911 or go to the emergency department.  Pager Numbers  - Dr. Kowalski:  [phone]  - Dr. Moye: [phone]  - Dr. Stewart: [phone]  In the event of inclement weather, please call our main line at [phone] for an update on the status of any delays or closures.  Dermatology Medication Tips: Please keep the boxes that topical medications come in in order to help keep track of the instructions about where and how to use these. Pharmacies typically print the medication instructions only on the boxes and not directly on the medication tubes.   If your medication is too expensive, please contact our office at [phone] option 4 or send us a message through MyChart.   We are unable to tell what your co-pay for medications will be in advance as this is different depending on your insurance coverage. However, we may be able to find a substitute medication at lower cost or fill out paperwork to get insurance to cover a needed medication.   If a prior authorization is required to get your medication covered   by your insurance company, please allow us 1-2 business days to complete this process.  Drug prices often vary depending on where the prescription is filled and some pharmacies may offer cheaper prices.  The website www.goodrx.com contains coupons for medications through different pharmacies. The prices here do not account for what the cost may be with help from insurance (it may be cheaper with your insurance), but the website can give you the price if you did not use any insurance.  - You can print the associated coupon and take it with your prescription to the pharmacy.  - You may also stop by our office during regular business hours and pick up a GoodRx coupon card.  - If you need your prescription sent electronically to a different pharmacy, notify our office through  MyChart or by phone at [phone] option 4.     Si Usted Necesita Algo Despus de Su Visita  Tambin puede enviarnos un mensaje a travs de MyChart. Por lo general  respondemos a los mensajes de MyChart en el transcurso de 1 a 2 das hbiles.  Para renovar recetas, por favor pida a su farmacia que se ponga en contacto con nuestra oficina. Nuestro nmero de fax es el [phone].  Si tiene un asunto urgente cuando la clnica est cerrada y que no puede esperar hasta el siguiente da hbil, puede llamar/localizar a su doctor(a) al nmero que aparece a continuacin.   Por favor, tenga en cuenta que aunque hacemos todo lo posible para estar disponibles para asuntos urgentes fuera del horario de oficina, no estamos disponibles las 24 horas del da, los 7 das de la semana.   Si tiene un problema urgente y no puede comunicarse con nosotros, puede optar por buscar atencin mdica  en el consultorio de su doctor(a), en una clnica privada, en un centro de atencin urgente o en una sala de emergencias.  Si tiene una emergencia mdica, por favor llame inmediatamente al 911 o vaya a la sala de emergencias.  Nmeros de bper  - Dr. Kowalski: [phone]  - Dra. Moye: [phone]  - Dra. Stewart: [phone]  En caso de inclemencias del tiempo, por favor llame a nuestra lnea principal al [phone] para una actualizacin sobre el estado de cualquier retraso o cierre.  Consejos para la medicacin en dermatologa: Por favor, guarde las cajas en las que vienen los medicamentos de uso tpico para ayudarle a seguir las instrucciones sobre dnde y cmo usarlos. Las farmacias generalmente imprimen las instrucciones del medicamento slo en las cajas y no directamente en los tubos del medicamento.   Si su medicamento es muy caro, por favor, pngase en contacto con nuestra oficina llamando al [phone] y presione la opcin 4 o envenos un mensaje a travs de MyChart.   No podemos decirle cul ser su copago por los medicamentos por adelantado ya que esto es diferente dependiendo de la cobertura de su seguro. Sin embargo, es posible que podamos encontrar un  medicamento sustituto a menor costo o llenar un formulario para que el seguro cubra el medicamento que se considera necesario.   Si se requiere una autorizacin previa para que su compaa de seguros cubra su medicamento, por favor permtanos de 1 a 2 das hbiles para completar este proceso.  Los precios de los medicamentos varan con frecuencia dependiendo del lugar de dnde se surte la receta y alguna farmacias pueden ofrecer precios ms baratos.  El sitio web www.goodrx.com tiene cupones para medicamentos de diferentes farmacias. Los precios aqu no   tienen en cuenta lo que podra costar con la ayuda del seguro (puede ser ms barato con su seguro), pero el sitio web puede darle el precio si no utiliz ningn seguro.  - Puede imprimir el cupn correspondiente y llevarlo con su receta a la farmacia.  - Tambin puede pasar por nuestra oficina durante el horario de atencin regular y recoger una tarjeta de cupones de GoodRx.  - Si necesita que su receta se enve electrnicamente a una farmacia diferente, informe a nuestra oficina a travs de MyChart de [place] o por telfono llamando al [phone] y presione la opcin 4.   

## 2023-02-27 ENCOUNTER — Encounter: Payer: Self-pay | Admitting: Dermatology

## 2023-02-28 ENCOUNTER — Encounter: Payer: Self-pay | Admitting: Dermatology

## 2023-02-28 ENCOUNTER — Other Ambulatory Visit: Payer: Self-pay

## 2023-02-28 ENCOUNTER — Ambulatory Visit (INDEPENDENT_AMBULATORY_CARE_PROVIDER_SITE_OTHER): Payer: Medicare PPO | Admitting: Dermatology

## 2023-02-28 VITALS — BP 102/63

## 2023-02-28 DIAGNOSIS — Z4802 Encounter for removal of sutures: Secondary | ICD-10-CM

## 2023-02-28 DIAGNOSIS — C4492 Squamous cell carcinoma of skin, unspecified: Secondary | ICD-10-CM

## 2023-02-28 NOTE — Progress Notes (Signed)
   Follow-Up Visit   Subjective  Dylan Weber is a 89 y.o. male who presents for the following: Suture removal  Pathology showed results not back yet. Patient advised we will call with results.   The following portions of the chart were reviewed this encounter and updated as appropriate: medications, allergies, medical history  Review of Systems:  No other skin or systemic complaints except as noted in HPI or Assessment and Plan.  Objective  Well appearing patient in no apparent distress; mood and affect are within normal limits.  Areas Examined: Right temple Relevant physical exam findings are noted in the Assessment and Plan.    Assessment & Plan    Encounter for Removal of Sutures - Incision site is clean, dry and intact. - Wound cleansed, sutures removed, wound cleansed and steri strips applied.  - Discussed pathology results showing Patient advised we will call with results.  - Patient advised to keep steri-strips dry until they fall off. - Scars remodel for a full year. - Once steri-strips fall off, patient can apply over-the-counter silicone scar cream once to twice a day to help with scar remodeling if desired. - Patient advised to call with any concerns or if they notice any new or changing lesions.  Return for as scheduled.  Anise Salvo, RMA, am acting as scribe for Darden Dates, MD .   Documentation: I have reviewed the above documentation for accuracy and completeness, and I agree with the above.  Darden Dates, MD

## 2023-03-19 ENCOUNTER — Telehealth: Payer: Self-pay

## 2023-03-19 NOTE — Telephone Encounter (Signed)
Encounter notes from [facility] scanned in under the media tab for your review  

## 2023-03-20 NOTE — Telephone Encounter (Signed)
Reviewed. MAs please confirm that patient is scheduled for FBSE and mark off in the biopsy book. Thank you! 

## 2023-03-26 ENCOUNTER — Telehealth: Payer: Self-pay

## 2023-03-26 NOTE — Telephone Encounter (Signed)
Updated specimen tracking and history from [facility] progress notes. aw 

## 2023-03-27 NOTE — Telephone Encounter (Signed)
Patient scheduled for FBSE 04/11/23 - specimen tracking completed, JS

## 2023-04-11 ENCOUNTER — Ambulatory Visit: Payer: Medicare PPO | Admitting: Dermatology

## 2023-04-11 DIAGNOSIS — X32XXXA Exposure to sunlight, initial encounter: Secondary | ICD-10-CM

## 2023-04-11 DIAGNOSIS — L820 Inflamed seborrheic keratosis: Secondary | ICD-10-CM

## 2023-04-11 DIAGNOSIS — L578 Other skin changes due to chronic exposure to nonionizing radiation: Secondary | ICD-10-CM

## 2023-04-11 DIAGNOSIS — W908XXA Exposure to other nonionizing radiation, initial encounter: Secondary | ICD-10-CM | POA: Diagnosis not present

## 2023-04-11 DIAGNOSIS — L821 Other seborrheic keratosis: Secondary | ICD-10-CM

## 2023-04-11 DIAGNOSIS — Z1283 Encounter for screening for malignant neoplasm of skin: Secondary | ICD-10-CM

## 2023-04-11 DIAGNOSIS — L570 Actinic keratosis: Secondary | ICD-10-CM | POA: Diagnosis not present

## 2023-04-11 DIAGNOSIS — L814 Other melanin hyperpigmentation: Secondary | ICD-10-CM

## 2023-04-11 DIAGNOSIS — Z85828 Personal history of other malignant neoplasm of skin: Secondary | ICD-10-CM

## 2023-04-11 DIAGNOSIS — D1801 Hemangioma of skin and subcutaneous tissue: Secondary | ICD-10-CM

## 2023-04-11 NOTE — Progress Notes (Signed)
Follow-Up Visit   Subjective  Dylan Weber is a 89 y.o. male who presents for the following: Skin Cancer Screening and Full Body Skin Exam  The patient presents for Total-Body Skin Exam (TBSE) for skin cancer screening and mole check. The patient has spots, moles and lesions to be evaluated, some may be new or changing and the patient has concerns that these could be cancer.  Hx SCC, BCC. Patient with a spot at left temple.  The following portions of the chart were reviewed this encounter and updated as appropriate: medications, allergies, medical history  Review of Systems:  No other skin or systemic complaints except as noted in HPI or Assessment and Plan.  Objective  Well appearing patient in no apparent distress; mood and affect are within normal limits.  A full examination was performed including scalp, head, eyes, ears, nose, lips, neck, chest, axillae, abdomen, back, buttocks, bilateral upper extremities, bilateral lower extremities, hands, feet, fingers, toes, fingernails, and toenails. All findings within normal limits unless otherwise noted below.   Relevant physical exam findings are noted in the Assessment and Plan.  scalp x 2 (2) Erythematous thin papules/macules with gritty scale.   Left Temple x 1, L cheek x 1 (2) Erythematous stuck-on, waxy papule or plaque    Assessment & Plan   LENTIGINES, SEBORRHEIC KERATOSES, HEMANGIOMAS - Benign normal skin lesions - Benign-appearing - Call for any changes  MELANOCYTIC NEVI - Tan-brown and/or pink-flesh-colored symmetric macules and papules - Benign appearing on exam today - Observation - Call clinic for new or changing moles - Recommend daily use of broad spectrum spf 30+ sunscreen to sun-exposed areas.   ACTINIC DAMAGE - Chronic condition, secondary to cumulative UV/sun exposure - diffuse scaly erythematous macules with underlying dyspigmentation - Recommend daily broad spectrum sunscreen SPF 30+ to sun-exposed  areas, reapply every 2 hours as needed.  - Staying in the shade or wearing long sleeves, sun glasses (UVA+UVB protection) and wide brim hats (4-inch brim around the entire circumference of the hat) are also recommended for sun protection.  - Call for new or changing lesions.  SKIN CANCER SCREENING PERFORMED TODAY.  HISTORY OF BASAL CELL CARCINOMA OF THE SKIN - No evidence of recurrence today - Recommend regular full body skin exams - Recommend daily broad spectrum sunscreen SPF 30+ to sun-exposed areas, reapply every 2 hours as needed.  - Call if any new or changing lesions are noted between office visits  HISTORY OF SQUAMOUS CELL CARCINOMA OF THE SKIN - No evidence of recurrence today - No lymphadenopathy - Recommend regular full body skin exams - Recommend daily broad spectrum sunscreen SPF 30+ to sun-exposed areas, reapply every 2 hours as needed.  - Call if any new or changing lesions are noted between office visits - Discussed today and will order [facility]-SCC for rapidly growing SCC at the right temple for further prognostic information  AK (actinic keratosis) (2) scalp x 2  Actinic keratoses are precancerous spots that appear secondary to cumulative UV radiation exposure/sun exposure over time. They are chronic with expected duration over 1 year. A portion of actinic keratoses will progress to squamous cell carcinoma of the skin. It is not possible to reliably predict which spots will progress to skin cancer and so treatment is recommended to prevent development of skin cancer.  Recommend daily broad spectrum sunscreen SPF 30+ to sun-exposed areas, reapply every 2 hours as needed.  Recommend staying in the shade or wearing long sleeves, sun glasses (UVA+UVB protection) and  wide brim hats (4-inch brim around the entire circumference of the hat). Call for new or changing lesions.  Prior to procedure, discussed risks of blister formation, small wound, skin dyspigmentation, or rare  scar following cryotherapy. Recommend Vaseline ointment to treated areas while healing.   Destruction of lesion - scalp x 2  Destruction method: cryotherapy   Informed consent: discussed and consent obtained   Lesion destroyed using liquid nitrogen: Yes   Cryotherapy cycles:  2 Outcome: patient tolerated procedure well with no complications   Post-procedure details: wound care instructions given    Inflamed seborrheic keratosis (2) Left Temple x 1, L cheek x 1  Symptomatic, irritating, patient would like treated.  Benign-appearing.  Call clinic for new or changing lesions.   Prior to procedure, discussed risks of blister formation, small wound, skin dyspigmentation, or rare scar following treatment. Recommend Vaseline ointment to treated areas while healing.   Destruction of lesion - Left Temple x 1, L cheek x 1  Destruction method: cryotherapy   Informed consent: discussed and consent obtained   Lesion destroyed using liquid nitrogen: Yes   Cryotherapy cycles:  2 Outcome: patient tolerated procedure well with no complications   Post-procedure details: wound care instructions given     Return in about 6 months (around 10/12/2023) for TBSE, Hx BCC, Hx SCC.  Anise Salvo, RMA, am acting as scribe for Darden Dates, MD .   Documentation: I have reviewed the above documentation for accuracy and completeness, and I agree with the above.  Darden Dates, MD

## 2023-04-11 NOTE — Patient Instructions (Addendum)
Cryotherapy Aftercare  Wash gently with soap and water everyday.   Apply Vaseline and Band-Aid daily until healed.   Recommend taking Heliocare sun protection supplement daily in sunny weather for additional sun protection. For maximum protection on the sunniest days, you can take up to 2 capsules of regular Heliocare OR take 1 capsule of Heliocare Ultra. For prolonged exposure (such as a full day in the sun), you can repeat your dose of the supplement 4 hours after your first dose. Heliocare can be purchased at [facility], at some [facility] or at [email or web address].    Melanoma ABCDEs  Melanoma is the most dangerous type of skin cancer, and is the leading cause of death from skin disease.  You are more likely to develop melanoma if you: Have light-colored skin, light-colored eyes, or red or blond hair Spend a lot of time in the sun Tan regularly, either outdoors or in a tanning bed Have had blistering sunburns, especially during childhood Have a close family member who has had a melanoma Have atypical moles or large birthmarks  Early detection of melanoma is key since treatment is typically straightforward and cure rates are extremely high if we catch it early.   The first sign of melanoma is often a change in a mole or a new dark spot.  The ABCDE system is a way of remembering the signs of melanoma.  A for asymmetry:  The two halves do not match. B for border:  The edges of the growth are irregular. C for color:  A mixture of colors are present instead of an even brown color. D for diameter:  Melanomas are usually (but not always) greater than 6mm - the size of a pencil eraser. E for evolution:  The spot keeps changing in size, shape, and color.  Please check your skin once per month between visits. You can use a small mirror in front and a large mirror behind you to keep an eye on the back side or your body.   If you see any new or changing lesions before your next follow-up,  please call to schedule a visit.  Please continue daily skin protection including broad spectrum sunscreen SPF 30+ to sun-exposed areas, reapplying every 2 hours as needed when you're outdoors.    Due to recent changes in healthcare laws, you may see results of your pathology and/or laboratory studies on MyChart before the doctors have had a chance to review them. We understand that in some cases there may be results that are confusing or concerning to you. Please understand that not all results are received at the same time and often the doctors may need to interpret multiple results in order to provide you with the best plan of care or course of treatment. Therefore, we ask that you please give us 2 business days to thoroughly review all your results before contacting the office for clarification. Should we see a critical lab result, you will be contacted sooner.   If You Need Anything After Your Visit  If you have any questions or concerns for your doctor, please call our main line at [phone] and press option 4 to reach your doctor's medical assistant. If no one answers, please leave a voicemail as directed and we will return your call as soon as possible. Messages left after 4 pm will be answered the following business day.   You may also send us a message via MyChart. We typically respond to MyChart messages within 1-2 business days.    For prescription refills, please ask your pharmacy to contact our office. Our fax number is [phone].  If you have an urgent issue when the clinic is closed that cannot wait until the next business day, you can page your doctor at the number below.    Please note that while we do our best to be available for urgent issues outside of office hours, we are not available 24/7.   If you have an urgent issue and are unable to reach us, you may choose to seek medical care at your doctor's office, retail clinic, urgent care center, or emergency room.  If you  have a medical emergency, please immediately call 911 or go to the emergency department.  Pager Numbers  - Dr. Kowalski: [phone]  - Dr. Moye: [phone]  - Dr. Stewart: [phone]  In the event of inclement weather, please call our main line at [phone] for an update on the status of any delays or closures.  Dermatology Medication Tips: Please keep the boxes that topical medications come in in order to help keep track of the instructions about where and how to use these. Pharmacies typically print the medication instructions only on the boxes and not directly on the medication tubes.   If your medication is too expensive, please contact our office at [phone] option 4 or send us a message through MyChart.   We are unable to tell what your co-pay for medications will be in advance as this is different depending on your insurance coverage. However, we may be able to find a substitute medication at lower cost or fill out paperwork to get insurance to cover a needed medication.   If a prior authorization is required to get your medication covered by your insurance company, please allow us 1-2 business days to complete this process.  Drug prices often vary depending on where the prescription is filled and some pharmacies may offer cheaper prices.  The website www.goodrx.com contains coupons for medications through different pharmacies. The prices here do not account for what the cost may be with help from insurance (it may be cheaper with your insurance), but the website can give you the price if you did not use any insurance.  - You can print the associated coupon and take it with your prescription to the pharmacy.  - You may also stop by our office during regular business hours and pick up a GoodRx coupon card.  - If you need your prescription sent electronically to a different pharmacy, notify our office through [facility] MyChart or by phone at [phone] option  4.     

## 2023-04-22 ENCOUNTER — Telehealth: Payer: Self-pay

## 2023-04-22 NOTE — Telephone Encounter (Signed)
[facility] testing results for a Dr. Neale Burly patient scanned in under the media tab for your review.

## 2023-04-24 ENCOUNTER — Telehealth: Payer: Self-pay

## 2023-04-24 NOTE — Telephone Encounter (Signed)
I called patient and explained results. 2 month followup scheduled with Dr. Gwen Pounds.

## 2023-04-24 NOTE — Telephone Encounter (Signed)
Updated history from [place] testing. aw

## 2023-04-30 ENCOUNTER — Telehealth: Payer: Self-pay | Admitting: Cardiovascular Disease

## 2023-04-30 NOTE — Telephone Encounter (Signed)
Patient said that he started having bilateral hand numbness a week ago and this morning both hand became painful, rt hand more than the left. Pt also stated that as of this morning, his left leg (between ankle and knee) became painful. Pt also stated that he previously had leg pain that started about a week ago, but it went away on its own. Please advise.

## 2023-04-30 NOTE — Telephone Encounter (Signed)
Spoke with the patient and instructed him to reach out to his PCP Manson Allan, NP for the current symptoms of bilateral hand pain and numbness and leg pain. Informed patient that his PCP should assess and evaluate him for those specific symptoms first. Patient understood and stated that he would contact his PCP for an appointment.

## 2023-05-08 ENCOUNTER — Ambulatory Visit: Payer: Medicare PPO | Attending: Cardiovascular Disease | Admitting: Cardiovascular Disease

## 2023-05-08 ENCOUNTER — Encounter: Payer: Self-pay | Admitting: Cardiovascular Disease

## 2023-05-08 VITALS — BP 100/60 | HR 62 | Ht 69.0 in | Wt 167.5 lb

## 2023-05-08 DIAGNOSIS — I25118 Atherosclerotic heart disease of native coronary artery with other forms of angina pectoris: Secondary | ICD-10-CM | POA: Diagnosis not present

## 2023-05-08 DIAGNOSIS — I471 Supraventricular tachycardia, unspecified: Secondary | ICD-10-CM

## 2023-05-08 MED ORDER — TORSEMIDE 10 MG PO TABS: 50.0000 mg | ORAL_TABLET | ORAL | 6 refills | Status: DC

## 2023-05-08 NOTE — Progress Notes (Signed)
Cardiology Office Note  Date:  05/08/2023   ID:  Dylan Weber, DOB 10-02-1933, MRN [id number]  PCP:  Alm Bustard, NP   Chief Complaint  Patient presents with   Follow-up    Patient woke up 1 week ago Monday with numbness in hands & right leg. Medications reviewed by the patient verbally.     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, former smoker x 50+ years,  Admission to the hospital January 2023 feeling faint, presyncope/SVT COVID CT showing PE, lower extremity ultrasound was obtained showing DVT in the right lower extremity.,  off Eliquis now Who presents for follow-up in the office for his PE and SVT/atrial tachycardia  Last seen by myself in clinic August 2023 Has been doing physical therapy  Recent symptoms of bilateral hand pain, reports he was diagnosed with carpal tunnel b/l Better on braces and gabapentin On reflection, was using a exercise  roller at home that put extra strain on his hands and wrists He has stopped using the roller  Chronic leg pain, feels this is from bursitis  Blood pressure running low, occasional orthostasis/dizziness symptoms Currently on torsemide daily, bisoprolol daily 2.5 mg Denies significant breakthrough tachypalpitations  Walks with a cane Off Eliquis, was having significant bruising Not taking spironolactone or Flomax secondary to low blood pressure  EKG personally reviewed by myself on todays visit EKG Interpretation  Date/Time:  Wednesday May 08 2023 16:55:03 EDT Ventricular Rate:  62 PR Interval:  214 QRS Duration: 118 QT Interval:  454 QTC Calculation: 460 R Axis:   -20 Text Interpretation: Sinus rhythm with 1st degree A-V block Right bundle branch block Inferior infarct , age undetermined When compared with ECG of 02-Feb-2022 16:20, Premature ventricular complexes are no longer Present Right bundle branch block has replaced Incomplete  right bundle branch block Borderline criteria for Anterior infarct are no longer Present Inferior infarct is now Present Confirmed by Julien Nordmann [phone]) on 05/08/2023 5:11:20 PM    Was seen by pulmonary February 01, 2022, for low blood pressure ,bisoprolol and spironolactone held  Was seen in the hospital February 02, 2022 Reported systolic pressures at home in the 80s, was asymptomatic On the ER visit blood pressure 121/79, repeat blood pressure 101/79  3/28 and 3/29, brief epsiodes Feels like body is "lighting up like christmas tree" Heart rate up to 100 , baseline 50 to 60 Restarted taking bisoprolol 2.5 daily, symptoms improved  Other past medical history reviewed Copd exaerbation, hospitalized, d/c 01/02/22 Initially presented on 15 L HFNC, weaned down to 2 L steroids, nebs, inhalers and empiric antibiotics  10/17/21 reports seeing pulmonary, COVID-19 positive, COPD exacerbation Treated with PACs Flovent and prednisone  On October 31, 2021 presented to the emergency room with near syncope Saturations 84% on room air Was admitted overnight Treated for atrial tachycardia/SVT, started on Cardizem CD  November 17, 2021 CT scan chest, small PE  November 24, 2021 Admitted to the hospital with near syncope Lower extremity venous Doppler : occlusive right calf vein DVT in the right peroneal vein. No other DVT identified.  For tachycardia diltiazem extended release increased up to 180 mg daily Started on Eliquis at discharge  Echocardiogram reviewed on today's visit November 25, 2021  1. Left ventricular ejection fraction, by estimation, is 55 to 60%. The  left ventricle has normal function. The left ventricle has no regional  wall motion abnormalities. Left  ventricular diastolic parameters are  consistent with Grade I diastolic  dysfunction (impaired relaxation).   2. Right ventricular systolic function is normal. The right ventricular  size is not well visualized.   3. The mitral  valve is grossly normal. No evidence of mitral valve  regurgitation.   4. The aortic valve was not well visualized. Aortic valve regurgitation  is mild.   PMH:   has a past medical history of Basal cell carcinoma (06/19/2022), BCC (basal cell carcinoma) (10/11/2022), COPD (chronic obstructive pulmonary disease) (HCC), Coronary artery calcification seen on CT scan (11/17/2021), COVID-19 (10/2021), Diastolic dysfunction, GERD (gastroesophageal reflux disease), Pulmonary embolism (HCC), Right leg DVT (HCC), SCC (squamous cell carcinoma) (02/20/2023), Squamous cell carcinoma in situ (06/19/2022), and SVT (supraventricular tachycardia).  PSH:    Past Surgical History:  Procedure Laterality Date   APPENDECTOMY     CHOLECYSTECTOMY      Current Outpatient Medications  Medication Sig Dispense Refill   acetaminophen (TYLENOL) 650 MG CR tablet Take 650 mg by mouth as needed for pain.     albuterol (VENTOLIN HFA) 108 (90 Base) MCG/ACT inhaler Inhale 2 puffs into the lungs every 6 (six) hours as needed for wheezing or shortness of breath. 8 g 0   Apoaequorin (PREVAGEN PO) Take by mouth daily.     bacitracin ointment Apply topically 2 (two) times daily.     bisoprolol (ZEBETA) 5 MG tablet Take 0.5 tablet (2.5 mg) by mouth once daily in the morning. You may take an extra 0.5 tablet (2.5 mg) by mouth once daily as needed for episodes of flushing 90 tablet 3   ciclopirox (LOPROX) 0.77 % SUSP Apply to toenails QD-BID. 30 mL 11   COMBIVENT RESPIMAT 20-100 MCG/ACT AERS respimat Inhale 1 puff into the lungs every 6 (six) hours as needed.     gabapentin (NEURONTIN) 100 MG capsule Take 100 mg by mouth at bedtime.     Multiple Vitamin (MULTIVITAMIN) capsule Take 1 capsule by mouth daily.     mupirocin ointment (BACTROBAN) 2 % Apply 1 Application topically daily. 22 g 0   pantoprazole (PROTONIX) 20 MG tablet Take 20 mg by mouth daily.     Probiotic Product (PROBIOTIC ADVANCED PO) Take by mouth daily.      pseudoephedrine-acetaminophen (TYLENOL SINUS) 30-500 MG TABS tablet Take 1 tablet by mouth as needed.     torsemide (DEMADEX) 10 MG tablet Take 5 tablets by mouth daily.     doxepin (SINEQUAN) 10 MG capsule Take 10 mg by mouth at bedtime. (Patient not taking: Reported on 02/09/2022)     No current facility-administered medications for this visit.     Allergies:   Diltiazem, Prednisone, Amoxicillin, and Sulfamethoxazole-trimethoprim   Social History:  The patient  reports that he has quit smoking. His smoking use included cigarettes. He has quit using smokeless tobacco. He reports current alcohol use of about 7.0 standard drinks of alcohol per week. He reports that he does not use drugs.   Family History:   family history includes Brain cancer in his sister; Uterine cancer in his mother.    Review of Systems: Review of Systems  Constitutional: Negative.   HENT: Negative.    Respiratory:  Positive for shortness of breath.   Cardiovascular: Negative.   Gastrointestinal: Negative.   Musculoskeletal: Negative.   Neurological: Negative.   Psychiatric/Behavioral: Negative.    All other systems reviewed and are negative.   PHYSICAL EXAM: VS:  BP 100/60 (BP Location: Left Arm, Patient Position: Sitting, Cuff  Size: Normal)   Pulse 62   Ht 5\' 9"  (1.753 m)   Wt 167 lb 8 oz (76 kg)   BMI 24.74 kg/m  , BMI Body mass index is 24.74 kg/m. Constitutional:  oriented to person, place, and time. No distress.  HENT:  Head: Grossly normal Eyes:  no discharge. No scleral icterus.  Neck: No JVD, no carotid bruits  Cardiovascular: Regular rate and rhythm, no murmurs appreciated Pulmonary/Chest: Clear to auscultation bilaterally, no wheezes or rails Abdominal: Soft.  no distension.  no tenderness.  Musculoskeletal: Normal range of motion Neurological:  normal muscle tone. Coordination normal. No atrophy Skin: Skin warm and dry Psychiatric: normal affect, pleasant   Recent Labs: No results  found for requested labs within last 365 days.    Lipid Panel Lab Results  Component Value Date   CHOL 145 11/25/2021   HDL 57 11/25/2021   LDLCALC 77 11/25/2021   TRIG 53 11/25/2021      Wt Readings from Last 3 Encounters:  05/08/23 167 lb 8 oz (76 kg)  07/10/22 167 lb 6 oz (75.9 kg)  03/13/22 160 lb (72.6 kg)      ASSESSMENT AND PLAN:  Problem List Items Addressed This Visit       Cardiology Problems   CAD (coronary artery disease)   Relevant Medications   torsemide (DEMADEX) 10 MG tablet   Other Relevant Orders   EKG 12-Lead (Completed)   SVT (supraventricular tachycardia) - Primary   Relevant Medications   torsemide (DEMADEX) 10 MG tablet   Other Relevant Orders   EKG 12-Lead (Completed)  SVT/atrial tachycardia Recommend he continue bisoprolol 2.5 daily Without the bisoprolol, previously having breakthrough flushing episodes consistent with SVT/arrhythmia Low blood pressure, will decrease diuretic  Pulmonary embolism Likely in the setting of being sedentary during COVID-19 infection No right heart strain, small on CT Now off Eliquis, secondary to significant bruising  Chronic respiratory distress Long history of smoking, COPD, recent COVID, no PE On oxygen 3 L, Clinically stable, followed by pulmonary CT scan showing significant emphysema Recommend torsemide 10 down to every other day given low blood pressure and mild orthostasis symptoms  Wrist pain Carpal tunnel, better with wrist splints Likely exacerbated by using roller on the ground for exercise   Total encounter time more than 40 minutes  Greater than 50% was spent in counseling and coordination of care with the patient    Signed, Dossie Arbour, M.D., Ph.D. [facility] [place], [place] [phone]

## 2023-05-08 NOTE — Patient Instructions (Addendum)
Medication Instructions:  Torsemide every other day and as needed for shortness of breath or ankle swelling  If you need a refill on your cardiac medications before your next appointment, please call your pharmacy.   Lab work: No new labs needed  Testing/Procedures: No new testing needed  Follow-Up: At [facility], you and your health needs are our priority.  As part of our continuing mission to provide you with exceptional heart care, we have created designated Provider Care Teams.  These Care Teams include your primary Cardiologist (physician) and Advanced Practice Providers (APPs -  Physician Assistants and Nurse Practitioners) who all work together to provide you with the care you need, when you need it.  You will need a follow up appointment in 12 months  Providers on your designated Care Team:   Nicolasa Ducking, NP Eula Listen, PA-C Cadence Fransico [place], [place]  COVID-19 Vaccine Information can be found at: [email or web address] For questions related to vaccine distribution or appointments, please email [email or web address] or call [phone].

## 2023-05-30 ENCOUNTER — Other Ambulatory Visit: Payer: Self-pay | Admitting: Pulmonary Disease

## 2023-05-30 DIAGNOSIS — R918 Other nonspecific abnormal finding of lung field: Secondary | ICD-10-CM

## 2023-06-11 ENCOUNTER — Encounter: Payer: Self-pay | Admitting: Emergency Medicine

## 2023-06-11 ENCOUNTER — Emergency Department
Admission: EM | Admit: 2023-06-11 | Discharge: 2023-06-11 | Disposition: A | Discharge status: Home / Self Care | Payer: Medicare PPO | Source: Home / Self Care | Attending: Emergency Medicine | Admitting: Emergency Medicine

## 2023-06-11 ENCOUNTER — Other Ambulatory Visit: Payer: Self-pay

## 2023-06-11 ENCOUNTER — Emergency Department: Payer: Medicare PPO

## 2023-06-11 DIAGNOSIS — R0602 Shortness of breath: Secondary | ICD-10-CM | POA: Diagnosis present

## 2023-06-11 DIAGNOSIS — Z20822 Contact with and (suspected) exposure to covid-19: Secondary | ICD-10-CM | POA: Insufficient documentation

## 2023-06-11 DIAGNOSIS — J449 Chronic obstructive pulmonary disease, unspecified: Secondary | ICD-10-CM | POA: Diagnosis not present

## 2023-06-11 LAB — BASIC METABOLIC PANEL
Anion gap: 11 (ref 5–15)
BUN: 18 mg/dL (ref 8–23)
CO2: 24 mmol/L (ref 22–32)
Calcium: 9.2 mg/dL (ref 8.9–10.3)
Chloride: 102 mmol/L (ref 98–111)
Creatinine, Ser: 0.91 mg/dL (ref 0.61–1.24)
GFR, Estimated: >60 mL/min (ref >60)
Glucose, Bld: 112 mg/dL — ABNORMAL HIGH (ref 70–99)
Potassium: 4.3 mmol/L (ref 3.5–5.1)
Sodium: 137 mmol/L (ref 135–145)

## 2023-06-11 LAB — CBC
HCT: 42.1 % (ref 39.0–52.0)
Hemoglobin: 14 g/dL (ref 13.0–17.0)
MCH: 30 pg (ref 26.0–34.0)
MCHC: 33.3 g/dL (ref 30.0–36.0)
MCV: 90.3 fL (ref 80.0–100.0)
Platelets: 207 10*3/uL (ref 150–400)
RBC: 4.66 MIL/uL (ref 4.22–5.81)
RDW: 13.8 % (ref 11.5–15.5)
WBC: 9.7 10*3/uL (ref 4.0–10.5)
nRBC: 0 % (ref 0.0–0.2)

## 2023-06-11 LAB — TROPONIN I (HIGH SENSITIVITY)
Troponin I (High Sensitivity): 11 ng/L (ref <18)
Troponin I (High Sensitivity): 12 ng/L (ref <18)

## 2023-06-11 LAB — SARS CORONAVIRUS 2 BY RT PCR: SARS Coronavirus 2 by RT PCR: NEGATIVE

## 2023-06-11 MED ORDER — IOHEXOL 350 MG/ML SOLN
75.0000 mL | Freq: Once | INTRAVENOUS | Status: AC | PRN
  Administered 2023-06-11: 75 mL via INTRAVENOUS

## 2023-06-11 NOTE — ED Notes (Signed)
Pt returned from CT °

## 2023-06-11 NOTE — Discharge Instructions (Signed)
Your lab tests and CT scan today were all okay.  Continue taking your medications as usual and follow-up with your doctor.

## 2023-06-11 NOTE — ED Notes (Signed)
[facility] sent to the lab at this time.  

## 2023-06-11 NOTE — ED Triage Notes (Signed)
Pt to triage via w/c with no distress noted; st since yesterday having SHOB, weakness; denies any recent illness, denies any pain

## 2023-06-11 NOTE — ED Notes (Signed)
Pt verbalizes understanding of discharge instructions. Opportunity for questioning and answers were provided. Pt discharged from ED to home with family.    

## 2023-06-11 NOTE — ED Provider Notes (Signed)
[facility] Provider Note    Event Date/Time   First MD Initiated Contact with Patient 06/11/23 [phone]     (approximate)   History   Chief Complaint: Shortness of Breath   HPI  Dylan Weber is a 89 y.o. male with a history of COPD, GERD, pulmonary embolism who comes ED complaining of shortness of breath this morning when waking up.  Spouse notes he was in his usual state of health until yesterday afternoon when he started complaining of some mild headache, fatigue, chills.  He took Tylenol and Mucinex before bed.  Denies any chest pain.  Currently states that he is feeling better, shortness of breath resolved.  Has been compliant with his medications.  He is on Eliquis.  No exertional symptoms.  Not pleuritic.  They also note that 5 days ago they were in a gathering of people.  No known sick contacts.     Physical Exam   Triage Vital Signs: ED Triage Vitals  Encounter Vitals Group     BP 06/11/23 0648 135/85     Systolic BP Percentile --      Diastolic BP Percentile --      Pulse Rate 06/11/23 0648 81     Resp 06/11/23 0648 20     Temp 06/11/23 0648 98.7 F (37.1 C)     Temp Source 06/11/23 0648 Oral     SpO2 06/11/23 0648 95 %     Weight 06/11/23 0640 162 lb (73.5 kg)     Height 06/11/23 0640 5\' 9"  (1.753 m)     Head Circumference --      Peak Flow --      Pain Score 06/11/23 0640 0     Pain Loc --      Pain Education --      Exclude from Growth Chart --     Most recent vital signs: Vitals:   06/11/23 1110 06/11/23 1112  BP: (!) 125/98   Pulse: 81 78  Resp: 20 20  Temp:    SpO2: 94% 97%    General: Awake, no distress.  CV:  Good peripheral perfusion.  Regular rate and rhythm.  Normal distal pulses Resp:  Normal effort.  Clear to auscultation bilaterally Abd:  No distention.  Soft nontender Other:  No lower extremity edema, no calf tenderness.   ED Results / Procedures / Treatments   Labs (all labs ordered are listed, but only  abnormal results are displayed) Labs Reviewed  BASIC METABOLIC PANEL - Abnormal; Notable for the following components:      Result Value   Glucose, Bld 112 (*)    All other components within normal limits  SARS CORONAVIRUS 2 BY RT PCR  CBC  TROPONIN I (HIGH SENSITIVITY)  TROPONIN I (HIGH SENSITIVITY)     EKG Interpreted by me Sinus rhythm rate of 78.  Normal axis intervals QRS ST segments and T waves   RADIOLOGY Chest x-ray interpreted by me, appears unremarkable.  Radiology report reviewed   PROCEDURES:  Procedures   MEDICATIONS ORDERED IN ED: Medications  iohexol (OMNIPAQUE) 350 MG/ML injection 75 mL (75 mLs Intravenous Contrast Given 06/11/23 1051)     IMPRESSION / MDM / ASSESSMENT AND PLAN / ED COURSE  I reviewed the triage vital signs and the nursing notes.  DDx: COVID, viral illness, pneumonia, pleural effusion, non-STEMI  Patient's presentation is most consistent with acute presentation with potential threat to life or bodily function.  Patient presents with symptoms highly  suggestive of influenza-like illness.  Vital signs are normal, no chest pain, shortness of breath has essentially resolved and he is on Eliquis, highly doubt VTE.  ----------------------------------------- 10:25 AM on 06/11/2023 ----------------------------------------- Patient clarifies that he is not currently taking Eliquis but that has been discontinued.  COVID is negative, all workup so far unrevealing.  With his history of DVT and PE, will need to obtain CT angiogram of the chest to rule out PE today.   ----------------------------------------- 11:42 AM on 06/11/2023 ----------------------------------------- CT negative for PE or other acute findings.  Stable for discharge      FINAL CLINICAL IMPRESSION(S) / ED DIAGNOSES   Final diagnoses:  Shortness of breath  COPD   Rx / DC Orders   ED Discharge Orders     None        Note:  This document was prepared using  Dragon voice recognition software and may include unintentional dictation errors.   Sharman Cheek, MD 06/11/23 [phone]

## 2023-06-11 NOTE — ED Notes (Signed)
Patient to CT at this time

## 2023-06-20 ENCOUNTER — Ambulatory Visit: Payer: Medicare PPO | Admitting: Dermatology

## 2023-06-25 ENCOUNTER — Ambulatory Visit: Payer: Medicare PPO | Admitting: Dermatology

## 2023-06-25 ENCOUNTER — Encounter: Payer: Self-pay | Admitting: Dermatology

## 2023-06-25 DIAGNOSIS — Z85828 Personal history of other malignant neoplasm of skin: Secondary | ICD-10-CM

## 2023-06-25 DIAGNOSIS — L820 Inflamed seborrheic keratosis: Secondary | ICD-10-CM | POA: Diagnosis not present

## 2023-06-25 DIAGNOSIS — Z7189 Other specified counseling: Secondary | ICD-10-CM

## 2023-06-25 DIAGNOSIS — L578 Other skin changes due to chronic exposure to nonionizing radiation: Secondary | ICD-10-CM | POA: Diagnosis not present

## 2023-06-25 DIAGNOSIS — L570 Actinic keratosis: Secondary | ICD-10-CM

## 2023-06-25 DIAGNOSIS — Z8589 Personal history of malignant neoplasm of other organs and systems: Secondary | ICD-10-CM

## 2023-06-25 DIAGNOSIS — W908XXA Exposure to other nonionizing radiation, initial encounter: Secondary | ICD-10-CM | POA: Diagnosis not present

## 2023-06-25 NOTE — Patient Instructions (Addendum)
Cryotherapy Aftercare  Wash gently with soap and water everyday.   Apply Vaseline Jelly daily until healed.    Recommend daily broad spectrum sunscreen SPF 30+ to sun-exposed areas, reapply every 2 hours as needed. Call for new or changing lesions.  Staying in the shade or wearing long sleeves, sun glasses (UVA+UVB protection) and wide brim hats (4-inch brim around the entire circumference of the hat) are also recommended for sun protection.    Due to recent changes in healthcare laws, you may see results of your pathology and/or laboratory studies on MyChart before the doctors have had a chance to review them. We understand that in some cases there may be results that are confusing or concerning to you. Please understand that not all results are received at the same time and often the doctors may need to interpret multiple results in order to provide you with the best plan of care or course of treatment. Therefore, we ask that you please give [place] 2 business days to thoroughly review all your results before contacting the office for clarification. Should we see a critical lab result, you will be contacted sooner.   If You Need Anything After Your Visit  If you have any questions or concerns for your doctor, please call our main line at [phone] and press option 4 to reach your doctor's medical assistant. If no one answers, please leave a voicemail as directed and we will return your call as soon as possible. Messages left after 4 pm will be answered the following business day.   You may also send [place] a message via MyChart. We typically respond to MyChart messages within 1-2 business days.  For prescription refills, please ask your pharmacy to contact our office. Our fax number is [phone].  If you have an urgent issue when the clinic is closed that cannot wait until the next business day, you can page your doctor at the number below.    Please note that while we do our best to be  available for urgent issues outside of office hours, we are not available 24/7.   If you have an urgent issue and are unable to reach [place], you may choose to seek medical care at your doctor's office, retail clinic, urgent care center, or emergency room.  If you have a medical emergency, please immediately call 911 or go to the emergency department.  Pager Numbers  - Dr. Gwen Pounds: [phone]  - Dr. Roseanne Reno: [phone]  - Dr. Katrinka Blazing: [phone]   In the event of inclement weather, please call our main line at [phone] for an update on the status of any delays or closures.  Dermatology Medication Tips: Please keep the boxes that topical medications come in in order to help keep track of the instructions about where and how to use these. Pharmacies typically print the medication instructions only on the boxes and not directly on the medication tubes.   If your medication is too expensive, please contact our office at [phone] option 4 or send [place] a message through MyChart.   We are unable to tell what your co-pay for medications will be in advance as this is different depending on your insurance coverage. However, we may be able to find a substitute medication at lower cost or fill out paperwork to get insurance to cover a needed medication.   If a prior authorization is required to get your medication covered by your insurance company, please allow [place] 1-2 business days to complete this process.  Drug prices often vary depending on where the prescription is filled and some pharmacies may offer cheaper prices.  The website www.goodrx.com contains coupons for medications through different pharmacies. The prices here do not account for what the cost may be with help from insurance (it may be cheaper with your insurance), but the website can give you the price if you did not use any insurance.  - You can print the associated coupon and take it with your prescription to the pharmacy.   - You may also stop by our office during regular business hours and pick up a GoodRx coupon card.  - If you need your prescription sent electronically to a different pharmacy, notify our office through [facility] or by phone at [phone] option 4.     Si Usted Necesita Algo Despus de Su Visita  Tambin puede enviarnos un mensaje a travs de Clinical cytogeneticist. Por lo general respondemos a los mensajes de MyChart en el transcurso de 1 a 2 das hbiles.  Para renovar recetas, por favor pida a su farmacia que se ponga en contacto con nuestra oficina. Annie Sable de fax es [place] [phone].  Si tiene un asunto urgente cuando la clnica est cerrada y que no puede esperar hasta el siguiente da hbil, puede llamar/localizar a su doctor(a) al nmero que aparece a continuacin.   Por favor, tenga en cuenta que aunque hacemos todo lo posible para estar disponibles para asuntos urgentes fuera del horario de [place], no estamos disponibles las 24 horas del da, los 7 [address]  [address] de la [place].   Si tiene un problema urgente y no puede comunicarse con nosotros, puede optar por buscar atencin mdica  en el consultorio de su doctor(a), en una clnica privada, en un centro de atencin urgente o en una sala de emergencias.  Si tiene Engineer, drilling, por favor llame inmediatamente al 911 o vaya a la sala de emergencias.  Nmeros de bper  - Dr. Gwen Pounds: [phone]  - Dra. Roseanne Reno: [phone]  - Dr. Katrinka Blazing: [phone]   En caso de inclemencias del tiempo, por favor llame a Lacy Duverney principal al [phone] para una actualizacin sobre el [place] de cualquier retraso o cierre.  Consejos para la medicacin en dermatologa: Por favor, guarde las cajas en las que vienen los medicamentos de uso tpico para ayudarle a seguir las instrucciones sobre dnde y cmo usarlos. Las farmacias generalmente imprimen las instrucciones del medicamento slo en las cajas y no directamente en los tubos del  [place].   Si su medicamento es muy caro, por favor, pngase en contacto con Rolm Gala llamando al [phone] y presione la opcin 4 o envenos un mensaje a travs de Clinical cytogeneticist.   No podemos decirle cul ser su copago por los medicamentos por adelantado ya que esto es diferente dependiendo de la cobertura de su seguro. Sin embargo, es posible que podamos encontrar un medicamento sustituto a Audiological scientist un formulario para que el seguro cubra el medicamento que se considera necesario.   Si se requiere una autorizacin previa para que su compaa de seguros [place] su medicamento, por favor permtanos de 1 a 2 das hbiles para completar [address]  Los precios de los medicamentos varan con frecuencia dependiendo del Environmental consultant de dnde se surte la receta y alguna farmacias pueden ofrecer precios ms baratos.  El sitio web www.goodrx.com tiene cupones para medicamentos de Health and safety inspector. Los precios aqu no tienen en cuenta lo que podra costar con la ayuda del seguro (puede ser  ms barato con su seguro), pero el sitio web puede darle el precio si no Visual merchandiser.  - Puede imprimir el cupn correspondiente y llevarlo con su receta a la farmacia.  - Tambin puede pasar por nuestra oficina durante el horario de atencin regular y Education officer, museum una tarjeta de cupones de GoodRx.  - Si necesita que su receta se enve electrnicamente a una farmacia diferente, informe a nuestra oficina a travs de MyChart de [place] o por telfono llamando al [phone] y presione la opcin 4.

## 2023-06-25 NOTE — Progress Notes (Signed)
Follow-Up Visit   Subjective  Dylan Weber is a 89 y.o. male who presents for the following: evaluation, discussion and determination of whether oncology referral needed vs observation. 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  The patient has spots, moles and lesions to be evaluated, some may be new or changing and the patient may have concern these could be cancer.  Wife is with patient and contributes to history.  The following portions of the chart were reviewed this encounter and updated as appropriate: medications, allergies, medical history  Review of Systems:  No other skin or systemic complaints except as noted in HPI or Assessment and Plan.  Objective  Well appearing patient in no apparent distress; mood and affect are within normal limits.  A focused examination was performed of the following areas: Scalp, face, neck, axillae  Relevant exam findings are noted in the Assessment and Plan.  right nose supratip x1 Erythematous thin papules/macules with gritty scale.   Scalp x1 Erythematous keratotic or waxy stuck-on papule or plaque.   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  Discussed Dylan Weber Test results with patient and his wife.  Dr Dylan Weber will discuss case with oncology at [facility] Will let patient know if referral to oncology is needed.  - No evidence of recurrence today - No lymphadenopathy - Recommend regular full body skin exams - Recommend daily broad spectrum sunscreen SPF 30+ to sun-exposed areas, reapply every 2 hours as needed.  - Call if any new or changing lesions are noted between office visits  AK (actinic keratosis) right nose supratip x1  Actinic keratoses are precancerous spots that appear secondary to cumulative UV radiation exposure/sun exposure over time. They are chronic with expected duration over 1 year. A portion of actinic keratoses will  progress to squamous cell carcinoma of the skin. It is not possible to reliably predict which spots will progress to skin cancer and so treatment is recommended to prevent development of skin cancer.  Recommend daily broad spectrum sunscreen SPF 30+ to sun-exposed areas, reapply every 2 hours as needed.  Recommend staying in the shade or wearing long sleeves, sun glasses (UVA+UVB protection) and wide brim hats (4-inch brim around the entire circumference of the hat). Call for new or changing lesions.  ACTINIC DAMAGE - chronic, secondary to cumulative UV radiation exposure/sun exposure over time - diffuse scaly erythematous macules with underlying dyspigmentation - Recommend daily broad spectrum sunscreen SPF 30+ to sun-exposed areas, reapply every 2 hours as needed.  - Recommend staying in the shade or wearing long sleeves, sun glasses (UVA+UVB protection) and wide brim hats (4-inch brim around the entire circumference of the hat). - Call for new or changing lesions.   Destruction of lesion - right nose supratip x1 Complexity: simple   Destruction method: cryotherapy   Informed consent: discussed and consent obtained   Timeout:  patient name, date of birth, surgical site, and procedure verified Lesion destroyed using liquid nitrogen: Yes   Region frozen until ice ball extended beyond lesion: Yes   Outcome: patient tolerated procedure well with no complications   Post-procedure details: wound care instructions given   Additional details:  Prior to procedure, discussed risks of blister formation, small wound, skin dyspigmentation, or rare scar following cryotherapy. Recommend Vaseline ointment to treated areas while healing.   Inflamed seborrheic keratosis Scalp x1  Symptomatic, irritating, patient would like treated.  Destruction of lesion -  Scalp x1 Complexity: simple   Destruction method: cryotherapy   Informed consent: discussed and consent obtained   Timeout:  patient name, date  of birth, surgical site, and procedure verified Lesion destroyed using liquid nitrogen: Yes   Region frozen until ice ball extended beyond lesion: Yes   Outcome: patient tolerated procedure well with no complications   Post-procedure details: wound care instructions given   Additional details:  Prior to procedure, discussed risks of blister formation, small wound, skin dyspigmentation, or rare scar following cryotherapy. Recommend Vaseline ointment to treated areas while healing.    Return for TBSE As Scheduled.  I, Lawson Radar, CMA, am acting as scribe for Dylan Sans, MD.  Documentation: I have reviewed the above documentation for accuracy and completeness, and I agree with the above.  Dylan Sans, MD

## 2023-07-03 ENCOUNTER — Other Ambulatory Visit: Payer: Medicare PPO

## 2023-07-05 ENCOUNTER — Encounter: Payer: Self-pay | Admitting: Dermatology

## 2023-07-11 ENCOUNTER — Other Ambulatory Visit: Payer: Self-pay

## 2023-07-11 DIAGNOSIS — C4492 Squamous cell carcinoma of skin, unspecified: Secondary | ICD-10-CM

## 2023-07-11 NOTE — Progress Notes (Signed)
Referral to oncology.  Referral for evaluation for any further recommendations for Squamous Cell Carcinoma of temple / Dylan Weber testing showing 2A = higher risk of metastasis within 3 years. Cancer is S/P excision by MOHS surgery - margins clear.-- No lymphadenopathy palpable at last Dermatology visit 06/25/2023

## 2023-07-16 ENCOUNTER — Inpatient Hospital Stay: Payer: Medicare PPO | Attending: Oncology | Admitting: Oncology

## 2023-07-16 ENCOUNTER — Encounter: Payer: Self-pay | Admitting: Oncology

## 2023-07-16 ENCOUNTER — Inpatient Hospital Stay: Payer: Medicare PPO

## 2023-07-16 VITALS — BP 92/66 | HR 64 | Temp 97.8°F | Resp 16 | Ht 69.0 in | Wt 160.0 lb

## 2023-07-16 DIAGNOSIS — Z79899 Other long term (current) drug therapy: Secondary | ICD-10-CM | POA: Insufficient documentation

## 2023-07-16 DIAGNOSIS — Z87891 Personal history of nicotine dependence: Secondary | ICD-10-CM | POA: Diagnosis not present

## 2023-07-16 DIAGNOSIS — Z808 Family history of malignant neoplasm of other organs or systems: Secondary | ICD-10-CM

## 2023-07-16 DIAGNOSIS — Z85828 Personal history of other malignant neoplasm of skin: Secondary | ICD-10-CM | POA: Insufficient documentation

## 2023-07-16 DIAGNOSIS — C4492 Squamous cell carcinoma of skin, unspecified: Secondary | ICD-10-CM | POA: Insufficient documentation

## 2023-07-16 NOTE — Progress Notes (Signed)
[facility]  Telephone:([phone]) [phone] Fax:(336) [phone]  ID: Dylan Weber OB: 1933-05-10  MR#: [id number]  RJJ#:[id number]  Patient Care Team: Alm Bustard, NP as PCP - General (Family Medicine) Antonieta Iba, MD as PCP - Cardiology (Cardiology)  CHIEF COMPLAINT: Squamous cell carcinoma of the scalp.  INTERVAL HISTORY: Patient is an 89 year old male who recently underwent Mohs surgery for a squamous cell carcinoma on his right temple.  Further testing using a "Castle test" which assess for risk of metastasis returned class 2a, moderate risk of metastasis.  Patient is sent for further evaluation.  He currently feels well and is asymptomatic.  He has no neurologic complaints.  He denies any recent fevers or illnesses.  He has a good appetite and denies weight loss.  He has no chest pain, shortness of breath, cough, or hemoptysis.  He denies any nausea, vomiting, constipation, or diarrhea.  He has no urinary complaints.  Patient feels at his baseline and offers no specific complaints today.  REVIEW OF SYSTEMS:   Review of Systems  Constitutional: Negative.  Negative for fever, malaise/fatigue and weight loss.  Respiratory: Negative.  Negative for cough, hemoptysis and shortness of breath.   Cardiovascular: Negative.  Negative for chest pain and leg swelling.  Gastrointestinal: Negative.  Negative for abdominal pain.  Genitourinary: Negative.  Negative for dysuria.  Musculoskeletal: Negative.  Negative for back pain.  Skin: Negative.  Negative for rash.  Neurological: Negative.  Negative for dizziness, focal weakness, weakness and headaches.  Psychiatric/Behavioral: Negative.  The patient is not nervous/anxious.     As per HPI. Otherwise, a complete review of systems is negative.  PAST MEDICAL HISTORY: Past Medical History:  Diagnosis Date   Basal cell carcinoma 06/19/2022   Dorsum nose. Nodular. EDC 07/17/2022   BCC (basal cell carcinoma) 10/11/2022   right  nasal supratip, superficial and nodular, EDC 12/19/22   COPD (chronic obstructive pulmonary disease) (HCC)    Coronary artery calcification seen on CT scan 11/17/2021   COVID-19 10/2021   Diastolic dysfunction    a. 11/2021 Echo: EF 55-60%, no rwma, GrI DD, nl RV fxn. Mild AI.   GERD (gastroesophageal reflux disease)    Pulmonary embolism (HCC)    a. 11/2021 CTA Chest: small peripheral PE in RLL branch of PA-->Eliquis.   Right leg DVT (HCC)    a. 11/2021 LE U/S: occlusive R calf vein DVT in R peroneal vein-->eliquis.   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 EDC 07/17/2022   SVT (supraventricular tachycardia)    a. Dx 10/2021 in setting of COVID infxn.    PAST SURGICAL HISTORY: Past Surgical History:  Procedure Laterality Date   APPENDECTOMY     CHOLECYSTECTOMY     EYE SURGERY      FAMILY HISTORY: Family History  Problem Relation Age of Onset   Uterine cancer Mother    Brain cancer Sister     ADVANCED DIRECTIVES (Y/N):  N  HEALTH MAINTENANCE: Social History   Tobacco Use   Smoking status: Former    Types: Cigarettes   Smokeless tobacco: Former  Building services engineer status: Never Used  Substance Use Topics   Alcohol use: Yes    Alcohol/week: 7.0 standard drinks of alcohol    Types: 7 Shots of liquor per week   Drug use: Never     Colonoscopy:  PAP:  Bone  density:  Lipid panel:  Allergies  Allergen Reactions   Diltiazem Other (See Comments)    Flushing/redness to body   Prednisone Other (See Comments)    jittery   Amoxicillin Rash   Sulfamethoxazole-Trimethoprim Rash    Current Outpatient Medications  Medication Sig Dispense Refill   acetaminophen (TYLENOL) 650 MG CR tablet Take 650 mg by mouth as needed for pain.     albuterol (VENTOLIN HFA) 108 (90 Base) MCG/ACT inhaler Inhale 2 puffs into the lungs every 6 (six) hours as needed for  wheezing or shortness of breath. 8 g 0   Apoaequorin (PREVAGEN PO) Take by mouth daily.     bisoprolol (ZEBETA) 5 MG tablet Take 0.5 tablet (2.5 mg) by mouth once daily in the morning. You may take an extra 0.5 tablet (2.5 mg) by mouth once daily as needed for episodes of flushing 90 tablet 3   ciclopirox (LOPROX) 0.77 % SUSP Apply to toenails QD-BID. 30 mL 11   COMBIVENT RESPIMAT 20-100 MCG/ACT AERS respimat Inhale 1 puff into the lungs every 6 (six) hours as needed.     gabapentin (NEURONTIN) 100 MG capsule Take 100 mg by mouth at bedtime.     Multiple Vitamin (MULTIVITAMIN) capsule Take 1 capsule by mouth daily.     mupirocin ointment (BACTROBAN) 2 % Apply 1 Application topically daily. 22 g 0   pantoprazole (PROTONIX) 20 MG tablet Take 20 mg by mouth daily.     Probiotic Product (PROBIOTIC ADVANCED PO) Take by mouth daily.     torsemide (DEMADEX) 10 MG tablet Take 5 tablets (50 mg total) by mouth every other day. May take 50 mg on off days as needed for swelling. 100 tablet 6   doxepin (SINEQUAN) 10 MG capsule Take 10 mg by mouth at bedtime. (Patient not taking: Reported on 02/09/2022)     No current facility-administered medications for this visit.    OBJECTIVE: Vitals:   07/16/23 1456  BP: 92/66  Pulse: 64  Resp: 16  Temp: 97.8 F (36.6 C)  SpO2: 95%     Body mass index is 23.63 kg/m.    ECOG FS:0 - Asymptomatic  General: Well-developed, well-nourished, no acute distress. Eyes: Pink conjunctiva, anicteric sclera. HEENT: Normocephalic, moist mucous membranes.  No obvious evidence of local recurrence.  No palpable lymphadenopathy. Lungs: No audible wheezing or coughing. Heart: Regular rate and rhythm. Abdomen: Soft, nontender, no obvious distention. Musculoskeletal: No edema, cyanosis, or clubbing. Neuro: Alert, answering all questions appropriately. Cranial nerves grossly intact. Skin: No rashes or petechiae noted. Psych: Normal affect.  LAB RESULTS:  Lab Results   Component Value Date   NA 137 06/11/2023   K 4.3 06/11/2023   CL 102 06/11/2023   CO2 24 06/11/2023   GLUCOSE 112 (H) 06/11/2023   BUN 18 06/11/2023   CREATININE 0.91 06/11/2023   CALCIUM 9.2 06/11/2023   PROT 7.8 12/31/2021   ALBUMIN 4.1 12/31/2021   AST 24 12/31/2021   ALT 21 12/31/2021   ALKPHOS 64 12/31/2021   BILITOT 1.0 12/31/2021   GFRNONAA >60 06/11/2023    Lab Results  Component Value Date   WBC 9.7 06/11/2023   NEUTROABS 4.6 02/02/2022   HGB 14.0 06/11/2023   HCT 42.1 06/11/2023   MCV 90.3 06/11/2023   PLT 207 06/11/2023     STUDIES: No results found.  ASSESSMENT: Squamous cell carcinoma of the scalp.  PLAN:    Squamous cell carcinoma of the scalp: No evidence of disease.  Patient has not  had any imaging.  The "[name] test" is a 40 gene expression profile to assess risk of metastatic disease for squamous cell carcinoma of the skin.  Patient was reported as a class 2a, which is moderate risk.  We discussed the likely areas of metastasis which would be painless lymphadenopathy on the right side of his neck.  We also discussed the possibility of either active surveillance or possibly doing a PET scan for completeness. Patient declined any imaging and states he will follow-up with his dermatologist as well as his primary care regularly.  No follow-up has been scheduled.  I spent a total of 45 minutes reviewing chart data, face-to-face evaluation with the patient, counseling and coordination of care as detailed above.   Patient expressed understanding and was in agreement with this plan. He also understands that He can call clinic at any time with any questions, concerns, or complaints.    Jeralyn Ruths, MD   07/16/2023 3:59 PM

## 2023-10-03 ENCOUNTER — Ambulatory Visit: Payer: Medicare PPO | Admitting: Dermatology

## 2023-10-03 ENCOUNTER — Encounter: Payer: Self-pay | Admitting: Dermatology

## 2023-10-03 DIAGNOSIS — M67422 Ganglion, left elbow: Secondary | ICD-10-CM

## 2023-10-03 DIAGNOSIS — L814 Other melanin hyperpigmentation: Secondary | ICD-10-CM | POA: Diagnosis not present

## 2023-10-03 DIAGNOSIS — I781 Nevus, non-neoplastic: Secondary | ICD-10-CM

## 2023-10-03 DIAGNOSIS — Z1283 Encounter for screening for malignant neoplasm of skin: Secondary | ICD-10-CM | POA: Diagnosis not present

## 2023-10-03 DIAGNOSIS — L578 Other skin changes due to chronic exposure to nonionizing radiation: Secondary | ICD-10-CM

## 2023-10-03 DIAGNOSIS — D1801 Hemangioma of skin and subcutaneous tissue: Secondary | ICD-10-CM

## 2023-10-03 DIAGNOSIS — I788 Other diseases of capillaries: Secondary | ICD-10-CM

## 2023-10-03 DIAGNOSIS — D692 Other nonthrombocytopenic purpura: Secondary | ICD-10-CM

## 2023-10-03 DIAGNOSIS — L821 Other seborrheic keratosis: Secondary | ICD-10-CM

## 2023-10-03 DIAGNOSIS — Z86007 Personal history of in-situ neoplasm of skin: Secondary | ICD-10-CM

## 2023-10-03 DIAGNOSIS — W908XXA Exposure to other nonionizing radiation, initial encounter: Secondary | ICD-10-CM | POA: Diagnosis not present

## 2023-10-03 DIAGNOSIS — M6740 Ganglion, unspecified site: Secondary | ICD-10-CM

## 2023-10-03 DIAGNOSIS — L700 Acne vulgaris: Secondary | ICD-10-CM

## 2023-10-03 DIAGNOSIS — Z872 Personal history of diseases of the skin and subcutaneous tissue: Secondary | ICD-10-CM

## 2023-10-03 DIAGNOSIS — Z85828 Personal history of other malignant neoplasm of skin: Secondary | ICD-10-CM

## 2023-10-03 DIAGNOSIS — D229 Melanocytic nevi, unspecified: Secondary | ICD-10-CM

## 2023-10-03 DIAGNOSIS — L720 Epidermal cyst: Secondary | ICD-10-CM

## 2023-10-03 NOTE — Patient Instructions (Signed)

## 2023-10-03 NOTE — Progress Notes (Signed)
Follow-Up Visit   Subjective  Dylan Weber is a 89 y.o. male who presents for the following: Skin Cancer Screening and Full Body Skin Exam. HxBCC, HxSCC. Hx of [place] testing, 2A. Hx of AKs.   The patient presents for Total-Body Skin Exam (TBSE) for skin cancer screening and mole check. The patient has spots, moles and lesions to be evaluated, some may be new or changing and the patient may have concern these could be cancer.  Wife is with patient and contributes to history.   The following portions of the chart were reviewed this encounter and updated as appropriate: medications, allergies, medical history  Review of Systems:  No other skin or systemic complaints except as noted in HPI or Assessment and Plan.  Objective  Well appearing patient in no apparent distress; mood and affect are within normal limits.  A full examination was performed including scalp, head, eyes, ears, nose, lips, neck, chest, axillae, abdomen, back, buttocks, bilateral upper extremities, bilateral lower extremities, hands, feet, fingers, toes, fingernails, and toenails. All findings within normal limits unless otherwise noted below.   Relevant physical exam findings are noted in the Assessment and Plan.    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  - No evidence of recurrence today - No lymphadenopathy - Recommend regular full body skin exams - Recommend daily broad spectrum sunscreen SPF 30+ to sun-exposed areas, reapply every 2 hours as needed.  - Call if any new or changing lesions are noted between office visits  HISTORY OF SQUAMOUS CELL CARCINOMA IN SITU OF THE SKIN. Right occipital scalp. [facility] 07/17/2022. - No evidence of recurrence today - Recommend regular full body skin exams - Recommend daily broad spectrum sunscreen SPF 30+ to sun-exposed areas, reapply every 2 hours as needed.  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 Nodular. [facility] 07/17/2022.  Right nasal supratip, superficial and nodular, [facility] 12/19/22  - No evidence of recurrence today - Recommend regular full body skin exams - Recommend daily broad spectrum sunscreen SPF 30+ to sun-exposed areas, reapply every 2 hours as needed.  - Call if any new or changing lesions are noted between office visits     SKIN CANCER SCREENING PERFORMED TODAY.  ACTINIC DAMAGE - Chronic condition, secondary to cumulative UV/sun exposure - diffuse scaly erythematous macules with underlying dyspigmentation - Recommend daily broad spectrum sunscreen SPF 30+ to sun-exposed areas, reapply every 2 hours as needed.  - Staying in the shade or wearing long sleeves, sun glasses (UVA+UVB protection) and wide brim hats (4-inch brim around the entire circumference of the hat) are also recommended for sun protection.  - Call for new or changing lesions.  LENTIGINES, SEBORRHEIC KERATOSES, HEMANGIOMAS - Benign normal skin lesions - Benign-appearing - Call for any changes  MELANOCYTIC NEVI - Tan-brown and/or pink-flesh-colored symmetric macules and papules - Benign appearing on exam today - Observation - Call clinic for new or changing moles - Recommend daily use of broad spectrum spf 30+ sunscreen to sun-exposed areas.   Purpura - Chronic; persistent and recurrent.  Treatable, but not curable. - Violaceous macules and patches - Benign - Related to trauma, age, sun damage and/or use of blood thinners, chronic use of topical and/or oral steroids - Observe - Can use OTC arnica containing moisturizer such as Dermend Bruise Formula if desired -  Call for worsening or other concerns  Capillaritis  Exam: rust colored macules at B/L lower legs  Treatment:  Benign-appearing.  Observation.  Call clinic for new or changing lesions.  Recommend daily use of broad spectrum spf 30+ sunscreen to sun-exposed  areas. Very difficult to treat pigmentation  Ganglion Cyst vs bursa vs other  Exam: soft movable subcutaneous plaque at left elbow extending to forearm  Treatment:  Advised can send for an ultrasound to see if communicates with joint. Patient deferred treatment at this time.   Open Comedone  Exam: open comedone at left zygomatic area.  Treatment:  Comedone extraction x1  EPIDERMAL INCLUSION CYST Exam: Subcutaneous nodule at left earlobe  Benign-appearing. Exam most consistent with an epidermal inclusion cyst. Discussed that a cyst is a benign growth that can grow over time and sometimes get irritated or inflamed. Recommend observation if it is not bothersome. Discussed option of surgical excision to remove it if it is growing, symptomatic, or other changes noted. Please call for new or changing lesions so they can be evaluated.  TELANGIECTASIA Exam: dilated blood vessel(s) on left infraorbital/zygoma  Treatment Plan: Benign appearing on exam Call for changes   Return in about 6 months (around 04/01/2024) for TBSE, HxSCC, HxSCCis, HxBCC.  I, Lawson Radar, CMA, am acting as scribe for Elie Goody, MD.   Documentation: I have reviewed the above documentation for accuracy and completeness, and I agree with the above.  Elie Goody, MD

## 2023-12-05 ENCOUNTER — Other Ambulatory Visit: Payer: Self-pay | Admitting: Cardiovascular Disease

## 2024-01-29 ENCOUNTER — Encounter (INDEPENDENT_AMBULATORY_CARE_PROVIDER_SITE_OTHER): Payer: Medicare PPO | Admitting: Nurse Practitioner

## 2024-01-29 ENCOUNTER — Other Ambulatory Visit: Payer: Self-pay

## 2024-01-29 ENCOUNTER — Encounter (INDEPENDENT_AMBULATORY_CARE_PROVIDER_SITE_OTHER): Payer: Self-pay

## 2024-01-29 ENCOUNTER — Emergency Department
Admission: EM | Admit: 2024-01-29 | Discharge: 2024-01-29 | Disposition: A | Discharge status: Home / Self Care | Attending: Emergency Medicine | Admitting: Emergency Medicine

## 2024-01-29 DIAGNOSIS — W19XXXA Unspecified fall, initial encounter: Secondary | ICD-10-CM | POA: Insufficient documentation

## 2024-01-29 DIAGNOSIS — Z23 Encounter for immunization: Secondary | ICD-10-CM | POA: Insufficient documentation

## 2024-01-29 DIAGNOSIS — S51811A Laceration without foreign body of right forearm, initial encounter: Secondary | ICD-10-CM | POA: Diagnosis not present

## 2024-01-29 DIAGNOSIS — Y92096 Garden or yard of other non-institutional residence as the place of occurrence of the external cause: Secondary | ICD-10-CM | POA: Insufficient documentation

## 2024-01-29 DIAGNOSIS — S59911A Unspecified injury of right forearm, initial encounter: Secondary | ICD-10-CM | POA: Diagnosis present

## 2024-01-29 MED ORDER — LIDOCAINE HCL (PF) 1 % IJ SOLN
5.0000 mL | Freq: Once | INTRAMUSCULAR | Status: AC
  Administered 2024-01-29: 5 mL
  Filled 2024-01-29: qty 5

## 2024-01-29 MED ORDER — TETANUS-DIPHTH-ACELL PERTUSSIS 5-2.5-18.5 LF-MCG/0.5 IM SUSY
0.5000 mL | PREFILLED_SYRINGE | Freq: Once | INTRAMUSCULAR | Status: AC
  Administered 2024-01-29: 0.5 mL via INTRAMUSCULAR
  Filled 2024-01-29: qty 0.5

## 2024-01-29 NOTE — ED Notes (Signed)
 RN to bedside to introduce self to pt. Pt is caox4, in no acute distress. Wound bandaged and covered, bleeding controlled. Family at bedside. Pt denies hitting his head when he fell and fall occurred at around 2pm. Went to [facility] and they sent him here.

## 2024-01-29 NOTE — ED Provider Notes (Signed)
 [facility] Provider Note    Event Date/Time   First MD Initiated Contact with Patient 01/29/24 1748     (approximate)   History   Laceration   HPI  Dylan Weber is a 89 y.o. male who presents today with laceration and avulsion of the skin after falling in his garden.  Patient cannot remember last Tdap.  Patient is not taking  blood thinners.  Patient did not have any head trauma     Physical Exam   Triage Vital Signs: ED Triage Vitals [01/29/24 1541]  Encounter Vitals Group     BP 107/67     Systolic BP Percentile      Diastolic BP Percentile      Pulse Rate 68     Resp 20     Temp 98 F (36.7 C)     Temp Source Oral     SpO2 98 %     Weight      Height      Head Circumference      Peak Flow      Pain Score 2     Pain Loc      Pain Education      Exclude from Growth Chart     Most recent vital signs: Vitals:   01/29/24 1541  BP: 107/67  Pulse: 68  Resp: 20  Temp: 98 F (36.7 C)  SpO2: 98%     Constitutional: Alert, NAD. Able to speak in complete sentences without cough or dyspnea  Eyes: Conjunctivae are normal.  Head: Atraumatic. Nose: No congestion/rhinnorhea. Mouth/Throat: Mucous membranes are moist.   Neck: Painless ROM. Supple. No JVD, nodes, thyromegaly  Cardiovascular:   Good peripheral circulation.RRR no murmurs, gallops, rubs  Respiratory: Normal respiratory effort.  No retractions. Clear to auscultation bilaterally without wheezing or crackles  Gastrointestinal: Soft and nontender.  Musculoskeletal:  no deformity Neurologic:  MAE spontaneously. No gross focal neurologic deficits are appreciated.  Skin:  Skin is warm, dry and intact. No rash noted. Right forearm: Laceration in the mid third of dorsal area, avulsion and abrasion of the skin about 5 cm.  Laceration in the lateral dorsal wrist about 2 cm in L shape. Left knee laceration 1 cm superficial.  No active bleeding Psychiatric: Mood and affect are normal.  Speech and behavior are normal.    ED Results / Procedures / Treatments   Labs (all labs ordered are listed, but only abnormal results are displayed) Labs Reviewed - No data to display   EKG     RADIOLOGY    PROCEDURES:  Critical Care performed:   .Laceration Repair  Date/Time: 01/29/2024 7:37 PM  Performed by: Gladys Damme, PA-C Authorized by: Gladys Damme, PA-C   Consent:    Consent obtained:  Verbal   Consent given by:  Patient   Risks, benefits, and alternatives were discussed: yes     Risks discussed:  Infection and pain Universal protocol:    Procedure explained and questions answered to patient or proxy's satisfaction: yes     Patient identity confirmed:  Verbally with patient Anesthesia:    Anesthesia method:  Local infiltration   Local anesthetic:  Lidocaine 1% WITH epi Laceration details:    Location:  Shoulder/arm   Shoulder/arm location:  R upper arm   Length (cm):  4   Depth (mm):  2 Pre-procedure details:    Preparation:  Patient was prepped and draped in usual sterile fashion Exploration:    Hemostasis achieved with:  Direct pressure Treatment:    Area cleansed with:  Povidone-iodine   Irrigation solution:  Sterile saline   Irrigation method:  Pressure wash   Visualized foreign bodies/material removed: no     Debridement:  Minimal Skin repair:    Repair method:  Sutures   Suture size:  6-0   Suture material:  Nylon   Number of sutures:  11 Approximation:    Approximation:  Close Repair type:    Repair type:  Simple Post-procedure details:    Dressing:  Non-adherent dressing   Procedure completion:  Tolerated well, no immediate complications    MEDICATIONS ORDERED IN ED: Medications  lidocaine (PF) (XYLOCAINE) 1 % injection 5 mL (has no administration in time range)  Tdap (BOOSTRIX) injection 0.5 mL (0.5 mLs Intramuscular Given 01/29/24 1838)      IMPRESSION / MDM / ASSESSMENT AND PLAN / ED COURSE  I reviewed the triage  vital signs and the nursing notes.  Differential diagnosis includes, but is not limited to, sedation, avulsion, ideation, fracture  Patient's presentation is most consistent with acute, uncomplicated illness.   Patient's diagnosis is consistent with laceration right forearm, avulsion of the skin right forearm, laceration right wrist, laceration left knee. I did review the patient's allergies and medications.The patient is in stable and satisfactory condition for discharge home.  Admission patient received Tdap booster.  Patient will be discharged home with prescriptions. Patient is to follow up with wound center as needed or otherwise directed. Patient is given ED precautions to return to the ED for any worsening or new symptoms. Discussed plan of care with patient, answered all of patient's questions, Patient agreeable to plan of care. Advised patient to take medications according to the instructions on the label. Discussed possible side effects of new medications. Patient verbalized understanding.    FINAL CLINICAL IMPRESSION(S) / ED DIAGNOSES   Final diagnoses:  Laceration of right forearm, initial encounter     Rx / DC Orders   ED Discharge Orders     None        Note:  This document was prepared using Dragon voice recognition software and may include unintentional dictation errors.   Gladys Damme, PA-C 01/29/24 1940    Minna Antis, MD 01/29/24 2315

## 2024-01-29 NOTE — ED Triage Notes (Signed)
 Pt to ED via POV from [facility]. Pt reports around 2pm tripped and fell and hit the corner of a brick wall. Pt sent for possible stiches.

## 2024-01-29 NOTE — Discharge Instructions (Addendum)
 Have been diagnosed with laceration in the right forearm.  Please do not submerge your arm in water to prevent infection.  Please call and make an appointment with the wound center for dressing change.  Please come back to ED with your PCP if you have new symptoms or symptoms worsen.  Please go to urgent care or your PCP to remove sutures on Monday.

## 2024-01-29 NOTE — ED Triage Notes (Signed)
 First Nurse Note:  Pt via POV from [facility]. Pt was working in the yard, mechanical fall. Pt has a skin tear to the R forearm, abrasion to R forearm. Denies LOC. Denies blood thinners. [facility] sent over for possible stitches. Pt is A&Ox4 and NAD

## 2024-01-31 ENCOUNTER — Encounter: Attending: Physician Assistant | Admitting: Physician Assistant

## 2024-01-31 DIAGNOSIS — Z86711 Personal history of pulmonary embolism: Secondary | ICD-10-CM | POA: Insufficient documentation

## 2024-01-31 DIAGNOSIS — J4489 Other specified chronic obstructive pulmonary disease: Secondary | ICD-10-CM | POA: Insufficient documentation

## 2024-01-31 DIAGNOSIS — Z7901 Long term (current) use of anticoagulants: Secondary | ICD-10-CM | POA: Insufficient documentation

## 2024-01-31 DIAGNOSIS — S81812A Laceration without foreign body, left lower leg, initial encounter: Secondary | ICD-10-CM | POA: Diagnosis not present

## 2024-01-31 DIAGNOSIS — I2510 Atherosclerotic heart disease of native coronary artery without angina pectoris: Secondary | ICD-10-CM | POA: Insufficient documentation

## 2024-01-31 DIAGNOSIS — S61511A Laceration without foreign body of right wrist, initial encounter: Secondary | ICD-10-CM | POA: Diagnosis not present

## 2024-01-31 DIAGNOSIS — S51811A Laceration without foreign body of right forearm, initial encounter: Secondary | ICD-10-CM | POA: Diagnosis present

## 2024-01-31 DIAGNOSIS — X58XXXA Exposure to other specified factors, initial encounter: Secondary | ICD-10-CM | POA: Insufficient documentation

## 2024-02-12 ENCOUNTER — Encounter: Attending: Physician Assistant | Admitting: Physician Assistant

## 2024-02-12 DIAGNOSIS — J449 Chronic obstructive pulmonary disease, unspecified: Secondary | ICD-10-CM | POA: Diagnosis not present

## 2024-02-12 DIAGNOSIS — S61511A Laceration without foreign body of right wrist, initial encounter: Secondary | ICD-10-CM | POA: Insufficient documentation

## 2024-02-12 DIAGNOSIS — S51811A Laceration without foreign body of right forearm, initial encounter: Secondary | ICD-10-CM | POA: Insufficient documentation

## 2024-02-12 DIAGNOSIS — I2510 Atherosclerotic heart disease of native coronary artery without angina pectoris: Secondary | ICD-10-CM | POA: Insufficient documentation

## 2024-02-12 DIAGNOSIS — S81812A Laceration without foreign body, left lower leg, initial encounter: Secondary | ICD-10-CM | POA: Diagnosis not present

## 2024-02-12 DIAGNOSIS — Z86711 Personal history of pulmonary embolism: Secondary | ICD-10-CM | POA: Insufficient documentation

## 2024-02-20 ENCOUNTER — Encounter: Admitting: Physician Assistant

## 2024-02-20 DIAGNOSIS — S51811A Laceration without foreign body of right forearm, initial encounter: Secondary | ICD-10-CM | POA: Diagnosis not present

## 2024-02-28 ENCOUNTER — Encounter

## 2024-02-28 DIAGNOSIS — S51811A Laceration without foreign body of right forearm, initial encounter: Secondary | ICD-10-CM | POA: Diagnosis not present

## 2024-03-02 ENCOUNTER — Encounter: Payer: Self-pay | Admitting: Cardiovascular Disease

## 2024-03-04 ENCOUNTER — Other Ambulatory Visit: Payer: Self-pay | Admitting: Cardiovascular Disease

## 2024-03-05 ENCOUNTER — Encounter: Admitting: Physician Assistant

## 2024-03-05 DIAGNOSIS — S51811A Laceration without foreign body of right forearm, initial encounter: Secondary | ICD-10-CM | POA: Diagnosis not present

## 2024-03-21 ENCOUNTER — Emergency Department

## 2024-03-21 ENCOUNTER — Other Ambulatory Visit: Payer: Self-pay

## 2024-03-21 ENCOUNTER — Inpatient Hospital Stay
Admission: EM | Admit: 2024-03-21 | Discharge: 2024-03-26 | DRG: 850 | Disposition: A | Discharge status: Home / Self Care | Attending: Internal Medicine | Admitting: Internal Medicine

## 2024-03-21 DIAGNOSIS — Z882 Allergy status to sulfonamides status: Secondary | ICD-10-CM

## 2024-03-21 DIAGNOSIS — J189 Pneumonia, unspecified organism: Secondary | ICD-10-CM | POA: Diagnosis not present

## 2024-03-21 DIAGNOSIS — J9601 Acute respiratory failure with hypoxia: Principal | ICD-10-CM | POA: Diagnosis present

## 2024-03-21 DIAGNOSIS — Z886 Allergy status to analgesic agent status: Secondary | ICD-10-CM

## 2024-03-21 DIAGNOSIS — Z881 Allergy status to other antibiotic agents status: Secondary | ICD-10-CM

## 2024-03-21 DIAGNOSIS — R079 Chest pain, unspecified: Secondary | ICD-10-CM | POA: Diagnosis not present

## 2024-03-21 DIAGNOSIS — Z8049 Family history of malignant neoplasm of other genital organs: Secondary | ICD-10-CM

## 2024-03-21 DIAGNOSIS — Z87891 Personal history of nicotine dependence: Secondary | ICD-10-CM

## 2024-03-21 DIAGNOSIS — Z85828 Personal history of other malignant neoplasm of skin: Secondary | ICD-10-CM

## 2024-03-21 DIAGNOSIS — Z808 Family history of malignant neoplasm of other organs or systems: Secondary | ICD-10-CM

## 2024-03-21 DIAGNOSIS — Z86711 Personal history of pulmonary embolism: Secondary | ICD-10-CM

## 2024-03-21 DIAGNOSIS — Z888 Allergy status to other drugs, medicaments and biological substances status: Secondary | ICD-10-CM

## 2024-03-21 DIAGNOSIS — W19XXXD Unspecified fall, subsequent encounter: Secondary | ICD-10-CM | POA: Diagnosis present

## 2024-03-21 DIAGNOSIS — Z79899 Other long term (current) drug therapy: Secondary | ICD-10-CM

## 2024-03-21 DIAGNOSIS — R918 Other nonspecific abnormal finding of lung field: Secondary | ICD-10-CM | POA: Diagnosis present

## 2024-03-21 DIAGNOSIS — Z8616 Personal history of COVID-19: Secondary | ICD-10-CM

## 2024-03-21 DIAGNOSIS — J449 Chronic obstructive pulmonary disease, unspecified: Secondary | ICD-10-CM | POA: Diagnosis present

## 2024-03-21 DIAGNOSIS — I2699 Other pulmonary embolism without acute cor pulmonale: Secondary | ICD-10-CM | POA: Diagnosis present

## 2024-03-21 DIAGNOSIS — I471 Supraventricular tachycardia, unspecified: Secondary | ICD-10-CM | POA: Diagnosis present

## 2024-03-21 DIAGNOSIS — Z86008 Personal history of in-situ neoplasm of other site: Secondary | ICD-10-CM

## 2024-03-21 DIAGNOSIS — D72829 Elevated white blood cell count, unspecified: Secondary | ICD-10-CM | POA: Insufficient documentation

## 2024-03-21 DIAGNOSIS — Z880 Allergy status to penicillin: Secondary | ICD-10-CM

## 2024-03-21 DIAGNOSIS — I2510 Atherosclerotic heart disease of native coronary artery without angina pectoris: Secondary | ICD-10-CM | POA: Diagnosis present

## 2024-03-21 DIAGNOSIS — S62102D Fracture of unspecified carpal bone, left wrist, subsequent encounter for fracture with routine healing: Secondary | ICD-10-CM

## 2024-03-21 DIAGNOSIS — C9200 Acute myeloblastic leukemia, not having achieved remission: Secondary | ICD-10-CM | POA: Diagnosis not present

## 2024-03-21 DIAGNOSIS — G3184 Mild cognitive impairment, so stated: Secondary | ICD-10-CM | POA: Diagnosis present

## 2024-03-21 DIAGNOSIS — J440 Chronic obstructive pulmonary disease with acute lower respiratory infection: Secondary | ICD-10-CM | POA: Diagnosis present

## 2024-03-21 DIAGNOSIS — Z9049 Acquired absence of other specified parts of digestive tract: Secondary | ICD-10-CM

## 2024-03-21 DIAGNOSIS — Z86718 Personal history of other venous thrombosis and embolism: Secondary | ICD-10-CM

## 2024-03-21 DIAGNOSIS — K219 Gastro-esophageal reflux disease without esophagitis: Secondary | ICD-10-CM | POA: Diagnosis present

## 2024-03-21 LAB — RESPIRATORY PANEL BY PCR

## 2024-03-21 LAB — TROPONIN I (HIGH SENSITIVITY)
Troponin I (High Sensitivity): 11 ng/L (ref <18)
Troponin I (High Sensitivity): 11 ng/L (ref <18)

## 2024-03-21 LAB — URINALYSIS, W/ REFLEX TO CULTURE (INFECTION SUSPECTED)
Bacteria, UA: NONE SEEN
Bilirubin Urine: NEGATIVE
Glucose, UA: NEGATIVE mg/dL
Hgb urine dipstick: NEGATIVE
Ketones, ur: NEGATIVE mg/dL
Leukocytes,Ua: NEGATIVE
Nitrite: NEGATIVE
Protein, ur: 30 mg/dL — AB
Specific Gravity, Urine: 1.043 — ABNORMAL HIGH (ref 1.005–1.030)
pH: 5 (ref 5.0–8.0)

## 2024-03-21 LAB — CBC
HCT: 34.4 % — ABNORMAL LOW (ref 39.0–52.0)
Hemoglobin: 11.1 g/dL — ABNORMAL LOW (ref 13.0–17.0)
MCH: 31.7 pg (ref 26.0–34.0)
MCHC: 32.3 g/dL (ref 30.0–36.0)
MCV: 98.3 fL (ref 80.0–100.0)
Platelets: 147 10*3/uL — ABNORMAL LOW (ref 150–400)
RBC: 3.5 MIL/uL — ABNORMAL LOW (ref 4.22–5.81)
RDW: 16.1 % — ABNORMAL HIGH (ref 11.5–15.5)
WBC: 30 10*3/uL — ABNORMAL HIGH (ref 4.0–10.5)
nRBC: 0.2 % (ref 0.0–0.2)

## 2024-03-21 LAB — BASIC METABOLIC PANEL WITH GFR
Anion gap: 8 (ref 5–15)
BUN: 21 mg/dL (ref 8–23)
CO2: 25 mmol/L (ref 22–32)
Calcium: 8.8 mg/dL — ABNORMAL LOW (ref 8.9–10.3)
Chloride: 105 mmol/L (ref 98–111)
Creatinine, Ser: 1.1 mg/dL (ref 0.61–1.24)
GFR, Estimated: >60 mL/min (ref >60)
Glucose, Bld: 103 mg/dL — ABNORMAL HIGH (ref 70–99)
Potassium: 3.9 mmol/L (ref 3.5–5.1)
Sodium: 138 mmol/L (ref 135–145)

## 2024-03-21 LAB — PROTIME-INR
INR: 1.2 (ref 0.8–1.2)
Prothrombin Time: 15.4 s — ABNORMAL HIGH (ref 11.4–15.2)

## 2024-03-21 LAB — LACTIC ACID, PLASMA: Lactic Acid, Venous: 0.7 mmol/L (ref 0.5–1.9)

## 2024-03-21 LAB — STREP PNEUMONIAE URINARY ANTIGEN: Strep Pneumo Urinary Antigen: NEGATIVE

## 2024-03-21 MED ORDER — ACETAMINOPHEN 325 MG PO TABS
650.0000 mg | ORAL_TABLET | Freq: Four times a day (QID) | ORAL | Status: DC | PRN
  Administered 2024-03-21 – 2024-03-22 (×3): 650 mg via ORAL
  Filled 2024-03-21 (×3): qty 2

## 2024-03-21 MED ORDER — SODIUM CHLORIDE 0.9 % IV SOLN
2.0000 g | INTRAVENOUS | Status: AC
  Administered 2024-03-22 – 2024-03-26 (×5): 2 g via INTRAVENOUS
  Filled 2024-03-21 (×5): qty 20

## 2024-03-21 MED ORDER — SODIUM CHLORIDE 0.9 % IV SOLN
1.0000 g | Freq: Once | INTRAVENOUS | Status: AC
  Administered 2024-03-21: 1 g via INTRAVENOUS
  Filled 2024-03-21: qty 10

## 2024-03-21 MED ORDER — SODIUM CHLORIDE 0.9 % IV SOLN: INTRAVENOUS | Status: AC

## 2024-03-21 MED ORDER — SODIUM CHLORIDE 0.9 % IV SOLN
500.0000 mg | INTRAVENOUS | Status: AC
  Administered 2024-03-22 – 2024-03-26 (×5): 500 mg via INTRAVENOUS
  Filled 2024-03-21 (×5): qty 5

## 2024-03-21 MED ORDER — ONDANSETRON HCL 4 MG/2ML IJ SOLN: 4.0000 mg | Freq: Four times a day (QID) | INTRAMUSCULAR | Status: DC | PRN

## 2024-03-21 MED ORDER — IOHEXOL 350 MG/ML SOLN
75.0000 mL | Freq: Once | INTRAVENOUS | Status: AC | PRN
  Administered 2024-03-21: 75 mL via INTRAVENOUS

## 2024-03-21 MED ORDER — ONDANSETRON HCL 4 MG PO TABS
4.0000 mg | ORAL_TABLET | Freq: Four times a day (QID) | ORAL | Status: DC | PRN
Start: 2024-03-21 — End: 2024-03-26

## 2024-03-21 MED ORDER — LEVALBUTEROL HCL 0.63 MG/3ML IN NEBU: 0.6300 mg | INHALATION_SOLUTION | Freq: Four times a day (QID) | RESPIRATORY_TRACT | Status: DC | PRN

## 2024-03-21 MED ORDER — SODIUM CHLORIDE 0.9 % IV SOLN
500.0000 mg | Freq: Once | INTRAVENOUS | Status: AC
  Administered 2024-03-21: 500 mg via INTRAVENOUS
  Filled 2024-03-21: qty 5

## 2024-03-21 MED ORDER — ENOXAPARIN SODIUM 40 MG/0.4ML IJ SOSY
40.0000 mg | PREFILLED_SYRINGE | INTRAMUSCULAR | Status: DC
  Administered 2024-03-21 – 2024-03-25 (×5): 40 mg via SUBCUTANEOUS
  Filled 2024-03-21 (×5): qty 0.4

## 2024-03-21 NOTE — ED Triage Notes (Signed)
 Pt presenting to ED via EMS from home with chest pain. Pt reports woke up with 10/10 midsternal chest pain. +SOB -nausea +hx COPD  SpO2 upper 80s on EMS arrival, placed on 2L [place] with improvement in sats. Received 324mg  ASA en route

## 2024-03-21 NOTE — Plan of Care (Signed)

## 2024-03-21 NOTE — Assessment & Plan Note (Addendum)
 Pneumonia  Decompensated respiratory status requiring 2 L with noted pneumonia on imaging today Baseline COPD-does not appear to be in acute exacerbation IV Rocephin azithromycin  Blood and respiratory cultures Continue supplemental oxygen As needed DuoNebs Consider addition of IV steroids as appropriate Noted ? 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

## 2024-03-21 NOTE — ED Notes (Signed)
 Fall precautions in place for Pt. This RN placed fall band, fall grip socks, bed alarm and fall sign.

## 2024-03-21 NOTE — Assessment & Plan Note (Signed)
 Marked white count 30 on presentation in the setting of pneumonia Appears mildly dry Will monitor white count with treatment hydration Reassess as appropriate

## 2024-03-21 NOTE — ED Notes (Signed)
 Several attempts have been made to collect a urine sample, Pt is unable to go. Attempts also made by previous RN.

## 2024-03-21 NOTE — Assessment & Plan Note (Signed)
 Baseline COPD Does not appear to be in acute exacerbation at present in setting of overlapping pneumonia decompensated respiratory failure Continue as needed inhalers Consider oral steroids as appropriate Monitor

## 2024-03-21 NOTE — Assessment & Plan Note (Addendum)
 Baseline history of CAD followed by Dr. Jerelene Monday Chest pain now resolved Troponin negative x 2 EKG stable Status post aspirin  Monitor

## 2024-03-21 NOTE — ED Notes (Signed)
 Pt to CT

## 2024-03-21 NOTE — ED Notes (Signed)
 CCMD called to place pt on cardiac monitor per order

## 2024-03-21 NOTE — Assessment & Plan Note (Signed)
 Baseline history of SVT Rate controlled at present Continue home bisoprolol 

## 2024-03-21 NOTE — Assessment & Plan Note (Signed)
 Remote history of PE CTA negative for any pulmonary emboli today Monitor

## 2024-03-21 NOTE — H&P (Signed)
 History and Physical    Patient: Dylan Weber HYQ:[id number] DOB: 02-Apr-1933 DOA: 03/21/2024 DOS: the patient was seen and examined on 03/21/2024 PCP: Will Hare, NP  Patient coming from: Home  Chief Complaint:  Chief Complaint  Patient presents with   Chest Pain   HPI: Dylan Weber is a 89 y.o. male with medical history significant of COPD, previous chronic oxygen use of 2 L no longer using, history of PE, SVT presented with acute respiratory failure with hypoxia, pneumonia.  Limited history as patient overall poor historian.  Per report, patient with intermittent chest pain as well as shortness of breath and nausea from earlier this morning.  EMS subsequently called with noted O2 sats in the 80s.  Patient does report some cough as well as mild malaise.  Non-smoker.  No reported alcohol or illicit drug use.  No abdominal pain or diarrhea.  Shortness of breath persisted though it is now improved.  Chest pain now resolved.  Denies any orthopnea or PND. Presented to the ER Tmax 99, heart rate 50s to 70s.  BP stable.  Requiring 2 L nasal cannula to keep O2 sats greater than 94%.  White count 30, hemoglobin 11, platelets 147, urinalysis not indicative of infection.  Troponin negative x 2.  EKG normal sinus rhythm.  Creatinine 1.1.  CTA negative for PE.  Positive right upper lobe pneumonia.  Noted right hilar lymphadenopathy with concern for possible bronchogenic carcinoma. Review of Systems: As mentioned in the history of present illness. All other systems reviewed and are negative. Past Medical History:  Diagnosis Date   Basal cell carcinoma 06/19/2022   Dorsum nose. Nodular. EDC 07/17/2022   BCC (basal cell carcinoma) 10/11/2022   right nasal supratip, superficial and nodular, EDC 12/19/22   COPD (chronic obstructive pulmonary disease) (HCC)    Coronary artery calcification seen on CT scan 11/17/2021   COVID-19 10/2021   Diastolic dysfunction    a. 11/2021 Echo: EF 55-60%, no rwma, GrI DD,  nl RV fxn. Mild AI.   GERD (gastroesophageal reflux disease)    Pulmonary embolism (HCC)    a. 11/2021 CTA Chest: small peripheral PE in RLL branch of PA-->Eliquis .   Right leg DVT (HCC)    a. 11/2021 LE U/S: occlusive R calf vein DVT in R peroneal vein-->eliquis .   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 [facility] 07/17/2022   SVT (supraventricular tachycardia) (HCC)    a. Dx 10/2021 in setting of COVID infxn.   Past Surgical History:  Procedure Laterality Date   APPENDECTOMY     CHOLECYSTECTOMY     EYE SURGERY     Social History:  reports that he has quit smoking. His smoking use included cigarettes. He has quit using smokeless tobacco. He reports current alcohol use of about 7.0 standard drinks of alcohol per week. He reports that he does not use drugs.  Allergies  Allergen Reactions   Diltiazem  Other (See Comments)    Flushing/redness to body   Prednisone  Other (See Comments)    jittery   Amoxicillin Rash   Sulfamethoxazole-Trimethoprim Rash    Family History  Problem Relation Age of Onset   Uterine cancer Mother    Brain cancer Sister     Prior to Admission medications   Medication Sig Start Date End Date Taking? Authorizing Provider  acetaminophen  (TYLENOL ) 650 MG CR tablet Take 650 mg by mouth  as needed for pain.    [provider]  albuterol  (VENTOLIN  HFA) 108 (90 Base) MCG/ACT inhaler Inhale 2 puffs into the lungs every 6 (six) hours as needed for wheezing or shortness of breath. 11/01/21   Verla Glaze, MD  Apoaequorin (PREVAGEN PO) Take by mouth daily.    [provider]  bisoprolol  (ZEBETA ) 5 MG tablet TAKE 1 TABLET (5 MG TOTAL) BY MOUTH IN THE MORNING AND AT BEDTIME 03/04/24   Gollan, Timothy J, MD  ciclopirox  (LOPROX ) 0.77 % SUSP Apply to toenails QD-BID. 10/11/22   Moye, Virginia , MD  COMBIVENT  RESPIMAT 20-100 MCG/ACT AERS respimat  Inhale 1 puff into the lungs every 6 (six) hours as needed.    [provider]  doxepin (SINEQUAN) 10 MG capsule Take 10 mg by mouth at bedtime. Patient not taking: Reported on 02/09/2022 12/25/21 12/25/22  [provider]  gabapentin (NEURONTIN) 100 MG capsule Take 100 mg by mouth at bedtime. 05/02/23 07/16/23  [provider]  Multiple Vitamin (MULTIVITAMIN) capsule Take 1 capsule by mouth daily.    [provider]  mupirocin  ointment (BACTROBAN ) 2 % Apply 1 Application topically daily. 02/20/23   Moye, Virginia , MD  pantoprazole  (PROTONIX ) 20 MG tablet Take 20 mg by mouth daily.    [provider]  Probiotic Product (PROBIOTIC ADVANCED PO) Take by mouth daily.    [provider]  torsemide  (DEMADEX ) 10 MG tablet Take 5 tablets (50 mg total) by mouth every other day. May take 50 mg on off days as needed for swelling. 05/08/23   Devorah Fonder, MD    Physical Exam: Vitals:   03/21/24 0630 03/21/24 0645 03/21/24 0700 03/21/24 0718  BP: 109/79  (!) 102/59   Pulse: 61  (!) 58   Resp: 20  18   Temp:    98.1 F (36.7 C)  TempSrc:    Oral  SpO2:  96% 91%   Weight:      Height:       Physical Exam Constitutional:      Appearance: He is normal weight.  HENT:     Head: Normocephalic and atraumatic.     Nose: Nose normal.     Mouth/Throat:     Mouth: Mucous membranes are moist.  Eyes:     Pupils: Pupils are equal, round, and reactive to light.  Cardiovascular:     Rate and Rhythm: Normal rate and regular rhythm.  Pulmonary:     Effort: Pulmonary effort is normal.  Abdominal:     General: Bowel sounds are normal.  Musculoskeletal:        General: Normal range of motion.  Skin:    General: Skin is warm.  Psychiatric:        Mood and Affect: Mood normal.     Data Reviewed:  There are no new results to review at this time.  CT Angio Chest PE W/Cm &/Or Wo Cm CLINICAL DATA:  Evaluate for acute pulmonary embolism.  EXAM: CT  ANGIOGRAPHY CHEST WITH CONTRAST  TECHNIQUE: Multidetector CT imaging of the chest was performed using the standard protocol during bolus administration of intravenous contrast. Multiplanar CT image reconstructions and MIPs were obtained to evaluate the vascular anatomy.  RADIATION DOSE REDUCTION: This exam was performed according to the departmental dose-optimization program which includes automated exposure control, adjustment of the mA and/or kV according to patient size and/or use of iterative reconstruction technique.  CONTRAST:  75mL OMNIPAQUE  IOHEXOL  350 MG/ML SOLN  COMPARISON:  06/11/2023  FINDINGS: Cardiovascular: Satisfactory opacification of the pulmonary arteries to the segmental level. No evidence of pulmonary embolism. Normal heart size. No pericardial effusion. Aortic atherosclerosis. The ascending thoracic aorta measures 4 cm, image 43/7. Coronary artery calcifications.  Mediastinum/Nodes: Thyroid  gland, trachea, and esophagus appear normal. No mediastinal adenopathy. Right hilar node measures 1.5 cm, image 61/4.  Lungs/Pleura: Emphysema with diffuse bronchial wall thickening. Within the anterior right upper lobe there is a patchy area of ground-glass and airspace density, image 54/5. Right upper lobe lung nodule measures 5 mm, image 38/5. Unchanged from the previous exam. This is been stable since 12/18/2022 compatible with a benign nodule. Peripheral nodule within the right upper lobe measuring 2 mm is also unchanged from 12/28/2022. 4 mm nodule in the right lower lobe appears new, image 78/5.  Upper Abdomen: No acute abnormality.  Cholecystectomy.  Musculoskeletal: Thoracic degenerative disc disease. No acute or suspicious osseous findings.  Review of the MIP images confirms the above findings.  IMPRESSION: 1. No evidence for acute pulmonary embolism. 2. Patchy area of ground-glass and airspace density within the anterior right upper lobe is favored to  represent an area of pneumonia. Recommend follow-up imaging to ensure resolution and to rule out underlying malignancy. 3. Right hilar lymphadenopathy is favored to be reactive. 4. New 4 mm nodule in the right lower lobe. If the patient is at high risk for bronchogenic carcinoma, follow-up chest CT at 1year is recommended. If the patient is at low risk, no follow-up is needed. This recommendation follows the consensus statement: Guidelines for Management of Small Pulmonary Nodules Detected on CT Scans: A Statement from the Fleischner Society as published in Radiology 2005; 237:395-400. 5. Coronary artery calcifications. 6. 4 cm ascending thoracic aorta. Recommend annual imaging followup by CTA or MRA. This recommendation follows 2010 ACCF/AHA/AATS/ACR/ASA/SCA/SCAI/SIR/STS/SVM Guidelines for the Diagnosis and Management of Patients with Thoracic Aortic Disease. Circulation. 2010; 121: Z610-R604. Aortic aneurysm NOS (ICD10-I71.9) 7. Aortic Atherosclerosis (ICD10-I70.0) and Emphysema (ICD10-J43.9).  Electronically Signed   By: Kimberley Penman M.D.   On: 03/21/2024 06:44 DG Chest Portable 1 View CLINICAL DATA:  Shortness of breath and hypoxia.  Chest pain.  EXAM: PORTABLE CHEST 1 VIEW  COMPARISON:  06/11/2023.  FINDINGS: The heart size and mediastinal contours are within normal limits. Faint right upper lobe opacity is noted concerning for early pneumonia. Left lung appears clear. The visualized osseous structures are intact. The visualized skeletal structures are unremarkable.  IMPRESSION: Faint right upper lobe opacity concerning for early pneumonia.  Electronically Signed   By: Kimberley Penman M.D.   On: 03/21/2024 06:34  Lab Results  Component Value Date   WBC 30.0 (H) 03/21/2024   HGB 11.1 (L) 03/21/2024   HCT 34.4 (L) 03/21/2024   MCV 98.3 03/21/2024   PLT 147 (L) 03/21/2024   Last metabolic panel Lab Results  Component Value Date   GLUCOSE 103 (H) 03/21/2024    NA 138 03/21/2024   K 3.9 03/21/2024   CL 105 03/21/2024   CO2 25 03/21/2024   BUN 21 03/21/2024   CREATININE 1.10 03/21/2024   GFRNONAA >60 03/21/2024   CALCIUM 8.8 (L) 03/21/2024   PROT 7.8 12/31/2021   ALBUMIN 4.1 12/31/2021   BILITOT 1.0 12/31/2021   ALKPHOS 64 12/31/2021   AST 24 12/31/2021   ALT 21 12/31/2021   ANIONGAP 8 03/21/2024    Assessment and Plan: * Acute respiratory failure with hypoxia (HCC) Pneumonia  Decompensated respiratory status requiring 2 L with noted pneumonia on imaging today Baseline  COPD-does not appear to be in acute exacerbation IV Rocephin azithromycin  Blood and respiratory cultures Continue supplemental oxygen As needed DuoNebs Consider addition of IV steroids as appropriate Noted ? Bronchogenic carcinoma on imaging- f/u outpt  Monitor  Leukocytosis Marked white count 30 on presentation in the setting of pneumonia Appears mildly dry Will monitor white count with treatment hydration Reassess as appropriate  CAD (coronary artery disease) Baseline history of CAD followed by Dr. Jerelene Monday Chest pain now resolved Troponin negative x 2 EKG stable Status post aspirin  Monitor  Pulmonary embolism (HCC) Remote history of PE CTA negative for any pulmonary emboli today Monitor  COPD (chronic obstructive pulmonary disease) (HCC) Baseline COPD Does not appear to be in acute exacerbation at present in setting of overlapping pneumonia decompensated respiratory failure Continue as needed inhalers Consider oral steroids as appropriate Monitor   SVT (supraventricular tachycardia) (HCC) Baseline history of SVT Rate controlled at present Continue home bisoprolol       Advance Care Planning:   Code Status: Full Code   Consults: None at present   Family Communication: No family at the bedside   Severity of Illness: The appropriate patient status for this patient is OBSERVATION. Observation status is judged to be reasonable and necessary in  order to provide the required intensity of service to ensure the patient's safety. The patient's presenting symptoms, physical exam findings, and initial radiographic and laboratory data in the context of their medical condition is felt to place them at decreased risk for further clinical deterioration. Furthermore, it is anticipated that the patient will be medically stable for discharge from the hospital within 2 midnights of admission.   Author: Corrinne Din, MD 03/21/2024 8:04 AM  For on call review [email or web address].

## 2024-03-21 NOTE — ED Provider Notes (Addendum)
 [facility] Provider Note    Event Date/Time   First MD Initiated Contact with Patient 03/21/24 0340     (approximate)   History   Chest Pain   HPI Dylan Weber is a 89 y.o. male whose medical history includes but is not limited to prior episodes of acute on chronic respiratory failure with hypoxia, CAD, COPD with no chronic oxygen requirement, and pulmonary embolism.  He presents by EMS for evaluation of acute onset chest pain.  He states that he woke up and it first did not have any pain but then all of a sudden he developed mid sternal chest heaviness that was quite severe and painful.  He was short of breath as well.  He was found to be in the upper 80s SPO2 by EMS and placed on 2 L of oxygen.  He said that his pain was completely gone before EMS arrived.  He received a full dose aspirin  en route to the hospital.  He said he feels fine now though he is still on supplemental oxygen.  He has not been feeling short of breath recently and does not feel like he is having a COPD exacerbation.  He has had a couple falls recently that were mechanical in nature and has a fracture to his left forearm for which he is wearing a cast.     Physical Exam   Triage Vital Signs: ED Triage Vitals  Encounter Vitals Group     BP 03/21/24 0342 122/72     Systolic BP Percentile --      Diastolic BP Percentile --      Pulse Rate 03/21/24 0342 72     Resp 03/21/24 0342 (!) 25     Temp 03/21/24 0342 99 F (37.2 C)     Temp Source 03/21/24 0342 Oral     SpO2 03/21/24 0341 (!) 87 %     Weight 03/21/24 0345 68 kg (150 lb)     Height 03/21/24 0345 1.753 m (5\' 9" )     Head Circumference --      Peak Flow --      Pain Score 03/21/24 0344 10     Pain Loc --      Pain Education --      Exclude from Growth Chart --     Most recent vital signs: Vitals:   03/21/24 0630 03/21/24 0645  BP: 109/79   Pulse: 61   Resp: 20   Temp:    SpO2:  96%    General: Awake, no distress.   CV:  Good peripheral perfusion.  Regular rate and rhythm, normal heart sounds. Resp:  Normal effort. Speaking easily and comfortably, no accessory muscle usage nor intercostal retractions.  Lung sounds are diminished in all lung fields but no wheezing is appreciated.  Patient is speaking in full sentences.  Currently wearing 2 L of oxygen nasal cannula. Abd:  No distention.  No tenderness to palpation of the abdomen.   ED Results / Procedures / Treatments   Labs (all labs ordered are listed, but only abnormal results are displayed) Labs Reviewed  BASIC METABOLIC PANEL WITH GFR - Abnormal; Notable for the following components:      Result Value   Glucose, Bld 103 (*)    Calcium 8.8 (*)    All other components within normal limits  CBC - Abnormal; Notable for the following components:   WBC 30.0 (*)    RBC 3.50 (*)    Hemoglobin  11.1 (*)    HCT 34.4 (*)    RDW 16.1 (*)    Platelets 147 (*)    All other components within normal limits  PROTIME-INR - Abnormal; Notable for the following components:   Prothrombin Time 15.4 (*)    All other components within normal limits  URINALYSIS, W/ REFLEX TO CULTURE (INFECTION SUSPECTED) - Abnormal; Notable for the following components:   Color, Urine YELLOW (*)    APPearance CLEAR (*)    Specific Gravity, Urine 1.043 (*)    Protein, ur 30 (*)    All other components within normal limits  CULTURE, BLOOD (ROUTINE X 2)  CULTURE, BLOOD (ROUTINE X 2)  LACTIC ACID, PLASMA  TROPONIN I (HIGH SENSITIVITY)  TROPONIN I (HIGH SENSITIVITY)     EKG  ED ECG REPORT I, Lynnda Sas, the attending physician, personally viewed and interpreted this ECG.  Date: 03/21/2024 EKG Time: 3:43 AM Rate: 72 Rhythm: normal sinus rhythm QRS Axis: normal Intervals: normal ST/T Wave abnormalities: Non-specific ST segment / T-wave changes, but no clear evidence of acute ischemia. Narrative Interpretation: no definitive evidence of acute ischemia; does not meet  STEMI criteria.    RADIOLOGY See ED course for details regarding imaging studies   PROCEDURES:  Critical Care performed: Yes, see critical care procedure note(s)  .1-3 Lead EKG Interpretation  Performed by: Lynnda Sas, MD Authorized by: Lynnda Sas, MD     Interpretation: normal     ECG rate:  72   ECG rate assessment: normal     Rhythm: sinus rhythm     Ectopy: none     Conduction: normal   .Critical Care  Performed by: Lynnda Sas, MD Authorized by: Lynnda Sas, MD   Critical care provider statement:    Critical care time (minutes):  30   Critical care time was exclusive of:  Separately billable procedures and treating other patients   Critical care was necessary to treat or prevent imminent or life-threatening deterioration of the following conditions:  Respiratory failure   Critical care was time spent personally by me on the following activities:  Development of treatment plan with patient or surrogate, evaluation of patient's response to treatment, examination of patient, obtaining history from patient or surrogate, ordering and performing treatments and interventions, ordering and review of laboratory studies, ordering and review of radiographic studies, pulse oximetry, re-evaluation of patient's condition and review of old charts     IMPRESSION / MDM / ASSESSMENT AND PLAN / ED COURSE  I reviewed the triage vital signs and the nursing notes.                              Differential diagnosis includes, but is not limited to, COPD exacerbation, pneumonia, PE, ACS, AAS, pneumothorax.  Patient's presentation is most consistent with acute presentation with potential threat to life or bodily function.  Labs/studies ordered: BMP, CBC, high-sensitivity troponin, lactic acid, blood cultures x 2, pro time-INR, urinalysis  Interventions/Medications given:  Medications  cefTRIAXone (ROCEPHIN) 1 g in sodium chloride  0.9 % 100 mL IVPB (1 g Intravenous New Bag/Given  03/21/24 0714)  azithromycin  (ZITHROMAX ) 500 mg in sodium chloride  0.9 % 250 mL IVPB (has no administration in time range)  iohexol  (OMNIPAQUE ) 350 MG/ML injection 75 mL (75 mLs Intravenous Contrast Given 03/21/24 0424)    (Note:  hospital course my include additional interventions and/or labs/studies not listed above.)   Patient had acute onset and severe shortness  of breath which was associated with severe chest heaviness.  He was hypoxic even though he has not been having any respiratory issues.  He does not remember having had blood clots in his lungs but he did say that he was previously on blood thinners, and he has a document history of PE.  The patient's metabolic panel is normal.  However his CBC is notable for leukocytosis of 30 which is surprising and inconsistent with his prior results.  He is completely asymptomatic now at this time.  I will initiate a "possible sepsis" workup with lactic acid and blood cultures.  I am awaiting chest x-ray, but given his history, I will also proceed with a CTA chest to rule out pulmonary embolism as a cause of his acute onset chest pain, shortness of breath, and hypoxia.  The patient is on the cardiac monitor to evaluate for evidence of arrhythmia and/or significant heart rate changes.   Clinical Course as of 03/21/24 0718  Sat Mar 21, 2024  0505 Lactic Acid, Venous: 0.7 [CF]  0649 CT Angio Chest PE W/Cm &/Or Wo Cm I independently viewed and interpreted the patient's CTA chest.  I do not see any obvious PE.  He does have some lobar changes suggestive of pneumonia, confirmed by the radiology report.  I also has some nodules.  I will reassess the patient. [CF]  [phone] Of note, despite the patient's hypoxia and white blood cell count of 30, he does not technically meet SIRS/sepsis criteria given no tachycardia, fever, nor tachypnea.  I will treat him empirically for community-acquired pneumonia and I will admit him for his acute respiratory failure with  hypoxia. [CF]  0712 Consulted the hospitalist service for admission.  I reassessed the patient.  He says he feels "much better".  however he still has an oxygen requirement.  His wife is at bedside and confirmed their preference to stay in the hospital which is necessary and appropriate. [CF]  2725 I consulted Dr. Daisey Dryer with the hospitalist service.  We discussed the case and he will admit the patient. [CF]    Clinical Course User Index [CF] Lynnda Sas, MD     FINAL CLINICAL IMPRESSION(S) / ED DIAGNOSES   Final diagnoses:  Acute respiratory failure with hypoxia [facility])  Community acquired pneumonia, unspecified laterality     Rx / DC Orders   ED Discharge Orders     None        Note:  This document was prepared using Dragon voice recognition software and may include unintentional dictation errors.   Lynnda Sas, MD 03/21/24 Dylan Weber    Lynnda Sas, MD 03/21/24 [phone]

## 2024-03-22 DIAGNOSIS — I25118 Atherosclerotic heart disease of native coronary artery with other forms of angina pectoris: Secondary | ICD-10-CM | POA: Diagnosis not present

## 2024-03-22 DIAGNOSIS — G3184 Mild cognitive impairment, so stated: Secondary | ICD-10-CM | POA: Diagnosis present

## 2024-03-22 DIAGNOSIS — Z86711 Personal history of pulmonary embolism: Secondary | ICD-10-CM | POA: Diagnosis not present

## 2024-03-22 DIAGNOSIS — Z881 Allergy status to other antibiotic agents status: Secondary | ICD-10-CM | POA: Diagnosis not present

## 2024-03-22 DIAGNOSIS — Z882 Allergy status to sulfonamides status: Secondary | ICD-10-CM | POA: Diagnosis not present

## 2024-03-22 DIAGNOSIS — Z85828 Personal history of other malignant neoplasm of skin: Secondary | ICD-10-CM | POA: Diagnosis not present

## 2024-03-22 DIAGNOSIS — J189 Pneumonia, unspecified organism: Secondary | ICD-10-CM | POA: Diagnosis present

## 2024-03-22 DIAGNOSIS — R079 Chest pain, unspecified: Secondary | ICD-10-CM | POA: Diagnosis present

## 2024-03-22 DIAGNOSIS — D72829 Elevated white blood cell count, unspecified: Secondary | ICD-10-CM | POA: Diagnosis not present

## 2024-03-22 DIAGNOSIS — J440 Chronic obstructive pulmonary disease with acute lower respiratory infection: Secondary | ICD-10-CM | POA: Diagnosis present

## 2024-03-22 DIAGNOSIS — Z808 Family history of malignant neoplasm of other organs or systems: Secondary | ICD-10-CM | POA: Diagnosis not present

## 2024-03-22 DIAGNOSIS — C9200 Acute myeloblastic leukemia, not having achieved remission: Secondary | ICD-10-CM | POA: Diagnosis present

## 2024-03-22 DIAGNOSIS — W19XXXD Unspecified fall, subsequent encounter: Secondary | ICD-10-CM | POA: Diagnosis present

## 2024-03-22 DIAGNOSIS — Z86008 Personal history of in-situ neoplasm of other site: Secondary | ICD-10-CM | POA: Diagnosis not present

## 2024-03-22 DIAGNOSIS — I2510 Atherosclerotic heart disease of native coronary artery without angina pectoris: Secondary | ICD-10-CM | POA: Diagnosis present

## 2024-03-22 DIAGNOSIS — K219 Gastro-esophageal reflux disease without esophagitis: Secondary | ICD-10-CM | POA: Diagnosis present

## 2024-03-22 DIAGNOSIS — Z86718 Personal history of other venous thrombosis and embolism: Secondary | ICD-10-CM | POA: Diagnosis not present

## 2024-03-22 DIAGNOSIS — Z87891 Personal history of nicotine dependence: Secondary | ICD-10-CM | POA: Diagnosis not present

## 2024-03-22 DIAGNOSIS — Z79899 Other long term (current) drug therapy: Secondary | ICD-10-CM | POA: Diagnosis not present

## 2024-03-22 DIAGNOSIS — Z8049 Family history of malignant neoplasm of other genital organs: Secondary | ICD-10-CM | POA: Diagnosis not present

## 2024-03-22 DIAGNOSIS — R918 Other nonspecific abnormal finding of lung field: Secondary | ICD-10-CM | POA: Diagnosis present

## 2024-03-22 DIAGNOSIS — I471 Supraventricular tachycardia, unspecified: Secondary | ICD-10-CM | POA: Diagnosis present

## 2024-03-22 DIAGNOSIS — Z9049 Acquired absence of other specified parts of digestive tract: Secondary | ICD-10-CM | POA: Diagnosis not present

## 2024-03-22 DIAGNOSIS — Z888 Allergy status to other drugs, medicaments and biological substances status: Secondary | ICD-10-CM | POA: Diagnosis not present

## 2024-03-22 DIAGNOSIS — J9601 Acute respiratory failure with hypoxia: Secondary | ICD-10-CM | POA: Diagnosis present

## 2024-03-22 DIAGNOSIS — J449 Chronic obstructive pulmonary disease, unspecified: Secondary | ICD-10-CM | POA: Diagnosis not present

## 2024-03-22 DIAGNOSIS — Z8616 Personal history of COVID-19: Secondary | ICD-10-CM | POA: Diagnosis not present

## 2024-03-22 DIAGNOSIS — S62102D Fracture of unspecified carpal bone, left wrist, subsequent encounter for fracture with routine healing: Secondary | ICD-10-CM | POA: Diagnosis not present

## 2024-03-22 DIAGNOSIS — Z880 Allergy status to penicillin: Secondary | ICD-10-CM | POA: Diagnosis not present

## 2024-03-22 LAB — COMPREHENSIVE METABOLIC PANEL WITH GFR
ALT: 30 U/L (ref 0–44)
AST: 32 U/L (ref 15–41)
Albumin: 3.3 g/dL — ABNORMAL LOW (ref 3.5–5.0)
Alkaline Phosphatase: 58 U/L (ref 38–126)
Anion gap: 10 (ref 5–15)
BUN: 15 mg/dL (ref 8–23)
CO2: 23 mmol/L (ref 22–32)
Calcium: 8.5 mg/dL — ABNORMAL LOW (ref 8.9–10.3)
Chloride: 105 mmol/L (ref 98–111)
Creatinine, Ser: 0.82 mg/dL (ref 0.61–1.24)
GFR, Estimated: >60 mL/min (ref >60)
Glucose, Bld: 105 mg/dL — ABNORMAL HIGH (ref 70–99)
Potassium: 3.7 mmol/L (ref 3.5–5.1)
Sodium: 138 mmol/L (ref 135–145)
Total Bilirubin: 0.7 mg/dL (ref 0.0–1.2)
Total Protein: 7.5 g/dL (ref 6.5–8.1)

## 2024-03-22 LAB — CBC
HCT: 33.1 % — ABNORMAL LOW (ref 39.0–52.0)
Hemoglobin: 10.8 g/dL — ABNORMAL LOW (ref 13.0–17.0)
MCH: 31.6 pg (ref 26.0–34.0)
MCHC: 32.6 g/dL (ref 30.0–36.0)
MCV: 96.8 fL (ref 80.0–100.0)
Platelets: 170 10*3/uL (ref 150–400)
RBC: 3.42 MIL/uL — ABNORMAL LOW (ref 4.22–5.81)
RDW: 15.9 % — ABNORMAL HIGH (ref 11.5–15.5)
WBC: 32.7 10*3/uL — ABNORMAL HIGH (ref 4.0–10.5)
nRBC: 0.2 % (ref 0.0–0.2)

## 2024-03-22 MED ORDER — MELATONIN 5 MG PO TABS
5.0000 mg | ORAL_TABLET | Freq: Every evening | ORAL | Status: DC | PRN
  Administered 2024-03-22 – 2024-03-25 (×3): 5 mg via ORAL
  Filled 2024-03-22 (×3): qty 1

## 2024-03-22 MED ORDER — ACETAMINOPHEN-CODEINE 300-30 MG PO TABS
1.0000 | ORAL_TABLET | Freq: Three times a day (TID) | ORAL | Status: DC | PRN
  Administered 2024-03-25: 1 via ORAL
  Filled 2024-03-22: qty 1

## 2024-03-22 MED ORDER — ENSURE ENLIVE PO LIQD
237.0000 mL | Freq: Two times a day (BID) | ORAL | Status: DC
Start: 2024-03-22 — End: 2024-03-26
  Administered 2024-03-22 – 2024-03-26 (×5): 237 mL via ORAL

## 2024-03-22 MED ORDER — BISOPROLOL FUMARATE 5 MG PO TABS
2.5000 mg | ORAL_TABLET | Freq: Every day | ORAL | Status: DC
  Administered 2024-03-22 – 2024-03-26 (×4): 2.5 mg via ORAL
  Filled 2024-03-22 (×5): qty 0.5

## 2024-03-22 MED ORDER — PANTOPRAZOLE SODIUM 20 MG PO TBEC
20.0000 mg | DELAYED_RELEASE_TABLET | Freq: Every day | ORAL | Status: DC
  Administered 2024-03-22 – 2024-03-26 (×4): 20 mg via ORAL
  Filled 2024-03-22 (×5): qty 1

## 2024-03-22 NOTE — Progress Notes (Signed)
   Patient Saturations on Room Air at Rest = 88%  Patient Saturations on [employer] while Ambulating = 80%  Patient Saturations on 2 Liters of oxygen while Ambulating = 94%

## 2024-03-22 NOTE — Plan of Care (Signed)
  Problem: Education: Goal: Knowledge of General Education information will improve Description: Including pain rating scale, medication(s)/side effects and non-pharmacologic comfort measures Outcome: Progressing   Problem: Health Behavior/Discharge Planning: Goal: Ability to manage health-related needs will improve Outcome: Progressing   Problem: Clinical Measurements: Goal: Ability to maintain clinical measurements within normal limits will improve Outcome: Progressing Goal: Will remain free from infection Outcome: Progressing Goal: Diagnostic test results will improve Outcome: Progressing Goal: Respiratory complications will improve Outcome: Progressing Goal: Cardiovascular complication will be avoided Outcome: Progressing   Problem: Nutrition: Goal: Adequate nutrition will be maintained Outcome: Progressing   Problem: Elimination: Goal: Will not experience complications related to bowel motility Outcome: Progressing Goal: Will not experience complications related to urinary retention Outcome: Progressing   Problem: Pain Managment: Goal: General experience of comfort will improve and/or be controlled Outcome: Progressing   Problem: Activity: Goal: Ability to tolerate increased activity will improve Outcome: Progressing   Problem: Clinical Measurements: Goal: Ability to maintain a body temperature in the normal range will improve Outcome: Progressing

## 2024-03-22 NOTE — Plan of Care (Signed)

## 2024-03-22 NOTE — Consult Note (Signed)
 ORTHOPEDIC CONSULTATION  Dylan Weber [id number]  03/22/2024  CC:  Chief Complaint  Patient presents with   Chest Pain    History of Present IlIness: The patient is a 89 y.o. male admitted yesterday for shortness of breath and pneumonia.  Orthopedics was consulted today because the patient was threatening to leave AMA to have his left forearm cast removed.  Further questioning of the patient and his family member who is present reveals that he had a ground-level fall a few days ago and was evaluated at the [place] [facility] with radiographs of his left wrist.  There seems to be some confusion whether he did or not have a documented fracture or whether he was told he had a fracture that did not need surgery.  Nonoperative treatment was recommended with a removable splint.  The patient did not like the splint so on Friday he went to [facility] across the street for a second opinion.  They apparently told the patient that they also recommended nonoperative treatment for his left wrist injury and if he was unhappy with the splint they could protect the area with a cast and see him back in 4 weeks and remove the cast.  PMH:  Past Medical History:  Diagnosis Date   Basal cell carcinoma 06/19/2022   Dorsum nose. Nodular. EDC 07/17/2022   BCC (basal cell carcinoma) 10/11/2022   right nasal supratip, superficial and nodular, EDC 12/19/22   COPD (chronic obstructive pulmonary disease) (HCC)    Coronary artery calcification seen on CT scan 11/17/2021   COVID-19 10/2021   Diastolic dysfunction    a. 11/2021 Echo: EF 55-60%, no rwma, GrI DD, nl RV fxn. Mild AI.   GERD (gastroesophageal reflux disease)    Pulmonary embolism (HCC)    a. 11/2021 CTA Chest: small peripheral PE in RLL branch of PA-->Eliquis .   Right leg DVT (HCC)    a. 11/2021 LE U/S: occlusive R calf vein DVT in R peroneal vein-->eliquis .   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 [facility] 07/17/2022   SVT (supraventricular tachycardia) (HCC)    a. Dx 10/2021 in setting of COVID infxn.    SH:  Past Surgical History:  Procedure Laterality Date   APPENDECTOMY     CHOLECYSTECTOMY     EYE SURGERY      ALL:  Allergies  Allergen Reactions   Pregabalin Anaphylaxis   Diltiazem  Other (See Comments)    Flushing/redness to body   Prednisone  Other (See Comments)    jittery   Meloxicam Nausea Only   Amoxicillin Rash   Sulfamethoxazole-Trimethoprim Rash    MED:  Medications Prior to Admission  Medication Sig Dispense Refill Last Dose/Taking   acetaminophen  (TYLENOL ) 650 MG CR tablet Take 650 mg by mouth as needed for pain.   Taking As Needed   acetaminophen -codeine (TYLENOL  #3) 300-30 MG tablet Take 1 tablet by mouth every 8 (eight) hours as needed for moderate pain (pain score 4-6).   03/20/2024 at 11:00 PM   albuterol  (VENTOLIN  HFA) 108 (90 Base) MCG/ACT inhaler Inhale 2 puffs into the lungs every 6 (six) hours as needed for wheezing or shortness of breath. 8 g 0 Taking As Needed   bisoprolol  (ZEBETA ) 5 MG tablet TAKE 1 TABLET (5 MG TOTAL) BY MOUTH IN THE MORNING AND AT BEDTIME 180 tablet 3 03/20/2024 Morning   COMBIVENT  RESPIMAT 20-100 MCG/ACT AERS respimat  Inhale 1 puff into the lungs every 6 (six) hours as needed.   03/20/2024 Morning   Multiple Vitamin (MULTIVITAMIN) capsule Take 1 capsule by mouth daily.   03/20/2024   pantoprazole  (PROTONIX ) 20 MG tablet Take 20 mg by mouth daily.   03/20/2024 Morning   Apoaequorin (PREVAGEN PO) Take by mouth daily. (Patient not taking: Reported on 03/21/2024)   Not Taking   ciclopirox  (LOPROX ) 0.77 % SUSP Apply to toenails QD-BID. 30 mL 11    doxepin (SINEQUAN) 10 MG capsule Take 10 mg by mouth at bedtime. (Patient not taking: Reported on 02/09/2022)   Not Taking   gabapentin (NEURONTIN) 100 MG capsule Take 100 mg by mouth at bedtime. (Patient not taking: Reported on  03/21/2024)   Not Taking   mupirocin  ointment (BACTROBAN ) 2 % Apply 1 Application topically daily. 22 g 0    Probiotic Product (PROBIOTIC ADVANCED PO) Take by mouth daily.      torsemide  (DEMADEX ) 10 MG tablet Take 5 tablets (50 mg total) by mouth every other day. May take 50 mg on off days as needed for swelling. (Patient not taking: Reported on 03/21/2024) 100 tablet 6 Not Taking   traMADol (ULTRAM) 50 MG tablet Take 50 mg by mouth 2 (two) times daily. For up to 5 days (Patient not taking: Reported on 03/21/2024)   Not Taking    All home medications have been reviewed as documented in the medication reconciliation portion of the patient record.  FH:  Family History  Problem Relation Age of Onset   Uterine cancer Mother    Brain cancer Sister     Social:  reports that he has quit smoking. His smoking use included cigarettes. He has quit using smokeless tobacco. He reports current alcohol use of about 7.0 standard drinks of alcohol per week. He reports that he does not use drugs.  Review of Systems: General: Denies fever, chills, weight loss Eyes: Denies blurry vision, changes in vision ENT: Denies sore throat, congestions, nosebleeds CV: Denies chest pain, palpitations Respiratory: Denies shortness of breath, wheezing, cough Gl: Denies abdominal pain, nausea, vomiting GU: Denies hematuria Integumentary: Denies rashes or lesions Neuro: Denies headache, dizziness Psych: Negative Hem/Onc: Denies easy bruising or bleeding disorders Musculoskeletal: See HPI above.  Vitals: BP 116/78 (BP Location: Right Arm)   Pulse 70   Temp (!) 97.5 F (36.4 C)   Resp 15   Ht 5\' 9"  (1.753 m)   Wt 68 kg   SpO2 98%   BMI 22.15 kg/m    Physical Exam: General: Awake, alert and oriented, no acute distress. Eyes: Pupils reactive, EOMI, normal conjunctiva, no scleral icterus. HENT: Normocephalic, atraumatic, normal hearing, moist oral mucosa Neck: Supple, non-tender, no cervical  lymphadenopathy. Lungs: Chest rise is symmetric, non-labored respiration, chest wall nontender to palpation Heart: Normal rate by palpation, normal peripheral perfusion Abdomen: Soft, non-tender, non-distended. Pelvis is stable. Skin: Skin envelope intact, dry and pink, no rashes or lesions, no signs of infection. Neurologic: Awake, alert, and oriented X3 Psychiatric: Cooperative, appropriate mood and affect.  Musculoskeletal: Evaluation of the patient's nondominant left upper extremity reveals a well-fitting short arm cast.  He has a good range of motion in his fingers and thumb.  He has no significant swelling in his digits.  He is able to move his fingers and thumb well on command.  He is neurologically intact to the radial, median, and ulnar nerves distally.  The patient does have some bruising on his left upper extremity from his recent  fall.  Radiographic findings: After evaluating the patient I did not feel that repeat radiographic evaluation was warranted.   Labs:  Recent Labs    03/21/24 0350 03/22/24 0417  HGB 11.1* 10.8*   Recent Labs    03/21/24 0350 03/22/24 0417  WBC 30.0* 32.7*  RBC 3.50* 3.42*  HCT 34.4* 33.1*  PLT 147* 170   Recent Labs    03/21/24 0350 03/22/24 0417  NA 138 138  K 3.9 3.7  CL 105 105  CO2 25 23  BUN 21 15  CREATININE 1.10 0.82  GLUCOSE 103* 105*  CALCIUM 8.8* 8.5*   Recent Labs    03/21/24 0349  INR 1.2     Assessment/Plan:    Assessment: 89 year old male admitted for pneumonia with recent ground-level fall having been evaluated at both the [place] [facility] and nonoperative treatment of his left wrist fracture was recommended.   Plan: I had a 30-minute discussion with the patient and his family member that certainly we could cut the cast off and repeat his radiographic evaluation but I think that is a bit much.  As his cast is very well-fitting and he is being hospitalized for life-saving medical  treatment for his pneumonia I recommend we continue the cast treatment.  He can follow-up with whichever orthopedic clinic he prefers to continue his treatment with.  The family feels that they will be discharged medically improved tomorrow and they will follow-up early this week.  Their questions were answered about nonoperative versus operative treatment and are happy with my explanation.  We will be available if needed, otherwise we will sign off.  I instructed the patient to elevate the left upper extremity on pillows and to work on a gentle early range of motion program in his fingers and thumb.  They expressed an understanding.  Lauree Pomfret M.D. 03/22/2024 1:32 PM

## 2024-03-22 NOTE — Progress Notes (Addendum)
 Progress Note    Dylan Weber  UJW:[id number] DOB: February 07, 1933  DOA: 03/21/2024 PCP: Will Hare, NP      Brief Narrative:    Medical records reviewed and are as summarized below:  Dylan Weber is a 89 y.o. male with medical history significant for COPD, history of chronic hypoxic respiratory failure no longer on oxygen, history of PE, history of SVT, who presented to the hospital because of nausea, intermittent chest pain and shortness of breath.  Patient was found to be hypoxic  with oxygen saturation in the 80s by EMS.  WBC 30 and CT chest showed  right upper lobe pneumonia.       Assessment/Plan:   Principal Problem:   Acute respiratory failure with hypoxia (HCC) Active Problems:   SVT (supraventricular tachycardia) (HCC)   COPD (chronic obstructive pulmonary disease) (HCC)   Pulmonary embolism (HCC)   CAD (coronary artery disease)   Leukocytosis   Body mass index is 22.15 kg/m.   Sepsis secondary to community-acquired pneumonia leukocytosis: WBC up from 30-32.  Continue empiric IV antibiotics. Discontinue IV fluids later today. Follow-up blood cultures.  Strep pneumo urine antigen was negative.   Acute hypoxic respiratory failure: Continue 2 L/min oxygen via [place].   Oxygen saturation on room air at rest 88%, oxygen saturation on room air while ambulating 80%, oxygen saturation on 2 L oxygen while ambulating 94%. Wean off oxygen as able, otherwise he may need home oxygen. History of chronic hypoxic respiratory failure but no longer on oxygen   COPD: Stable.  Continue bronchodilators.   CAD, history of SVT: Continue bisoprolol    4 mm right lower lobe nodule: Recommended outpatient follow-up with CT chest in 1 year given his history of tobacco use disorder.  Initially quit smoking about 10 years ago.   Recent left wrist injury with cast on left arm: X-ray on 03/12/2024 did not show any fracture or dislocation but there was evidence of moderate to  severe carpometacarpal degenerative disease.  Patient requested that cast on left forearm be removed.  Consulted Dr. Artie Bimler, orthopedic surgeon, to assist with cast removal.   Diet Order             Diet heart healthy/carb modified Room service appropriate? Yes; Fluid consistency: Thin  Diet effective now                            Consultants: Orthopedic surgeon  Procedures: None    Medications:    bisoprolol   2.5 mg Oral Daily   enoxaparin  (LOVENOX ) injection  40 mg Subcutaneous Q24H   feeding supplement  237 mL Oral BID BM   pantoprazole   20 mg Oral Daily   Continuous Infusions:  sodium chloride  75 mL/hr at 03/22/24 0119   azithromycin  500 mg (03/22/24 0522)   cefTRIAXone (ROCEPHIN)  IV 2 g (03/22/24 0431)     Anti-infectives (From admission, onward)    Start     Dose/Rate Route Frequency Ordered Stop   03/22/24 0500  cefTRIAXone (ROCEPHIN) 2 g in sodium chloride  0.9 % 100 mL IVPB        2 g 200 mL/hr over 30 Minutes Intravenous Every 24 hours 03/21/24 0801 03/27/24 0459   03/22/24 0500  azithromycin  (ZITHROMAX ) 500 mg in sodium chloride  0.9 % 250 mL IVPB        500 mg 250 mL/hr over 60 Minutes Intravenous Every 24 hours 03/21/24 0801 03/27/24 0459   03/21/24  0715  cefTRIAXone (ROCEPHIN) 1 g in sodium chloride  0.9 % 100 mL IVPB        1 g 200 mL/hr over 30 Minutes Intravenous  Once 03/21/24 0703 03/21/24 0744   03/21/24 0715  azithromycin  (ZITHROMAX ) 500 mg in sodium chloride  0.9 % 250 mL IVPB        500 mg 250 mL/hr over 60 Minutes Intravenous  Once 03/21/24 0703 03/21/24 0820              Family Communication/Anticipated D/C date and plan/Code Status   DVT prophylaxis: enoxaparin  (LOVENOX ) injection 40 mg Start: 03/21/24 2200     Code Status: Full Code  Family Communication: Plan discussed with his wife over the phone Disposition Plan: Plan to discharge home   Status is: Observation The patient will require care spanning > 2  midnights and should be moved to inpatient because: Pneumonia, severe leukocytosis       Subjective:   Interval events noted.  He feels a little better today.  No shortness of breath or chest pain.  He requested that cast on the left forearm be removed  Objective:    Vitals:   03/22/24 0023 03/22/24 0416 03/22/24 0746 03/22/24 1156  BP: 135/73 128/70 120/84 116/78  Pulse: 73 82 88 70  Resp: 20 16 15    Temp: 98.7 F (37.1 C) 98.5 F (36.9 C) 98.2 F (36.8 C) (!) 97.5 F (36.4 C)  TempSrc: Oral Oral    SpO2: 95% 91% 93% 98%  Weight:      Height:       No data found.   Intake/Output Summary (Last 24 hours) at 03/22/2024 1354 Last data filed at 03/22/2024 0900 Gross per 24 hour  Intake 930.22 ml  Output 1825 ml  Net -894.78 ml   Filed Weights   03/21/24 0345  Weight: 68 kg    Exam:  GEN: NAD SKIN: Warm and dry EYES: No pallor or icterus ENT: MMM CV: RRR PULM: CTA B ABD: soft, ND, NT, +BS CNS: AAO x 3, non focal EXT: No edema or tenderness.  Cast on left arm        Data Reviewed:   I have personally reviewed following labs and imaging studies:  Labs: Labs show the following:   Basic Metabolic Panel: Recent Labs  Lab 03/21/24 0350 03/22/24 0417  NA 138 138  K 3.9 3.7  CL 105 105  CO2 25 23  GLUCOSE 103* 105*  BUN 21 15  CREATININE 1.10 0.82  CALCIUM 8.8* 8.5*   GFR Estimated Creatinine Clearance: 59.9 mL/min (by C-G formula based on SCr of 0.82 mg/dL). Liver Function Tests: Recent Labs  Lab 03/22/24 0417  AST 32  ALT 30  ALKPHOS 58  BILITOT 0.7  PROT 7.5  ALBUMIN 3.3*   No results for input(s): "LIPASE", "AMYLASE" in the last 168 hours. No results for input(s): "AMMONIA" in the last 168 hours. Coagulation profile Recent Labs  Lab 03/21/24 0349  INR 1.2    CBC: Recent Labs  Lab 03/21/24 0350 03/22/24 0417  WBC 30.0* 32.7*  HGB 11.1* 10.8*  HCT 34.4* 33.1*  MCV 98.3 96.8  PLT 147* 170   Cardiac Enzymes: No  results for input(s): "CKTOTAL", "CKMB", "CKMBINDEX", "TROPONINI" in the last 168 hours. BNP (last 3 results) No results for input(s): "PROBNP" in the last 8760 hours. CBG: No results for input(s): "GLUCAP" in the last 168 hours. D-Dimer: No results for input(s): "DDIMER" in the last 72 hours. Hgb A1c:  No results for input(s): "HGBA1C" in the last 72 hours. Lipid Profile: No results for input(s): "CHOL", "HDL", "LDLCALC", "TRIG", "CHOLHDL", "LDLDIRECT" in the last 72 hours. Thyroid  function studies: No results for input(s): "TSH", "T4TOTAL", "T3FREE", "THYROIDAB" in the last 72 hours.  Invalid input(s): "FREET3" Anemia work up: No results for input(s): "VITAMINB12", "FOLATE", "FERRITIN", "TIBC", "IRON", "RETICCTPCT" in the last 72 hours. Sepsis Labs: Recent Labs  Lab 03/21/24 0350 03/21/24 0418 03/22/24 0417  WBC 30.0*  --  32.7*  LATICACIDVEN  --  0.7  --     Microbiology Recent Results (from the past 240 hours)  Blood Culture (routine x 2)     Status: None (Preliminary result)   Collection Time: 03/21/24  4:18 AM   Specimen: BLOOD  Result Value Ref Range Status   Specimen Description BLOOD BLOOD RIGHT ARM  Final   Special Requests   Final    BOTTLES DRAWN AEROBIC AND ANAEROBIC Blood Culture results may not be optimal due to an inadequate volume of blood received in culture bottles   Culture   Final    NO GROWTH 1 DAY Performed at [facility], [address] [place], [place] [address]    Report Status PENDING  Incomplete  Blood Culture (routine x 2)     Status: None (Preliminary result)   Collection Time: 03/21/24  4:18 AM   Specimen: BLOOD  Result Value Ref Range Status   Specimen Description BLOOD RH  Final   Special Requests   Final    BOTTLES DRAWN AEROBIC AND ANAEROBIC Blood Culture results may not be optimal due to an inadequate volume of blood received in culture bottles   Culture   Final    NO GROWTH 1 DAY Performed at [facility], [address] [place], [place] [address]    Report Status PENDING  Incomplete  Respiratory (~20 pathogens) panel by PCR     Status: None   Collection Time: 03/21/24  8:24 AM   Specimen: Nasopharyngeal Swab; Respiratory  Result Value Ref Range Status   Adenovirus NOT DETECTED NOT DETECTED Final   Coronavirus 229E NOT DETECTED NOT DETECTED Final    Comment: (NOTE) The Coronavirus on the Respiratory Panel, DOES NOT test for the novel  Coronavirus (2019 nCoV)    Coronavirus HKU1 NOT DETECTED NOT DETECTED Final   Coronavirus NL63 NOT DETECTED NOT DETECTED Final   Coronavirus OC43 NOT DETECTED NOT DETECTED Final   Metapneumovirus NOT DETECTED NOT DETECTED Final   Rhinovirus / Enterovirus NOT DETECTED NOT DETECTED Final   Influenza A NOT DETECTED NOT DETECTED Final   Influenza B NOT DETECTED NOT DETECTED Final   Parainfluenza Virus 1 NOT DETECTED NOT DETECTED Final   Parainfluenza Virus 2 NOT DETECTED NOT DETECTED Final   Parainfluenza Virus 3 NOT DETECTED NOT DETECTED Final   Parainfluenza Virus 4 NOT DETECTED NOT DETECTED Final   Respiratory Syncytial Virus NOT DETECTED NOT DETECTED Final   Bordetella pertussis NOT DETECTED NOT DETECTED Final   Bordetella Parapertussis NOT DETECTED NOT DETECTED Final   Chlamydophila pneumoniae NOT DETECTED NOT DETECTED Final   Mycoplasma pneumoniae NOT DETECTED NOT DETECTED Final    Comment: Performed at [facility], [address] [place], [place] [address]    Procedures and diagnostic studies:  CT Angio Chest PE W/Cm &/Or Wo Cm Result Date: 03/21/2024 CLINICAL DATA:  Evaluate for acute pulmonary embolism. EXAM: CT ANGIOGRAPHY CHEST WITH CONTRAST TECHNIQUE: Multidetector CT imaging of the chest was performed using  the standard protocol during bolus administration of intravenous contrast. Multiplanar CT image reconstructions and MIPs were obtained to evaluate the vascular anatomy. RADIATION DOSE REDUCTION: This exam was performed according to the  departmental dose-optimization program which includes automated exposure control, adjustment of the mA and/or kV according to patient size and/or use of iterative reconstruction technique. CONTRAST:  75mL OMNIPAQUE  IOHEXOL  350 MG/ML SOLN COMPARISON:  06/11/2023 FINDINGS: Cardiovascular: Satisfactory opacification of the pulmonary arteries to the segmental level. No evidence of pulmonary embolism. Normal heart size. No pericardial effusion. Aortic atherosclerosis. The ascending thoracic aorta measures 4 cm, image 43/7. Coronary artery calcifications. Mediastinum/Nodes: Thyroid  gland, trachea, and esophagus appear normal. No mediastinal adenopathy. Right hilar node measures 1.5 cm, image 61/4. Lungs/Pleura: Emphysema with diffuse bronchial wall thickening. Within the anterior right upper lobe there is a patchy area of ground-glass and airspace density, image 54/5. Right upper lobe lung nodule measures 5 mm, image 38/5. Unchanged from the previous exam. This is been stable since 12/18/2022 compatible with a benign nodule. Peripheral nodule within the right upper lobe measuring 2 mm is also unchanged from 12/28/2022. 4 mm nodule in the right lower lobe appears new, image 78/5. Upper Abdomen: No acute abnormality.  Cholecystectomy. Musculoskeletal: Thoracic degenerative disc disease. No acute or suspicious osseous findings. Review of the MIP images confirms the above findings. IMPRESSION: 1. No evidence for acute pulmonary embolism. 2. Patchy area of ground-glass and airspace density within the anterior right upper lobe is favored to represent an area of pneumonia. Recommend follow-up imaging to ensure resolution and to rule out underlying malignancy. 3. Right hilar lymphadenopathy is favored to be reactive. 4. New 4 mm nodule in the right lower lobe. If the patient is at high risk for bronchogenic carcinoma, follow-up chest CT at 1year is recommended. If the patient is at low risk, no follow-up is needed. This  recommendation follows the consensus statement: Guidelines for Management of Small Pulmonary Nodules Detected on CT Scans: A Statement from the Fleischner Society as published in Radiology 2005; 237:395-400. 5. Coronary artery calcifications. 6. 4 cm ascending thoracic aorta. Recommend annual imaging followup by CTA or MRA. This recommendation follows 2010 ACCF/AHA/AATS/ACR/ASA/SCA/SCAI/SIR/STS/SVM Guidelines for the Diagnosis and Management of Patients with Thoracic Aortic Disease. Circulation. 2010; 121: Z610-R604. Aortic aneurysm NOS (ICD10-I71.9) 7. Aortic Atherosclerosis (ICD10-I70.0) and Emphysema (ICD10-J43.9). Electronically Signed   By: Kimberley Penman M.D.   On: 03/21/2024 06:44   DG Chest Portable 1 View Result Date: 03/21/2024 CLINICAL DATA:  Shortness of breath and hypoxia.  Chest pain. EXAM: PORTABLE CHEST 1 VIEW COMPARISON:  06/11/2023. FINDINGS: The heart size and mediastinal contours are within normal limits. Faint right upper lobe opacity is noted concerning for early pneumonia. Left lung appears clear. The visualized osseous structures are intact. The visualized skeletal structures are unremarkable. IMPRESSION: Faint right upper lobe opacity concerning for early pneumonia. Electronically Signed   By: Kimberley Penman M.D.   On: 03/21/2024 06:34               LOS: 0 days   Falon Huesca  [facility]   Pager on [email or web address]. If 7PM-7AM, please contact night-coverage at [email or web address]     03/22/2024, 1:54 PM

## 2024-03-22 NOTE — Care Management Obs Status (Signed)
 MEDICARE OBSERVATION STATUS NOTIFICATION   Patient Details  Name: Marcello Kaczka MRN: [id number] Date of Birth: Mar 31, 1933   Medicare Observation Status Notification Given:  Yes    Anise Kerns 03/22/2024, 12:31 PM

## 2024-03-23 DIAGNOSIS — J9601 Acute respiratory failure with hypoxia: Secondary | ICD-10-CM | POA: Diagnosis not present

## 2024-03-23 LAB — CBC WITH DIFFERENTIAL/PLATELET
Abs Immature Granulocytes: 2.11 10*3/uL — ABNORMAL HIGH (ref 0.00–0.07)
Basophils Absolute: 0.2 10*3/uL — ABNORMAL HIGH (ref 0.0–0.1)
Basophils Relative: 1 %
Eosinophils Absolute: 0 10*3/uL (ref 0.0–0.5)
Eosinophils Relative: 0 %
HCT: 32.7 % — ABNORMAL LOW (ref 39.0–52.0)
Hemoglobin: 10.7 g/dL — ABNORMAL LOW (ref 13.0–17.0)
Immature Granulocytes: 5 %
Lymphocytes Relative: 16 %
Lymphs Abs: 6.3 10*3/uL — ABNORMAL HIGH (ref 0.7–4.0)
MCH: 31.7 pg (ref 26.0–34.0)
MCHC: 32.7 g/dL (ref 30.0–36.0)
MCV: 96.7 fL (ref 80.0–100.0)
Monocytes Absolute: 24.7 10*3/uL — ABNORMAL HIGH (ref 0.1–1.0)
Monocytes Relative: 60 %
Neutro Abs: 7.3 10*3/uL (ref 1.7–7.7)
Neutrophils Relative %: 18 %
Platelets: 164 10*3/uL (ref 150–400)
RBC: 3.38 MIL/uL — ABNORMAL LOW (ref 4.22–5.81)
RDW: 15.9 % — ABNORMAL HIGH (ref 11.5–15.5)
Smear Review: NORMAL
WBC: 40.6 10*3/uL — ABNORMAL HIGH (ref 4.0–10.5)
nRBC: 0.2 % (ref 0.0–0.2)

## 2024-03-23 LAB — BASIC METABOLIC PANEL WITH GFR
Anion gap: 7 (ref 5–15)
BUN: 11 mg/dL (ref 8–23)
CO2: 25 mmol/L (ref 22–32)
Calcium: 8.6 mg/dL — ABNORMAL LOW (ref 8.9–10.3)
Chloride: 104 mmol/L (ref 98–111)
Creatinine, Ser: 0.95 mg/dL (ref 0.61–1.24)
GFR, Estimated: >60 mL/min (ref >60)
Glucose, Bld: 104 mg/dL — ABNORMAL HIGH (ref 70–99)
Potassium: 3.6 mmol/L (ref 3.5–5.1)
Sodium: 136 mmol/L (ref 135–145)

## 2024-03-23 LAB — LEGIONELLA PNEUMOPHILA SEROGP 1 UR AG: L. pneumophila Serogp 1 Ur Ag: NEGATIVE

## 2024-03-23 LAB — PATHOLOGIST SMEAR REVIEW

## 2024-03-23 MED ORDER — ACETAMINOPHEN 325 MG PO TABS
650.0000 mg | ORAL_TABLET | Freq: Four times a day (QID) | ORAL | Status: DC | PRN
  Administered 2024-03-23 – 2024-03-25 (×3): 650 mg via ORAL
  Filled 2024-03-23 (×3): qty 2

## 2024-03-23 NOTE — Evaluation (Signed)
 Physical Therapy Evaluation Patient Details Name: Dylan Weber MRN: [id number] DOB: 21-Jul-1933 Today's Date: 03/23/2024  History of Present Illness  Dylan Weber is a 89 y.o. male with medical history significant for COPD, history of chronic hypoxic respiratory failure no longer on oxygen, history of PE, history of SVT, who presented to the hospital because of nausea, intermittent chest pain and shortness of breath.   Clinical Impression  Pt performed well with transitions in bed mobility and was able to demonstrate good gait mechanics with cane after being instructed on it.  Pt's spouse requested for cane training and pt responded well to it ambulating down the hall.  Pt does not have stairs at home, so this was not performed.  Pt di have slight dip in O2 saturation levels, but did respond well to pursed lip breathing technique and 2L of supplemental oxygen.  Pt ultimately returned to room and was fatigued.  Pt would benefit from continued therapy outside of the hospital at discharge and would recommend balance training at an outpatient PT facility.  Pt returned to bed and was left with all needs met and call bell within reach.        If plan is discharge home, recommend the following: A little help with walking and/or transfers;A little help with bathing/dressing/bathroom;Help with stairs or ramp for entrance   Can travel by private vehicle        Equipment Recommendations None recommended by PT  Recommendations for Other Services       Functional Status Assessment Patient has had a recent decline in their functional status and demonstrates the ability to make significant improvements in function in a reasonable and predictable amount of time.     Precautions / Restrictions Restrictions Weight Bearing Restrictions Per Provider Order: No      Mobility  Bed Mobility Overal bed mobility: Needs Assistance Bed Mobility: Supine to Sit     Supine to sit: Supervision           Transfers Overall transfer level: Needs assistance Equipment used: Straight cane Transfers: Sit to/from Stand Sit to Stand: Supervision, Contact guard assist                Ambulation/Gait Ambulation/Gait assistance: Contact guard assist Gait Distance (Feet): 160 Feet Assistive device: Straight cane Gait Pattern/deviations: Step-through pattern Gait velocity: slightly reduced.     General Gait Details: Pt given verbal cues for proper sequencing initially, however was able to demonstrate good carryover.  Stairs            Wheelchair Mobility     Tilt Bed    Modified Rankin (Stroke Patients Only)       Balance                                             Pertinent Vitals/Pain      Home Living Family/patient expects to be discharged to:: Private residence Living Arrangements: Spouse/significant other Available Help at Discharge: Family Type of Home: House Home Access: Level entry       Home Layout: One level Home Equipment: None;Tub bench      Prior Function Prior Level of Function : Independent/Modified Independent                     Extremity/Trunk Assessment  Communication        Cognition                                         Cueing       General Comments      Exercises     Assessment/Plan    PT Assessment Patient needs continued PT services  PT Problem List Decreased strength;Decreased activity tolerance;Decreased balance;Decreased mobility;Decreased knowledge of use of DME;Decreased safety awareness       PT Treatment Interventions DME instruction;Gait training;Functional mobility training;Therapeutic activities;Therapeutic exercise;Balance training;Neuromuscular re-education    PT Goals (Current goals can be found in the Care Plan section)  Acute Rehab PT Goals Patient Stated Goal: to get cast off arm. PT Goal Formulation: With patient Time For Goal  Achievement: 04/06/24 Potential to Achieve Goals: Good    Frequency Min 2X/week     Co-evaluation               AM-PAC PT "6 Clicks" Mobility  Outcome Measure Help needed turning from your back to your side while in a flat bed without using bedrails?: A Little Help needed moving from lying on your back to sitting on the side of a flat bed without using bedrails?: A Little Help needed moving to and from a bed to a chair (including a wheelchair)?: A Little Help needed standing up from a chair using your arms (e.g., wheelchair or bedside chair)?: A Little Help needed to walk in hospital room?: A Little Help needed climbing 3-5 steps with a railing? : A Lot 6 Click Score: 17    End of Session Equipment Utilized During Treatment: Gait belt Activity Tolerance: Patient tolerated treatment well Patient left: in bed;with call bell/phone within reach   PT Visit Diagnosis: Other abnormalities of gait and mobility (R26.89);Muscle weakness (generalized) (M62.81);History of falling (Z91.81)    Time: 1610-9604 PT Time Calculation (min) (ACUTE ONLY): 38 min   Charges:   PT Evaluation $PT Eval Low Complexity: 1 Low PT Treatments $Therapeutic Activity: 23-37 mins PT General Charges $$ ACUTE PT VISIT: 1 Visit         Rozanna Corner, PT, DPT Physical Therapist - [facility]  03/23/24, 3:27 PM

## 2024-03-23 NOTE — Progress Notes (Addendum)
 Progress Note    Dylan Weber  ZOX:[id number] DOB: 20-Nov-1932  DOA: 03/21/2024 PCP: Will Hare, NP      Brief Narrative:    Medical records reviewed and are as summarized below:  Dylan Weber is a 89 y.o. male with medical history significant for COPD, history of chronic hypoxic respiratory failure no longer on oxygen, history of PE, history of SVT, who presented to the hospital because of nausea, intermittent chest pain and shortness of breath.  Patient was found to be hypoxic  with oxygen saturation in the 80s by EMS.  WBC 30 and CT chest showed  right upper lobe pneumonia.       Assessment/Plan:   Principal Problem:   Acute respiratory failure with hypoxia (HCC) Active Problems:   SVT (supraventricular tachycardia) (HCC)   COPD (chronic obstructive pulmonary disease) (HCC)   Pulmonary embolism (HCC)   CAD (coronary artery disease)   Leukocytosis   Body mass index is 22.15 kg/m.   Sepsis secondary to community-acquired pneumonia leukocytosis: WBC up from 30-32-40.  Continue empiric IV antibiotics. Follow-up blood cultures.  Strep pneumo urine antigen was negative. Dizziness: Orthostatic vital signs negative   Acute hypoxic respiratory failure: Continue 2 L/min oxygen via [place].   Wean off oxygen as able.  He may need home oxygen given underlying COPD. History of chronic hypoxic respiratory failure but no longer on oxygen   COPD: Stable.  Continue bronchodilators.   Severe leukocytosis, monocyte predominant: Peripheral blood smear showed immature monocytes (monoblasts and promonocytes).  Dr. Brunetta Capes, pathologist, notified me via secure chat about peripheral blood smear report and her concern for acute leukemia.  Consulted Dr. Valentine Gasmen, oncologist, to assist with management.   CAD, history of SVT: Continue bisoprolol    4 mm right lower lobe nodule: Recommended outpatient follow-up with CT chest in 1 year given his history of tobacco use  disorder.  Initially quit smoking about 10 years ago.   Recent left wrist injury with cast on left arm: X-ray on 03/12/2024 did not show any fracture or dislocation but there was evidence of moderate to severe carpometacarpal degenerative disease.  He was evaluated by Dr. Artie Bimler on 03/22/2024.  He agreed to follow-up with orthopedic surgeon to have cast removed.    Mild cognitive impairment/minor neurocognitive disorder: His wife.  Patient has had memory issues and word finding difficulty for almost 2 years now and she thinks that symptoms have gotten worse in the past 6 months.   History of pulmonary embolism in January 2023.  No longer on anticoagulation.    Plan discussed with his wife over the phone.    Diet Order             Diet regular Room service appropriate? Yes; Fluid consistency: Thin  Diet effective now                            Consultants: Orthopedic surgeon Oncologist  Procedures: None    Medications:    bisoprolol   2.5 mg Oral Daily   enoxaparin  (LOVENOX ) injection  40 mg Subcutaneous Q24H   feeding supplement  237 mL Oral BID BM   pantoprazole   20 mg Oral Daily   Continuous Infusions:  azithromycin  500 mg (03/23/24 0459)   cefTRIAXone (ROCEPHIN)  IV 2 g (03/23/24 0408)     Anti-infectives (From admission, onward)    Start     Dose/Rate Route Frequency Ordered Stop   03/22/24 0500  cefTRIAXone (ROCEPHIN) 2 g in sodium chloride  0.9 % 100 mL IVPB        2 g 200 mL/hr over 30 Minutes Intravenous Every 24 hours 03/21/24 0801 03/27/24 0459   03/22/24 0500  azithromycin  (ZITHROMAX ) 500 mg in sodium chloride  0.9 % 250 mL IVPB        500 mg 250 mL/hr over 60 Minutes Intravenous Every 24 hours 03/21/24 0801 03/27/24 0459   03/21/24 0715  cefTRIAXone (ROCEPHIN) 1 g in sodium chloride  0.9 % 100 mL IVPB        1 g 200 mL/hr over 30 Minutes Intravenous  Once 03/21/24 0703 03/21/24 0744   03/21/24 0715  azithromycin  (ZITHROMAX ) 500 mg in  sodium chloride  0.9 % 250 mL IVPB        500 mg 250 mL/hr over 60 Minutes Intravenous  Once 03/21/24 0703 03/21/24 0820              Family Communication/Anticipated D/C date and plan/Code Status   DVT prophylaxis: enoxaparin  (LOVENOX ) injection 40 mg Start: 03/21/24 2200     Code Status: Full Code  Family Communication: Plan discussed with his wife over the phone Disposition Plan: Plan to discharge home   Status is: Inpatient Remains inpatient appropriate because: Pneumonia, severe leukocytosis         Subjective:   Interval events noted.  He complained of "feeling dizzy and warm".  He does not remember that he was seen by an orthopedic surgeon yesterday.  Objective:    Vitals:   03/23/24 0753 03/23/24 0910 03/23/24 1143 03/23/24 1146  BP: 128/66 108/68 102/62 102/62  Pulse:  100 93   Resp:   (!) 24   Temp: 97.8 F (36.6 C) 97.9 F (36.6 C)  98.4 F (36.9 C)  TempSrc:      SpO2:  91%    Weight:      Height:       Orthostatic VS for the past 24 hrs:  BP- Lying Pulse- Lying BP- Sitting Pulse- Sitting BP- Standing at 0 minutes Pulse- Standing at 0 minutes  03/23/24 1146 107/71 79 103/80 86 103/74 96     Intake/Output Summary (Last 24 hours) at 03/23/2024 1355 Last data filed at 03/23/2024 1059 Gross per 24 hour  Intake 720 ml  Output 250 ml  Net 470 ml   Filed Weights   03/21/24 0345  Weight: 68 kg    Exam:  GEN: NAD SKIN: Warm and dry EYES: No pallor or icterus  ENT: MMM CV: Regular rate and rhythm PULM: Decreased air entry bilaterally.  Mild rhonchi ABD: soft, ND, NT, +BS CNS: AAO x 3, non focal EXT: No edema or tenderness.  Cast on left forearm       Data Reviewed:   I have personally reviewed following labs and imaging studies:  Labs: Labs show the following:   Basic Metabolic Panel: Recent Labs  Lab 03/21/24 0350 03/22/24 0417 03/23/24 0400  NA 138 138 136  K 3.9 3.7 3.6  CL 105 105 104  CO2 25 23 25   GLUCOSE  103* 105* 104*  BUN 21 15 11   CREATININE 1.10 0.82 0.95  CALCIUM 8.8* 8.5* 8.6*   GFR Estimated Creatinine Clearance: 51.7 mL/min (by C-G formula based on SCr of 0.95 mg/dL). Liver Function Tests: Recent Labs  Lab 03/22/24 0417  AST 32  ALT 30  ALKPHOS 58  BILITOT 0.7  PROT 7.5  ALBUMIN 3.3*   No results for input(s): "LIPASE", "AMYLASE" in the  last 168 hours. No results for input(s): "AMMONIA" in the last 168 hours. Coagulation profile Recent Labs  Lab 03/21/24 0349  INR 1.2    CBC: Recent Labs  Lab 03/21/24 0350 03/22/24 0417 03/23/24 0400  WBC 30.0* 32.7* 40.6*  NEUTROABS  --   --  7.3  HGB 11.1* 10.8* 10.7*  HCT 34.4* 33.1* 32.7*  MCV 98.3 96.8 96.7  PLT 147* 170 164   Cardiac Enzymes: No results for input(s): "CKTOTAL", "CKMB", "CKMBINDEX", "TROPONINI" in the last 168 hours. BNP (last 3 results) No results for input(s): "PROBNP" in the last 8760 hours. CBG: No results for input(s): "GLUCAP" in the last 168 hours. D-Dimer: No results for input(s): "DDIMER" in the last 72 hours. Hgb A1c: No results for input(s): "HGBA1C" in the last 72 hours. Lipid Profile: No results for input(s): "CHOL", "HDL", "LDLCALC", "TRIG", "CHOLHDL", "LDLDIRECT" in the last 72 hours. Thyroid  function studies: No results for input(s): "TSH", "T4TOTAL", "T3FREE", "THYROIDAB" in the last 72 hours.  Invalid input(s): "FREET3" Anemia work up: No results for input(s): "VITAMINB12", "FOLATE", "FERRITIN", "TIBC", "IRON", "RETICCTPCT" in the last 72 hours. Sepsis Labs: Recent Labs  Lab 03/21/24 0350 03/21/24 0418 03/22/24 0417 03/23/24 0400  WBC 30.0*  --  32.7* 40.6*  LATICACIDVEN  --  0.7  --   --     Microbiology Recent Results (from the past 240 hours)  Blood Culture (routine x 2)     Status: None (Preliminary result)   Collection Time: 03/21/24  4:18 AM   Specimen: BLOOD  Result Value Ref Range Status   Specimen Description BLOOD BLOOD RIGHT ARM  Final   Special  Requests   Final    BOTTLES DRAWN AEROBIC AND ANAEROBIC Blood Culture results may not be optimal due to an inadequate volume of blood received in culture bottles   Culture   Final    NO GROWTH 2 DAYS Performed at [facility], [address] [place], [place] [address]    Report Status PENDING  Incomplete  Blood Culture (routine x 2)     Status: None (Preliminary result)   Collection Time: 03/21/24  4:18 AM   Specimen: BLOOD  Result Value Ref Range Status   Specimen Description BLOOD RH  Final   Special Requests   Final    BOTTLES DRAWN AEROBIC AND ANAEROBIC Blood Culture results may not be optimal due to an inadequate volume of blood received in culture bottles   Culture   Final    NO GROWTH 2 DAYS Performed at [facility], [address] [place], [place] [address]    Report Status PENDING  Incomplete  Respiratory (~20 pathogens) panel by PCR     Status: None   Collection Time: 03/21/24  8:24 AM   Specimen: Nasopharyngeal Swab; Respiratory  Result Value Ref Range Status   Adenovirus NOT DETECTED NOT DETECTED Final   Coronavirus 229E NOT DETECTED NOT DETECTED Final    Comment: (NOTE) The Coronavirus on the Respiratory Panel, DOES NOT test for the novel  Coronavirus (2019 nCoV)    Coronavirus HKU1 NOT DETECTED NOT DETECTED Final   Coronavirus NL63 NOT DETECTED NOT DETECTED Final   Coronavirus OC43 NOT DETECTED NOT DETECTED Final   Metapneumovirus NOT DETECTED NOT DETECTED Final   Rhinovirus / Enterovirus NOT DETECTED NOT DETECTED Final   Influenza A NOT DETECTED NOT DETECTED Final   Influenza B NOT DETECTED NOT DETECTED Final   Parainfluenza Virus 1 NOT DETECTED NOT DETECTED Final   Parainfluenza  Virus 2 NOT DETECTED NOT DETECTED Final   Parainfluenza Virus 3 NOT DETECTED NOT DETECTED Final   Parainfluenza Virus 4 NOT DETECTED NOT DETECTED Final   Respiratory Syncytial Virus NOT DETECTED NOT DETECTED Final   Bordetella pertussis NOT DETECTED NOT DETECTED  Final   Bordetella Parapertussis NOT DETECTED NOT DETECTED Final   Chlamydophila pneumoniae NOT DETECTED NOT DETECTED Final   Mycoplasma pneumoniae NOT DETECTED NOT DETECTED Final    Comment: Performed at [facility], [address], [place], [place] [address]    Procedures and diagnostic studies:  No results found.              LOS: 1 day   Averie Meiner  [facility]   Pager on [email or web address]. If 7PM-7AM, please contact night-coverage at [email or web address]     03/23/2024, 1:55 PM

## 2024-03-23 NOTE — Plan of Care (Signed)

## 2024-03-24 DIAGNOSIS — J9601 Acute respiratory failure with hypoxia: Secondary | ICD-10-CM | POA: Diagnosis not present

## 2024-03-24 LAB — CBC WITH DIFFERENTIAL/PLATELET
Abs Immature Granulocytes: 2.33 10*3/uL — ABNORMAL HIGH (ref 0.00–0.07)
Basophils Absolute: 0.3 10*3/uL — ABNORMAL HIGH (ref 0.0–0.1)
Basophils Relative: 1 %
Eosinophils Absolute: 0 10*3/uL (ref 0.0–0.5)
Eosinophils Relative: 0 %
HCT: 33.9 % — ABNORMAL LOW (ref 39.0–52.0)
Hemoglobin: 10.8 g/dL — ABNORMAL LOW (ref 13.0–17.0)
Immature Granulocytes: 5 %
Lymphocytes Relative: 15 %
Lymphs Abs: 6.9 10*3/uL — ABNORMAL HIGH (ref 0.7–4.0)
MCH: 31.4 pg (ref 26.0–34.0)
MCHC: 31.9 g/dL (ref 30.0–36.0)
MCV: 98.5 fL (ref 80.0–100.0)
Monocytes Absolute: 28.3 10*3/uL — ABNORMAL HIGH (ref 0.1–1.0)
Monocytes Relative: 63 %
Neutro Abs: 7.4 10*3/uL (ref 1.7–7.7)
Neutrophils Relative %: 16 %
Platelets: 168 10*3/uL (ref 150–400)
RBC: 3.44 MIL/uL — ABNORMAL LOW (ref 4.22–5.81)
RDW: 15.6 % — ABNORMAL HIGH (ref 11.5–15.5)
Smear Review: NORMAL
WBC: 45.2 10*3/uL — ABNORMAL HIGH (ref 4.0–10.5)
nRBC: 0.5 % — ABNORMAL HIGH (ref 0.0–0.2)

## 2024-03-24 LAB — PROTIME-INR
INR: 1.3 — ABNORMAL HIGH (ref 0.8–1.2)
Prothrombin Time: 16.7 s — ABNORMAL HIGH (ref 11.4–15.2)

## 2024-03-24 LAB — LACTATE DEHYDROGENASE: LDH: 403 U/L — ABNORMAL HIGH (ref 98–192)

## 2024-03-24 LAB — EXPECTORATED SPUTUM ASSESSMENT W GRAM STAIN, RFLX TO RESP C

## 2024-03-24 LAB — FIBRINOGEN: Fibrinogen: 589 mg/dL — ABNORMAL HIGH (ref 210–475)

## 2024-03-24 LAB — APTT: aPTT: 39 s — ABNORMAL HIGH (ref 24–36)

## 2024-03-24 NOTE — Consult Note (Signed)
 Chief Complaint: Leukocytosis with myeloblasts on peripheral smear concerning for acute leukemia - IR consulted for image guided bone marrow biopsy  Referring Provider(s): Gwyn Leos, MD   Supervising Physician: Alyssa Jumper  Patient Status: [facility] - In-pt  History of Present Illness: Dylan Weber is a 89 y.o. male with pmhx of COPD, history of chronic hypoxic respiratory failure no longer on oxygen, history of PE, history of SVT. Currently in the hospital after presenting for chest pain and SOB - was found to be hypoxic with elevated white count and concern for pneumonia on imaging. From patients CBC with peripheral smear, severe leukocytosis was found along with immature monocytes. Pathologist had informed the hospitalist findings were concerning for acute leukemia and IR has been now consulted to perform image guided bone marrow biopsy.  Plan for procedure tomorrow, 03/25/24. Pt to be NPO at midnight.    Patient is Full Code  Past Medical History:  Diagnosis Date   Basal cell carcinoma 06/19/2022   Dorsum nose. Nodular. EDC 07/17/2022   BCC (basal cell carcinoma) 10/11/2022   right nasal supratip, superficial and nodular, EDC 12/19/22   COPD (chronic obstructive pulmonary disease) (HCC)    Coronary artery calcification seen on CT scan 11/17/2021   COVID-19 10/2021   Diastolic dysfunction    a. 11/2021 Echo: EF 55-60%, no rwma, GrI DD, nl RV fxn. Mild AI.   GERD (gastroesophageal reflux disease)    Pulmonary embolism (HCC)    a. 11/2021 CTA Chest: small peripheral PE in RLL branch of PA-->Eliquis .   Right leg DVT (HCC)    a. 11/2021 LE U/S: occlusive R calf vein DVT in R peroneal vein-->eliquis .   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 [facility] 07/17/2022   SVT (supraventricular tachycardia) (HCC)    a. Dx 10/2021 in setting of COVID infxn.    Past  Surgical History:  Procedure Laterality Date   APPENDECTOMY     CHOLECYSTECTOMY     EYE SURGERY      Allergies: Pregabalin, Diltiazem , Prednisone , Meloxicam, Amoxicillin, and Sulfamethoxazole-trimethoprim  Medications: Prior to Admission medications   Medication Sig Start Date End Date Taking? Authorizing Provider  acetaminophen  (TYLENOL ) 650 MG CR tablet Take 650 mg by mouth as needed for pain.   Yes [provider]  acetaminophen -codeine (TYLENOL  #3) 300-30 MG tablet Take 1 tablet by mouth every 8 (eight) hours as needed for moderate pain (pain score 4-6). 03/20/24  Yes [provider]  albuterol  (VENTOLIN  HFA) 108 (90 Base) MCG/ACT inhaler Inhale 2 puffs into the lungs every 6 (six) hours as needed for wheezing or shortness of breath. 11/01/21  Yes Wieting, Richard, MD  bisoprolol  (ZEBETA ) 5 MG tablet TAKE 1 TABLET (5 MG TOTAL) BY MOUTH IN THE MORNING AND AT BEDTIME 03/04/24  Yes Gollan, Timothy J, MD  COMBIVENT  RESPIMAT 20-100 MCG/ACT AERS respimat Inhale 1 puff into the lungs every 6 (six) hours as needed.   Yes [provider]  Multiple Vitamin (MULTIVITAMIN) capsule Take 1 capsule by mouth daily.   Yes [provider]  pantoprazole  (PROTONIX ) 20 MG tablet Take 20 mg by mouth daily.   Yes [provider]  Apoaequorin (PREVAGEN PO) Take by mouth daily. Patient not taking: Reported on 03/21/2024    [provider]  ciclopirox  (LOPROX ) 0.77 % SUSP Apply to toenails QD-BID. 10/11/22   Moye, Virginia , MD  doxepin (SINEQUAN) 10 MG capsule Take 10 mg by mouth at bedtime. Patient not taking: Reported on 02/09/2022 12/25/21 12/25/22  [provider]  gabapentin (NEURONTIN) 100 MG capsule Take 100 mg by mouth at bedtime. Patient not taking: Reported on 03/21/2024 05/02/23 07/16/23  [provider]  mupirocin  ointment (BACTROBAN ) 2 % Apply 1 Application topically daily. 02/20/23   Moye, Virginia , MD  Probiotic Product (PROBIOTIC  ADVANCED PO) Take by mouth daily.    [provider]  torsemide  (DEMADEX ) 10 MG tablet Take 5 tablets (50 mg total) by mouth every other day. May take 50 mg on off days as needed for swelling. Patient not taking: Reported on 03/21/2024 05/08/23   Gollan, Timothy J, MD  traMADol (ULTRAM) 50 MG tablet Take 50 mg by mouth 2 (two) times daily. For up to 5 days Patient not taking: Reported on 03/21/2024 03/17/24   [provider]     Family History  Problem Relation Age of Onset   Uterine cancer Mother    Brain cancer Sister     Social History   Socioeconomic History   Marital status: Married    Spouse name: Not on file   Number of children: Not on file   Years of education: Not on file   Highest education level: Not on file  Occupational History   Not on file  Tobacco Use   Smoking status: Former    Types: Cigarettes   Smokeless tobacco: Former  Advertising account planner   Vaping status: Never Used  Substance and Sexual Activity   Alcohol use: Yes    Alcohol/week: 7.0 standard drinks of alcohol    Types: 7 Shots of liquor per week   Drug use: Never   Sexual activity: Not Currently  Other Topics Concern   Not on file  Social History Narrative   Not on file   Social Drivers of Health   Financial Resource Strain: Low Risk  (12/02/2023)   Received from [facility]   Overall Financial Resource Strain (CARDIA)    Difficulty of Paying Living Expenses: Not hard at all  Food Insecurity: No Food Insecurity (03/21/2024)   Hunger Vital Sign    Worried About Running Out of Food in the Last Year: Never true    Ran Out of Food in the Last Year: Never true  Transportation Needs: No Transportation Needs (03/21/2024)   PRAPARE - Administrator, Civil Service (Medical): No    Lack of Transportation (Non-Medical): No  Physical Activity: Not on file  Stress: Not on file  Social Connections: Unknown (03/21/2024)   Social Connection and Isolation Panel [NHANES]     Frequency of Communication with Friends and Family: Once a week    Frequency of Social Gatherings with Friends and Family: Once a week    Attends Religious Services: Not on Marketing executive or Organizations: No    Attends Engineer, structural: Not on file    Marital Status: Married     Review of Systems: A 12 point ROS discussed and pertinent positives are indicated in the HPI above.  All other systems are negative.    Vital Signs: BP 111/72 (BP Location: Right Arm)   Pulse 87   Temp 97.7 F (36.5 C)   Resp 18   Ht 5\' 9"  (1.753 m)   Wt 150 lb (68 kg)   SpO2 97%   BMI 22.15 kg/m   Advance Care Plan: No documents  on file   Physical Exam Vitals and nursing note reviewed.  HENT:     Mouth/Throat:     Mouth: Mucous membranes are moist.     Pharynx: Oropharynx is clear.  Cardiovascular:     Rate and Rhythm: Normal rate and regular rhythm.  Pulmonary:     Effort: Pulmonary effort is normal.     Breath sounds: Normal breath sounds.  Abdominal:     Palpations: Abdomen is soft.     Tenderness: There is no abdominal tenderness.  Musculoskeletal:     Right lower leg: No edema.     Left lower leg: No edema.     Comments: Cast noted on left forearm  Skin:    General: Skin is warm and dry.  Neurological:     Mental Status: He is alert and oriented to person, place, and time. Mental status is at baseline.     Imaging: CT Angio Chest PE W/Cm &/Or Wo Cm Result Date: 03/21/2024 CLINICAL DATA:  Evaluate for acute pulmonary embolism. EXAM: CT ANGIOGRAPHY CHEST WITH CONTRAST TECHNIQUE: Multidetector CT imaging of the chest was performed using the standard protocol during bolus administration of intravenous contrast. Multiplanar CT image reconstructions and MIPs were obtained to evaluate the vascular anatomy. RADIATION DOSE REDUCTION: This exam was performed according to the departmental dose-optimization program which includes automated exposure control,  adjustment of the mA and/or kV according to patient size and/or use of iterative reconstruction technique. CONTRAST:  75mL OMNIPAQUE  IOHEXOL  350 MG/ML SOLN COMPARISON:  06/11/2023 FINDINGS: Cardiovascular: Satisfactory opacification of the pulmonary arteries to the segmental level. No evidence of pulmonary embolism. Normal heart size. No pericardial effusion. Aortic atherosclerosis. The ascending thoracic aorta measures 4 cm, image 43/7. Coronary artery calcifications. Mediastinum/Nodes: Thyroid  gland, trachea, and esophagus appear normal. No mediastinal adenopathy. Right hilar node measures 1.5 cm, image 61/4. Lungs/Pleura: Emphysema with diffuse bronchial wall thickening. Within the anterior right upper lobe there is a patchy area of ground-glass and airspace density, image 54/5. Right upper lobe lung nodule measures 5 mm, image 38/5. Unchanged from the previous exam. This is been stable since 12/18/2022 compatible with a benign nodule. Peripheral nodule within the right upper lobe measuring 2 mm is also unchanged from 12/28/2022. 4 mm nodule in the right lower lobe appears new, image 78/5. Upper Abdomen: No acute abnormality.  Cholecystectomy. Musculoskeletal: Thoracic degenerative disc disease. No acute or suspicious osseous findings. Review of the MIP images confirms the above findings. IMPRESSION: 1. No evidence for acute pulmonary embolism. 2. Patchy area of ground-glass and airspace density within the anterior right upper lobe is favored to represent an area of pneumonia. Recommend follow-up imaging to ensure resolution and to rule out underlying malignancy. 3. Right hilar lymphadenopathy is favored to be reactive. 4. New 4 mm nodule in the right lower lobe. If the patient is at high risk for bronchogenic carcinoma, follow-up chest CT at 1year is recommended. If the patient is at low risk, no follow-up is needed. This recommendation follows the consensus statement: Guidelines for Management of Small  Pulmonary Nodules Detected on CT Scans: A Statement from the Fleischner Society as published in Radiology 2005; 237:395-400. 5. Coronary artery calcifications. 6. 4 cm ascending thoracic aorta. Recommend annual imaging followup by CTA or MRA. This recommendation follows 2010 ACCF/AHA/AATS/ACR/ASA/SCA/SCAI/SIR/STS/SVM Guidelines for the Diagnosis and Management of Patients with Thoracic Aortic Disease. Circulation. 2010; 121: W295-A213. Aortic aneurysm NOS (ICD10-I71.9) 7. Aortic Atherosclerosis (ICD10-I70.0) and Emphysema (ICD10-J43.9). Electronically Signed   By: Burtis Case.D.  On: 03/21/2024 06:44   DG Chest Portable 1 View Result Date: 03/21/2024 CLINICAL DATA:  Shortness of breath and hypoxia.  Chest pain. EXAM: PORTABLE CHEST 1 VIEW COMPARISON:  06/11/2023. FINDINGS: The heart size and mediastinal contours are within normal limits. Faint right upper lobe opacity is noted concerning for early pneumonia. Left lung appears clear. The visualized osseous structures are intact. The visualized skeletal structures are unremarkable. IMPRESSION: Faint right upper lobe opacity concerning for early pneumonia. Electronically Signed   By: Kimberley Penman M.D.   On: 03/21/2024 06:34    Labs:  CBC: Recent Labs    03/21/24 0350 03/22/24 0417 03/23/24 0400 03/24/24 0410  WBC 30.0* 32.7* 40.6* 45.2*  HGB 11.1* 10.8* 10.7* 10.8*  HCT 34.4* 33.1* 32.7* 33.9*  PLT 147* 170 164 168    COAGS: Recent Labs    03/21/24 0349  INR 1.2    BMP: Recent Labs    06/11/23 0647 03/21/24 0350 03/22/24 0417 03/23/24 0400  NA 137 138 138 136  K 4.3 3.9 3.7 3.6  CL 102 105 105 104  CO2 24 25 23 25   GLUCOSE 112* 103* 105* 104*  BUN 18 21 15 11   CALCIUM 9.2 8.8* 8.5* 8.6*  CREATININE 0.91 1.10 0.82 0.95  GFRNONAA >60 >60 >60 >60    LIVER FUNCTION TESTS: Recent Labs    03/22/24 0417  BILITOT 0.7  AST 32  ALT 30  ALKPHOS 58  PROT 7.5  ALBUMIN 3.3*    TUMOR MARKERS: No results for input(s):  "AFPTM", "CEA", "CA199", "CHROMGRNA" in the last 8760 hours.  Assessment and Plan:  Sumpter Kice is a 89 y.o. male with pmhx of COPD, history of chronic hypoxic respiratory failure no longer on oxygen, history of PE, history of SVT. Currently in the hospital after presenting for chest pain and SOB - was found to be hypoxic with elevated white count and concern for pneumonia on imaging. From patients CBC with peripheral smear, severe leukocytosis was found along with immature monocytes. Pathologist had informed the hospitalist findings were concerning for acute leukemia and IR has been now consulted to perform image guided bone marrow biopsy.  Plan for procedure tomorrow, 03/25/24. Pt to be NPO at midnight.    Risks and benefits of image guided bone marrow biopsy was discussed with the patient and/or patient's family including, but not limited to bleeding, infection, damage to adjacent structures or low yield requiring additional tests.  All of the questions were answered and there is agreement to proceed.  Consent signed and in chart.   Thank you for allowing our service to participate in Wisam Fahnestock 's care.  Electronically Signed: Nicolasa Barrett, PA-C   03/24/2024, 10:06 AM      I spent a total of 20 Minutes    in face to face in clinical consultation, greater than 50% of which was counseling/coordinating care for image guided bone marrow biopsy.

## 2024-03-24 NOTE — Plan of Care (Signed)
  Problem: Health Behavior/Discharge Planning: Goal: Ability to manage health-related needs will improve Outcome: Progressing   Problem: Clinical Measurements: Goal: Cardiovascular complication will be avoided Outcome: Progressing   Problem: Nutrition: Goal: Adequate nutrition will be maintained Outcome: Progressing   Problem: Pain Managment: Goal: General experience of comfort will improve and/or be controlled Outcome: Progressing   Problem: Respiratory: Goal: Ability to maintain adequate ventilation will improve Outcome: Progressing

## 2024-03-24 NOTE — Progress Notes (Signed)
 Progress Note    Dylan Weber  YQI:[id number] DOB: 1933-10-07  DOA: 03/21/2024 PCP: Will Hare, NP      Brief Narrative:    Medical records reviewed and are as summarized below:  Dylan Weber is a 89 y.o. male with medical history significant for COPD, history of chronic hypoxic respiratory failure no longer on oxygen, history of PE, history of SVT, who presented to the hospital because of nausea, intermittent chest pain and shortness of breath.  Patient was found to be hypoxic  with oxygen saturation in the 80s by EMS.  WBC 30 and CT chest showed  right upper lobe pneumonia.       Assessment/Plan:   Principal Problem:   Acute respiratory failure with hypoxia (HCC) Active Problems:   SVT (supraventricular tachycardia) (HCC)   COPD (chronic obstructive pulmonary disease) (HCC)   Pulmonary embolism (HCC)   CAD (coronary artery disease)   Leukocytosis   Body mass index is 22.15 kg/m.   Sepsis secondary to community-acquired pneumonia leukocytosis: WBC up from 30-32-40.  Continue empiric IV antibiotics. No growth on blood cultures.  Strep pneumo and Legionella urine antigen were negative. Dizziness: Orthostatic vital signs negative   Acute hypoxic respiratory failure: He is still requiring 2 L oxygen. Wean off oxygen as able.  He may need home oxygen given underlying COPD. History of chronic hypoxic respiratory failure but no longer on oxygen   COPD: Stable.  Continue bronchodilators.   Severe leukocytosis, monocyte predominant:  WBC up from 30-32-4045.2.   Peripheral blood smear showed immature monocytes (monoblasts and promonocytes).  He was evaluated by Dr. Valentine Gasmen, oncologist.  Plan for bone marrow biopsy tomorrow.  Flow cytometry is pending.   CAD, history of SVT: Continue bisoprolol    4 mm right lower lobe nodule: Recommended outpatient follow-up with CT chest in 1 year given his history of tobacco use disorder.  Initially quit smoking  about 10 years ago.   Recent left wrist injury with cast on left arm: X-ray on 03/12/2024 did not show any fracture or dislocation but there was evidence of moderate to severe carpometacarpal degenerative disease.  He was evaluated by Dr. Artie Bimler on 03/22/2024.  He agreed to follow-up with orthopedic surgeon to have cast removed.    Mild cognitive impairment/minor neurocognitive disorder: His wife.  Patient has had memory issues and word finding difficulty for almost 2 years now and she thinks that symptoms have gotten worse in the past 6 months.   History of pulmonary embolism in January 2023.  No longer on anticoagulation.      Diet Order             Diet NPO time specified Except for: Sips with Meds  Diet effective midnight           Diet regular Room service appropriate? Yes; Fluid consistency: Thin  Diet effective now                            Consultants: Orthopedic surgeon Oncologist Interventional radiologist  Procedures: None    Medications:    bisoprolol   2.5 mg Oral Daily   enoxaparin  (LOVENOX ) injection  40 mg Subcutaneous Q24H   feeding supplement  237 mL Oral BID BM   pantoprazole   20 mg Oral Daily   Continuous Infusions:  azithromycin  500 mg (03/24/24 0640)   cefTRIAXone (ROCEPHIN)  IV 2 g (03/24/24 0607)     Anti-infectives (From admission, onward)  Start     Dose/Rate Route Frequency Ordered Stop   03/22/24 0500  cefTRIAXone (ROCEPHIN) 2 g in sodium chloride  0.9 % 100 mL IVPB        2 g 200 mL/hr over 30 Minutes Intravenous Every 24 hours 03/21/24 0801 03/27/24 0459   03/22/24 0500  azithromycin  (ZITHROMAX ) 500 mg in sodium chloride  0.9 % 250 mL IVPB        500 mg 250 mL/hr over 60 Minutes Intravenous Every 24 hours 03/21/24 0801 03/27/24 0459   03/21/24 0715  cefTRIAXone (ROCEPHIN) 1 g in sodium chloride  0.9 % 100 mL IVPB        1 g 200 mL/hr over 30 Minutes Intravenous  Once 03/21/24 0703 03/21/24 0744   03/21/24 0715   azithromycin  (ZITHROMAX ) 500 mg in sodium chloride  0.9 % 250 mL IVPB        500 mg 250 mL/hr over 60 Minutes Intravenous  Once 03/21/24 0703 03/21/24 0820              Family Communication/Anticipated D/C date and plan/Code Status   DVT prophylaxis: enoxaparin  (LOVENOX ) injection 40 mg Start: 03/21/24 2200     Code Status: Full Code  Family Communication: None Disposition Plan: Plan to discharge home   Status is: Inpatient Remains inpatient appropriate because: Pneumonia, severe leukocytosis         Subjective:   Interval events noted.  No complaints.  He feels better.  Objective:    Vitals:   03/23/24 1146 03/23/24 2019 03/24/24 0518 03/24/24 0823  BP: 102/62 115/66 123/82 111/72  Pulse:  74 71 87  Resp:  16 18   Temp: 98.4 F (36.9 C) 99.5 F (37.5 C) 98.4 F (36.9 C) 97.7 F (36.5 C)  TempSrc:   Oral   SpO2:  93% 95% 97%  Weight:      Height:       No data found.    Intake/Output Summary (Last 24 hours) at 03/24/2024 1424 Last data filed at 03/24/2024 0900 Gross per 24 hour  Intake 120 ml  Output --  Net 120 ml   Filed Weights   03/21/24 0345  Weight: 68 kg    Exam:   GEN: NAD SKIN: Warm and dry EYES: No pallor or icterus ENT: MMM CV: RRR PULM: CTA B ABD: soft, ND, NT, +BS CNS: AAO x 3, non focal EXT: No edema or tenderness.  Cast on left forearm    Data Reviewed:   I have personally reviewed following labs and imaging studies:  Labs: Labs show the following:   Basic Metabolic Panel: Recent Labs  Lab 03/21/24 0350 03/22/24 0417 03/23/24 0400  NA 138 138 136  K 3.9 3.7 3.6  CL 105 105 104  CO2 25 23 25   GLUCOSE 103* 105* 104*  BUN 21 15 11   CREATININE 1.10 0.82 0.95  CALCIUM 8.8* 8.5* 8.6*   GFR Estimated Creatinine Clearance: 51.7 mL/min (by C-G formula based on SCr of 0.95 mg/dL). Liver Function Tests: Recent Labs  Lab 03/22/24 0417  AST 32  ALT 30  ALKPHOS 58  BILITOT 0.7  PROT 7.5  ALBUMIN 3.3*    No results for input(s): "LIPASE", "AMYLASE" in the last 168 hours. No results for input(s): "AMMONIA" in the last 168 hours. Coagulation profile Recent Labs  Lab 03/21/24 0349 03/24/24 1021  INR 1.2 1.3*    CBC: Recent Labs  Lab 03/21/24 0350 03/22/24 0417 03/23/24 0400 03/24/24 0410  WBC 30.0* 32.7* 40.6* 45.2*  NEUTROABS  --   --  7.3 7.4  HGB 11.1* 10.8* 10.7* 10.8*  HCT 34.4* 33.1* 32.7* 33.9*  MCV 98.3 96.8 96.7 98.5  PLT 147* 170 164 168   Cardiac Enzymes: No results for input(s): "CKTOTAL", "CKMB", "CKMBINDEX", "TROPONINI" in the last 168 hours. BNP (last 3 results) No results for input(s): "PROBNP" in the last 8760 hours. CBG: No results for input(s): "GLUCAP" in the last 168 hours. D-Dimer: No results for input(s): "DDIMER" in the last 72 hours. Hgb A1c: No results for input(s): "HGBA1C" in the last 72 hours. Lipid Profile: No results for input(s): "CHOL", "HDL", "LDLCALC", "TRIG", "CHOLHDL", "LDLDIRECT" in the last 72 hours. Thyroid  function studies: No results for input(s): "TSH", "T4TOTAL", "T3FREE", "THYROIDAB" in the last 72 hours.  Invalid input(s): "FREET3" Anemia work up: No results for input(s): "VITAMINB12", "FOLATE", "FERRITIN", "TIBC", "IRON", "RETICCTPCT" in the last 72 hours. Sepsis Labs: Recent Labs  Lab 03/21/24 0350 03/21/24 0418 03/22/24 0417 03/23/24 0400 03/24/24 0410  WBC 30.0*  --  32.7* 40.6* 45.2*  LATICACIDVEN  --  0.7  --   --   --     Microbiology Recent Results (from the past 240 hours)  Blood Culture (routine x 2)     Status: None (Preliminary result)   Collection Time: 03/21/24  4:18 AM   Specimen: BLOOD  Result Value Ref Range Status   Specimen Description BLOOD BLOOD RIGHT ARM  Final   Special Requests   Final    BOTTLES DRAWN AEROBIC AND ANAEROBIC Blood Culture results may not be optimal due to an inadequate volume of blood received in culture bottles   Culture   Final    NO GROWTH 3 DAYS Performed at  [facility], [address] [place], [place] [address]    Report Status PENDING  Incomplete  Blood Culture (routine x 2)     Status: None (Preliminary result)   Collection Time: 03/21/24  4:18 AM   Specimen: BLOOD  Result Value Ref Range Status   Specimen Description BLOOD RH  Final   Special Requests   Final    BOTTLES DRAWN AEROBIC AND ANAEROBIC Blood Culture results may not be optimal due to an inadequate volume of blood received in culture bottles   Culture   Final    NO GROWTH 3 DAYS Performed at [facility], [address] [place], [place] [address]    Report Status PENDING  Incomplete  Respiratory (~20 pathogens) panel by PCR     Status: None   Collection Time: 03/21/24  8:24 AM   Specimen: Nasopharyngeal Swab; Respiratory  Result Value Ref Range Status   Adenovirus NOT DETECTED NOT DETECTED Final   Coronavirus 229E NOT DETECTED NOT DETECTED Final    Comment: (NOTE) The Coronavirus on the Respiratory Panel, DOES NOT test for the novel  Coronavirus (2019 nCoV)    Coronavirus HKU1 NOT DETECTED NOT DETECTED Final   Coronavirus NL63 NOT DETECTED NOT DETECTED Final   Coronavirus OC43 NOT DETECTED NOT DETECTED Final   Metapneumovirus NOT DETECTED NOT DETECTED Final   Rhinovirus / Enterovirus NOT DETECTED NOT DETECTED Final   Influenza A NOT DETECTED NOT DETECTED Final   Influenza B NOT DETECTED NOT DETECTED Final   Parainfluenza Virus 1 NOT DETECTED NOT DETECTED Final   Parainfluenza Virus 2 NOT DETECTED NOT DETECTED Final   Parainfluenza Virus 3 NOT DETECTED NOT DETECTED Final   Parainfluenza Virus 4 NOT DETECTED NOT DETECTED Final   Respiratory Syncytial Virus NOT  DETECTED NOT DETECTED Final   Bordetella pertussis NOT DETECTED NOT DETECTED Final   Bordetella Parapertussis NOT DETECTED NOT DETECTED Final   Chlamydophila pneumoniae NOT DETECTED NOT DETECTED Final   Mycoplasma pneumoniae NOT DETECTED NOT DETECTED Final    Comment: Performed at [facility], [address], [place], [place] [address]    Procedures and diagnostic studies:  No results found.              LOS: 2 days   Tyquan Carmickle  [facility]   Pager on [email or web address]. If 7PM-7AM, please contact night-coverage at [email or web address]     03/24/2024, 2:24 PM

## 2024-03-24 NOTE — Consult Note (Signed)
 [facility] CONSULT NOTE  Patient Care Team: Fields, Daylene Evangelist, NP as PCP - General (Family Medicine) Jerelene Monday Deadra Everts, MD as PCP - Cardiology (Cardiology)  CHIEF COMPLAINTS/PURPOSE OF CONSULTATION: Leucocytosis   HISTORY OF PRESENTING ILLNESS:  Dylan Weber 89 y.o.  male pleasant patient with a history of COPD-is currently admitted to hospital for worsening shortness of breath and cough.  CT scan concerning for pneumonia- no evidence of PE. Patient on antibiotics.  On admission patient had to have leukocytosis-between 30-40,000; normal hemoglobin and platelets.  Review of smear concerning for blast.   Hematology has been consulted for the evaluation.  Of note patient complaining of-left arm pain from his cast.  He had sustained a fracture post fall Weeks ago.  Review of Systems  Constitutional:  Positive for malaise/fatigue and weight loss. Negative for chills, diaphoresis and fever.  HENT:  Negative for nosebleeds and sore throat.   Eyes:  Negative for double vision.  Respiratory:  Positive for sputum production and shortness of breath. Negative for cough, hemoptysis and wheezing.   Cardiovascular:  Negative for chest pain, palpitations, orthopnea and leg swelling.  Gastrointestinal:  Negative for abdominal pain, blood in stool, constipation, diarrhea, heartburn, melena, nausea and vomiting.  Genitourinary:  Negative for dysuria, frequency and urgency.  Musculoskeletal:  Positive for joint pain. Negative for back pain.  Skin: Negative.  Negative for itching and rash.  Neurological:  Negative for dizziness, tingling, focal weakness, weakness and headaches.  Endo/Heme/Allergies:  Does not bruise/bleed easily.  Psychiatric/Behavioral:  Negative for depression. The patient is not nervous/anxious and does not have insomnia.     MEDICAL HISTORY:  Past Medical History:  Diagnosis Date   Basal cell carcinoma 06/19/2022   Dorsum nose. Nodular. EDC 07/17/2022   BCC (basal  cell carcinoma) 10/11/2022   right nasal supratip, superficial and nodular, EDC 12/19/22   COPD (chronic obstructive pulmonary disease) (HCC)    Coronary artery calcification seen on CT scan 11/17/2021   COVID-19 10/2021   Diastolic dysfunction    a. 11/2021 Echo: EF 55-60%, no rwma, GrI DD, nl RV fxn. Mild AI.   GERD (gastroesophageal reflux disease)    Pulmonary embolism (HCC)    a. 11/2021 CTA Chest: small peripheral PE in RLL branch of PA-->Eliquis .   Right leg DVT (HCC)    a. 11/2021 LE U/S: occlusive R calf vein DVT in R peroneal vein-->eliquis .   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 [facility] 07/17/2022   SVT (supraventricular tachycardia) (HCC)    a. Dx 10/2021 in setting of COVID infxn.    SURGICAL HISTORY: Past Surgical History:  Procedure Laterality Date   APPENDECTOMY     CHOLECYSTECTOMY     EYE SURGERY      SOCIAL HISTORY: Social History   Socioeconomic History   Marital status: Married    Spouse name: Not on file   Number of children: Not on file   Years of education: Not on file   Highest education level: Not on file  Occupational History   Not on file  Tobacco Use   Smoking status: Former    Types: Cigarettes   Smokeless tobacco: Former  Advertising account planner   Vaping status: Never Used  Substance and Sexual Activity   Alcohol use: Yes    Alcohol/week: 7.0 standard drinks of alcohol    Types: 7 Shots of liquor per  week   Drug use: Never   Sexual activity: Not Currently  Other Topics Concern   Not on file  Social History Narrative   Not on file   Social Drivers of Health   Financial Resource Strain: Low Risk  (12/02/2023)   Received from [facility]   Overall Financial Resource Strain (CARDIA)    Difficulty of Paying Living Expenses: Not hard at all  Food Insecurity: No Food Insecurity (03/21/2024)   Hunger Vital Sign    Worried  About Running Out of Food in the Last Year: Never true    Ran Out of Food in the Last Year: Never true  Transportation Needs: No Transportation Needs (03/21/2024)   PRAPARE - Administrator, Civil Service (Medical): No    Lack of Transportation (Non-Medical): No  Physical Activity: Not on file  Stress: Not on file  Social Connections: Unknown (03/21/2024)   Social Connection and Isolation Panel [NHANES]    Frequency of Communication with Friends and Family: Once a week    Frequency of Social Gatherings with Friends and Family: Once a week    Attends Religious Services: Not on Marketing executive or Organizations: No    Attends Banker Meetings: Not on file    Marital Status: Married  Intimate Partner Violence: Not At Risk (03/21/2024)   Humiliation, Afraid, Rape, and Kick questionnaire    Fear of Current or Ex-Partner: No    Emotionally Abused: No    Physically Abused: No    Sexually Abused: No    FAMILY HISTORY: Family History  Problem Relation Age of Onset   Uterine cancer Mother    Brain cancer Sister     ALLERGIES:  is allergic to pregabalin, diltiazem , prednisone , meloxicam, amoxicillin, and sulfamethoxazole-trimethoprim.  MEDICATIONS:  Current Facility-Administered Medications  Medication Dose Route Frequency Provider Last Rate Last Admin   acetaminophen  (TYLENOL ) tablet 650 mg  650 mg Oral Q6H PRN Merrill, Kristin A, RPH   650 mg at 03/24/24 1125   acetaminophen -codeine (TYLENOL  #3) 300-30 MG per tablet 1 tablet  1 tablet Oral Q8H PRN Sheril Dines, MD       azithromycin  (ZITHROMAX ) 500 mg in sodium chloride  0.9 % 250 mL IVPB  500 mg Intravenous Q24H Newton, Steven J, MD 250 mL/hr at 03/24/24 0640 500 mg at 03/24/24 0640   bisoprolol  (ZEBETA ) tablet 2.5 mg  2.5 mg Oral Daily Sheril Dines, MD   2.5 mg at 03/24/24 1124   cefTRIAXone (ROCEPHIN) 2 g in sodium chloride  0.9 % 100 mL IVPB  2 g Intravenous Q24H Newton, Steven J, MD 200 mL/hr  at 03/24/24 0607 2 g at 03/24/24 6045   enoxaparin  (LOVENOX ) injection 40 mg  40 mg Subcutaneous Q24H Corrinne Din, MD   40 mg at 03/24/24 2233   feeding supplement (ENSURE ENLIVE / ENSURE PLUS) liquid 237 mL  237 mL Oral BID BM Sheril Dines, MD   237 mL at 03/24/24 1123   levalbuterol (XOPENEX) nebulizer solution 0.63 mg  0.63 mg Nebulization Q6H PRN Newton, Steven J, MD       melatonin tablet 5 mg  5 mg Oral QHS PRN Duncan, Hazel V, MD   5 mg at 03/23/24 2059   ondansetron  (ZOFRAN ) tablet 4 mg  4 mg Oral Q6H PRN Corrinne Din, MD       Or   ondansetron  (ZOFRAN ) injection 4 mg  4 mg Intravenous Q6H PRN Newton, Steven J,  MD       pantoprazole  (PROTONIX ) EC tablet 20 mg  20 mg Oral Daily Sheril Dines, MD   20 mg at 03/24/24 1125    PHYSICAL EXAMINATION:   Vitals:   03/24/24 1549 03/24/24 1952  BP: (!) 89/64 107/63  Pulse: (!) 58 66  Resp: 16 16  Temp: 98.2 F (36.8 C) 98.4 F (36.9 C)  SpO2: 98% 96%   Filed Weights   03/21/24 0345  Weight: 150 lb (68 kg)   On [place]  lit.   Physical Exam Vitals and nursing note reviewed.  HENT:     Head: Normocephalic and atraumatic.     Mouth/Throat:     Pharynx: Oropharynx is clear.  Eyes:     Extraocular Movements: Extraocular movements intact.     Pupils: Pupils are equal, round, and reactive to light.  Cardiovascular:     Rate and Rhythm: Normal rate and regular rhythm.  Pulmonary:     Comments: Decreased breath sounds bilaterally.  Abdominal:     Palpations: Abdomen is soft.  Musculoskeletal:        General: Normal range of motion.     Cervical back: Normal range of motion.  Skin:    General: Skin is warm.  Neurological:     General: No focal deficit present.     Mental Status: He is alert and oriented to person, place, and time.  Psychiatric:        Behavior: Behavior normal.        Judgment: Judgment normal.     LABORATORY DATA:  I have reviewed the data as listed Lab Results  Component Value Date   WBC  45.2 (H) 03/24/2024   HGB 10.8 (L) 03/24/2024   HCT 33.9 (L) 03/24/2024   MCV 98.5 03/24/2024   PLT 168 03/24/2024   Recent Labs    03/21/24 0350 03/22/24 0417 03/23/24 0400  NA 138 138 136  K 3.9 3.7 3.6  CL 105 105 104  CO2 25 23 25   GLUCOSE 103* 105* 104*  BUN 21 15 11   CREATININE 1.10 0.82 0.95  CALCIUM 8.8* 8.5* 8.6*  GFRNONAA >60 >60 >60  PROT  --  7.5  --   ALBUMIN  --  3.3*  --   AST  --  32  --   ALT  --  30  --   ALKPHOS  --  58  --   BILITOT  --  0.7  --     RADIOGRAPHIC STUDIES: I have personally reviewed the radiological images as listed and agreed with the findings in the report. CT Angio Chest PE W/Cm &/Or Wo Cm Result Date: 03/21/2024 CLINICAL DATA:  Evaluate for acute pulmonary embolism. EXAM: CT ANGIOGRAPHY CHEST WITH CONTRAST TECHNIQUE: Multidetector CT imaging of the chest was performed using the standard protocol during bolus administration of intravenous contrast. Multiplanar CT image reconstructions and MIPs were obtained to evaluate the vascular anatomy. RADIATION DOSE REDUCTION: This exam was performed according to the departmental dose-optimization program which includes automated exposure control, adjustment of the mA and/or kV according to patient size and/or use of iterative reconstruction technique. CONTRAST:  75mL OMNIPAQUE  IOHEXOL  350 MG/ML SOLN COMPARISON:  06/11/2023 FINDINGS: Cardiovascular: Satisfactory opacification of the pulmonary arteries to the segmental level. No evidence of pulmonary embolism. Normal heart size. No pericardial effusion. Aortic atherosclerosis. The ascending thoracic aorta measures 4 cm, image 43/7. Coronary artery calcifications. Mediastinum/Nodes: Thyroid  gland, trachea, and esophagus appear normal. No mediastinal adenopathy. Right hilar node measures  1.5 cm, image 61/4. Lungs/Pleura: Emphysema with diffuse bronchial wall thickening. Within the anterior right upper lobe there is a patchy area of ground-glass and airspace  density, image 54/5. Right upper lobe lung nodule measures 5 mm, image 38/5. Unchanged from the previous exam. This is been stable since 12/18/2022 compatible with a benign nodule. Peripheral nodule within the right upper lobe measuring 2 mm is also unchanged from 12/28/2022. 4 mm nodule in the right lower lobe appears new, image 78/5. Upper Abdomen: No acute abnormality.  Cholecystectomy. Musculoskeletal: Thoracic degenerative disc disease. No acute or suspicious osseous findings. Review of the MIP images confirms the above findings. IMPRESSION: 1. No evidence for acute pulmonary embolism. 2. Patchy area of ground-glass and airspace density within the anterior right upper lobe is favored to represent an area of pneumonia. Recommend follow-up imaging to ensure resolution and to rule out underlying malignancy. 3. Right hilar lymphadenopathy is favored to be reactive. 4. New 4 mm nodule in the right lower lobe. If the patient is at high risk for bronchogenic carcinoma, follow-up chest CT at 1year is recommended. If the patient is at low risk, no follow-up is needed. This recommendation follows the consensus statement: Guidelines for Management of Small Pulmonary Nodules Detected on CT Scans: A Statement from the Fleischner Society as published in Radiology 2005; 237:395-400. 5. Coronary artery calcifications. 6. 4 cm ascending thoracic aorta. Recommend annual imaging followup by CTA or MRA. This recommendation follows 2010 ACCF/AHA/AATS/ACR/ASA/SCA/SCAI/SIR/STS/SVM Guidelines for the Diagnosis and Management of Patients with Thoracic Aortic Disease. Circulation. 2010; 121: Z610-R604. Aortic aneurysm NOS (ICD10-I71.9) 7. Aortic Atherosclerosis (ICD10-I70.0) and Emphysema (ICD10-J43.9). Electronically Signed   By: Kimberley Penman M.D.   On: 03/21/2024 06:44   DG Chest Portable 1 View Result Date: 03/21/2024 CLINICAL DATA:  Shortness of breath and hypoxia.  Chest pain. EXAM: PORTABLE CHEST 1 VIEW COMPARISON:   06/11/2023. FINDINGS: The heart size and mediastinal contours are within normal limits. Faint right upper lobe opacity is noted concerning for early pneumonia. Left lung appears clear. The visualized osseous structures are intact. The visualized skeletal structures are unremarkable. IMPRESSION: Faint right upper lobe opacity concerning for early pneumonia. Electronically Signed   By: Kimberley Penman M.D.   On: 03/21/2024 60:36   # 89 year old Caucasian male patient currently admitted to hospital for pneumonia noted to have significant elevated leukocytosis.   # Leukocytosis platelets 40,000-normal hemoglobin/platelets-review of smear concerning for blast.  Less likely reactive/leukemoid reaction  # Pneumonia-continue antibiotics.  # Left arm fracture status post casting  # History of SVT/remote history of PE-not on anticoagulation  Recommendations/plan:  # Given the concern for blast noted on peripheral smear-ordered peripheral blood flow cytometry.  # Check LDH coagulation profile; fibrinogen.  # Discussed regarding use of peripheral blood NGS.  Also discussed bone marrow biopsy-for further evaluation and workup.  Ordered NPO.  Thank you Dr.Ayiku for allowing me to participate in the care of your pleasant patient. Please do not hesitate to contact me with questions or concerns in the interim.  Plan of care was discussed with the patient/and wife over the phone.  Also discussed with primary hospitalist team.     Jasani Lengel R Delise Simenson, MD 03/24/2024 10:48 PM

## 2024-03-24 NOTE — Progress Notes (Signed)
 PT Cancellation Note  Patient Details Name: Dylan Weber MRN: [id number] DOB: Jun 22, 1933   Cancelled Treatment:    Reason Eval/Treat Not Completed: Other (comment)  Pt in bed.  Declined session stated he has been up to bathroom and feels comfortable with mobility.  Will return at a later time/date to encourage mobiltiy.   Charlanne Cong 03/24/2024, 9:14 AM

## 2024-03-25 ENCOUNTER — Encounter: Payer: Self-pay | Admitting: Family Medicine

## 2024-03-25 ENCOUNTER — Other Ambulatory Visit: Payer: Self-pay

## 2024-03-25 ENCOUNTER — Inpatient Hospital Stay: Admitting: Radiology

## 2024-03-25 ENCOUNTER — Other Ambulatory Visit: Admitting: Radiology

## 2024-03-25 DIAGNOSIS — J449 Chronic obstructive pulmonary disease, unspecified: Secondary | ICD-10-CM | POA: Diagnosis not present

## 2024-03-25 DIAGNOSIS — I25118 Atherosclerotic heart disease of native coronary artery with other forms of angina pectoris: Secondary | ICD-10-CM

## 2024-03-25 DIAGNOSIS — I2782 Chronic pulmonary embolism: Secondary | ICD-10-CM

## 2024-03-25 DIAGNOSIS — D72829 Elevated white blood cell count, unspecified: Secondary | ICD-10-CM | POA: Diagnosis not present

## 2024-03-25 DIAGNOSIS — I471 Supraventricular tachycardia, unspecified: Secondary | ICD-10-CM

## 2024-03-25 DIAGNOSIS — J9601 Acute respiratory failure with hypoxia: Secondary | ICD-10-CM | POA: Diagnosis not present

## 2024-03-25 LAB — COMP PANEL: LEUKEMIA/LYMPHOMA

## 2024-03-25 LAB — CBC WITH DIFFERENTIAL/PLATELET
Abs Immature Granulocytes: 2.19 10*3/uL — ABNORMAL HIGH (ref 0.00–0.07)
Basophils Absolute: 0.3 10*3/uL — ABNORMAL HIGH (ref 0.0–0.1)
Basophils Relative: 1 %
Eosinophils Absolute: 0 10*3/uL (ref 0.0–0.5)
Eosinophils Relative: 0 %
HCT: 32.6 % — ABNORMAL LOW (ref 39.0–52.0)
Hemoglobin: 10.5 g/dL — ABNORMAL LOW (ref 13.0–17.0)
Immature Granulocytes: 6 %
Lymphocytes Relative: 15 %
Lymphs Abs: 6.1 10*3/uL — ABNORMAL HIGH (ref 0.7–4.0)
MCH: 31.4 pg (ref 26.0–34.0)
MCHC: 32.2 g/dL (ref 30.0–36.0)
MCV: 97.6 fL (ref 80.0–100.0)
Monocytes Absolute: 23.8 10*3/uL — ABNORMAL HIGH (ref 0.1–1.0)
Monocytes Relative: 59 %
Neutro Abs: 7.7 10*3/uL (ref 1.7–7.7)
Neutrophils Relative %: 19 %
Platelets: 155 10*3/uL (ref 150–400)
RBC: 3.34 MIL/uL — ABNORMAL LOW (ref 4.22–5.81)
RDW: 15.9 % — ABNORMAL HIGH (ref 11.5–15.5)
Smear Review: NORMAL
WBC: 40 10*3/uL — ABNORMAL HIGH (ref 4.0–10.5)
nRBC: 0.6 % — ABNORMAL HIGH (ref 0.0–0.2)

## 2024-03-25 NOTE — Progress Notes (Signed)
 Procedure cancelled at this time per IR PA.  Notified 1C RN and patient to be transported back to inpatient room.

## 2024-03-25 NOTE — Plan of Care (Signed)
  Problem: Health Behavior/Discharge Planning: Goal: Ability to manage health-related needs will improve Outcome: Progressing   Problem: Nutrition: Goal: Adequate nutrition will be maintained Outcome: Progressing   Problem: Safety: Goal: Ability to remain free from injury will improve Outcome: Progressing   Problem: Respiratory: Goal: Ability to maintain adequate ventilation will improve Outcome: Progressing

## 2024-03-25 NOTE — Care Management Important Message (Signed)
 Important Message  Patient Details  Name: Dylan Weber MRN: [id number] Date of Birth: 1933-02-15   Important Message Given:  Yes - Medicare IM     Madgie Dhaliwal W, CMA 03/25/2024, 9:18 AM

## 2024-03-25 NOTE — Progress Notes (Addendum)
 Patient refused bone marrow biopsy this am, stated he wants the cast removed from his arm and will not do the other procedure until its removed. I have tried to educate the patient on the differences between biopsy and cast. And how different doctors determine whether the cast can be removed or not. He stated he didn't care, doctors are doctors and they can do it if they want to. Patient is alert and oriented but is not understanding or refusing to understand the differences. He has not eaten this am but I have witnessed patient taking a few sips of water this am

## 2024-03-25 NOTE — Progress Notes (Signed)
 SATURATION QUALIFICATIONS: (This note is used to comply with regulatory documentation for home oxygen)  Patient Saturations on Room Air at Rest = 90%  Patient Saturations on Room Air while Ambulating = 76%  Patient Saturations on 2 Liters of oxygen while Ambulating = 97%  Please briefly explain why patient needs home oxygen: Patient oxygen level drops dramatically on room air while ambulating

## 2024-03-25 NOTE — Progress Notes (Signed)
 Brief Interventional Radiology Note:   Paged to [facility] to discuss bone marrow biopsy with patient this morning as he was refusing to move forward with the procedure unless we removed his cast. Discussed with the patient that we were not able to remove his cast as we are not the team managing it. Patient subsequently refused to move forward with the procedure stating that he would only agree to the bone marrow biopsy if we cut his cast off. I offered to reach out to the Orthopedic team which did not alleviate any of the patient's frustration. Informed the patient that we would send the him  back to his room while I reached out to the ordering provider, Dr. Arron Big, to see if we could perform the bone marrow biopsy as an outpatient. Patient was agreeable to this plan and was sent back to his room.  After discussion with Dr. Valentine Gasmen (Heme/Onc) and Dr. Dorette Garb [facility]), the patient states that he is willing to move forward with the bone marrow biopsy and will follow up with Ortho as an outpatient. Unfortunately, the patient has already eaten some breakfast. Patient and team made aware that we are not be able to use moderate sedation because of this. Informed them that we can plan for Versed and local lidocaine  if they wish to proceed with procedure today. Patient and team are all agreeable to this plan.   Will plan for bone marrow biopsy on 5/14 with Dr. Baldemar Lev with Versed and Lidocaine  only  Risks and benefits of bone marrow biopsy was discussed with the patient and/or patient's family including, but not limited to bleeding, infection, damage to adjacent structures or low yield requiring additional tests.  All of the questions were answered and there is agreement to proceed.  Consent signed and in IR.  Electronically Signed: Montgomery Rothlisberger M Hilde Churchman, PA-C 03/25/2024, 12:44 PM

## 2024-03-25 NOTE — Progress Notes (Signed)
 Progress Note    Dylan Weber  ZHY:[id number] DOB: 10/13/1933  DOA: 03/21/2024 PCP: Will Hare, NP      Brief Narrative:    Medical records reviewed and are as summarized below:  Dylan Weber is a 89 y.o. male with medical history significant for COPD, history of chronic hypoxic respiratory failure no longer on oxygen, history of PE, history of SVT, who presented to the hospital because of nausea, intermittent chest pain and shortness of breath.  Patient was found to be hypoxic  with oxygen saturation in the 80s by EMS.  WBC 30 and CT chest showed  right upper lobe pneumonia.  5/14: Bone marrow biopsy got postponed until tomorrow morning as he initially refused as he was thinking that his left upper extremity cast will be removed before biopsy.  Explained everything to him and wife and he will follow-up with his orthopedic surgeon as outpatient for cast removal.  Dr. Valentine Gasmen from oncology really wants him to have biopsy during current hospitalization due to concern of aggressive form of leukemia based on peripheral smear results. Patient wants to go home after the biopsy tomorrow morning  Assessment/Plan:   Principal Problem:   Acute respiratory failure with hypoxia (HCC) Active Problems:   SVT (supraventricular tachycardia) (HCC)   COPD (chronic obstructive pulmonary disease) (HCC)   Pulmonary embolism (HCC)   CAD (coronary artery disease)   Leukocytosis   Body mass index is 22.15 kg/m.   Sepsis secondary to community-acquired pneumonia leukocytosis: WBC up from 30-32-40.   -Continue ceftriaxone and Zithromax -will complete 5-day course No growth on blood cultures.  Strep pneumo and Legionella urine antigen were negative.  Dizziness: Orthostatic vital signs negative.  Symptoms improved  Acute hypoxic respiratory failure: Able to wean back to room air now. History of chronic hypoxic respiratory failure but no longer on oxygen  COPD: Stable.  Continue  bronchodilators.  Severe leukocytosis, monocyte predominant:  WBC up from 30-32-40-45.2.   Peripheral blood smear showed immature monocytes (monoblasts and promonocytes).  He was evaluated by Dr. Valentine Gasmen, oncologist.  Plan for bone marrow biopsy tomorrow.  Flow cytometry is pending. 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  Initially quit smoking about 10 years ago.  Recent left wrist injury with cast on left arm: X-ray on 03/12/2024 did not show any fracture or dislocation but there was evidence of moderate to severe carpometacarpal degenerative disease.  He was evaluated by Dr. Artie Bimler on 03/22/2024.  He agreed to follow-up with orthopedic surgeon to have cast removed.  - Outpatient follow-up with orthopedic surgery for removal of cast  Mild cognitive impairment/minor neurocognitive disorder: Per wife patient has had memory issues and word finding difficulty for almost 2 years now and she thinks that symptoms have gotten worse in the past 6 months. - Need outpatient further evaluation  History of pulmonary embolism in January 2023.  No longer on anticoagulation.  Diet Order             Diet NPO time specified Except for: Sips with Meds  Diet effective midnight                   Consultants: Orthopedic surgeon Oncologist Interventional radiologist  Procedures: None   Medications:    bisoprolol   2.5  mg Oral Daily   enoxaparin  (LOVENOX ) injection  40 mg Subcutaneous Q24H   feeding supplement  237 mL Oral BID BM   pantoprazole   20 mg Oral Daily   Continuous Infusions:  azithromycin  500 mg (03/25/24 1029)   cefTRIAXone (ROCEPHIN)  IV 2 g (03/25/24 0702)     Anti-infectives (From admission, onward)    Start     Dose/Rate Route  Frequency Ordered Stop   03/22/24 0500  cefTRIAXone (ROCEPHIN) 2 g in sodium chloride  0.9 % 100 mL IVPB        2 g 200 mL/hr over 30 Minutes Intravenous Every 24 hours 03/21/24 0801 03/27/24 0459   03/22/24 0500  azithromycin  (ZITHROMAX ) 500 mg in sodium chloride  0.9 % 250 mL IVPB        500 mg 250 mL/hr over 60 Minutes Intravenous Every 24 hours 03/21/24 0801 03/27/24 0459   03/21/24 0715  cefTRIAXone (ROCEPHIN) 1 g in sodium chloride  0.9 % 100 mL IVPB        1 g 200 mL/hr over 30 Minutes Intravenous  Once 03/21/24 0703 03/21/24 0744   03/21/24 0715  azithromycin  (ZITHROMAX ) 500 mg in sodium chloride  0.9 % 250 mL IVPB        500 mg 250 mL/hr over 60 Minutes Intravenous  Once 03/21/24 0703 03/21/24 0820       Family Communication/Anticipated D/C date and plan/Code Status   DVT prophylaxis: enoxaparin  (LOVENOX ) injection 40 mg Start: 03/21/24 2200     Code Status: Full Code  Family Communication: Discussed with wife on phone Disposition Plan: Plan to discharge home   Status is: Inpatient Remains inpatient appropriate because: Pneumonia, severe leukocytosis   Subjective:   Patient was seen and examined today.  He was under the impression that someone will remove his cast while he is in hospital.  I explained to him that he need to follow-up with orthopedic surgery as outpatient, seems understanding.  Objective:    Vitals:   03/24/24 1549 03/24/24 1952 03/25/24 0815 03/25/24 1208  BP: (!) 89/64 107/63 118/65   Pulse: (!) 58 66 75   Resp: 16 16 20    Temp: 98.2 F (36.8 C) 98.4 F (36.9 C) 98.6 F (37 C)   TempSrc:  Oral    SpO2: 98% 96% 98% 90%  Weight:      Height:       No data found.    Intake/Output Summary (Last 24 hours) at 03/25/2024 1422 Last data filed at 03/24/2024 1753 Gross per 24 hour  Intake --  Output 250 ml  Net -250 ml   Filed Weights   03/21/24 0345  Weight: 68 kg    Exam: General.  Frail elderly man, in no acute distress. Pulmonary.   Lungs clear bilaterally, normal respiratory effort. CV.  Regular rate and rhythm, no JVD, rub or murmur. Abdomen.  Soft, nontender, nondistended, BS positive. CNS.  Alert and oriented .  No focal neurologic deficit. Extremities.  No edema,  pulses intact and symmetrical.  Left upper extremity with cast involving wrist and forearm   Data Reviewed:   I have personally reviewed following labs and imaging studies:  Labs: Labs show the following:   Basic Metabolic Panel: Recent Labs  Lab 03/21/24 0350 03/22/24 0417 03/23/24 0400  NA 138 138 136  K 3.9 3.7 3.6  CL 105 105 104  CO2 25 23 25   GLUCOSE 103* 105* 104*  BUN 21 15 11   CREATININE 1.10 0.82 0.95  CALCIUM 8.8* 8.5*  8.6*   GFR Estimated Creatinine Clearance: 51.7 mL/min (by C-G formula based on SCr of 0.95 mg/dL). Liver Function Tests: Recent Labs  Lab 03/22/24 0417  AST 32  ALT 30  ALKPHOS 58  BILITOT 0.7  PROT 7.5  ALBUMIN 3.3*   No results for input(s): "LIPASE", "AMYLASE" in the last 168 hours. No results for input(s): "AMMONIA" in the last 168 hours. Coagulation profile Recent Labs  Lab 03/21/24 0349 03/24/24 1021  INR 1.2 1.3*    CBC: Recent Labs  Lab 03/21/24 0350 03/22/24 0417 03/23/24 0400 03/24/24 0410 03/25/24 0520  WBC 30.0* 32.7* 40.6* 45.2* 40.0*  NEUTROABS  --   --  7.3 7.4 7.7  HGB 11.1* 10.8* 10.7* 10.8* 10.5*  HCT 34.4* 33.1* 32.7* 33.9* 32.6*  MCV 98.3 96.8 96.7 98.5 97.6  PLT 147* 170 164 168 155   Cardiac Enzymes: No results for input(s): "CKTOTAL", "CKMB", "CKMBINDEX", "TROPONINI" in the last 168 hours. BNP (last 3 results) No results for input(s): "PROBNP" in the last 8760 hours. CBG: No results for input(s): "GLUCAP" in the last 168 hours. D-Dimer: No results for input(s): "DDIMER" in the last 72 hours. Hgb A1c: No results for input(s): "HGBA1C" in the last 72 hours. Lipid Profile: No results for input(s): "CHOL", "HDL", "LDLCALC", "TRIG", "CHOLHDL", "LDLDIRECT" in  the last 72 hours. Thyroid  function studies: No results for input(s): "TSH", "T4TOTAL", "T3FREE", "THYROIDAB" in the last 72 hours.  Invalid input(s): "FREET3" Anemia work up: No results for input(s): "VITAMINB12", "FOLATE", "FERRITIN", "TIBC", "IRON", "RETICCTPCT" in the last 72 hours. Sepsis Labs: Recent Labs  Lab 03/21/24 0418 03/22/24 0417 03/23/24 0400 03/24/24 0410 03/25/24 0520  WBC  --  32.7* 40.6* 45.2* 40.0*  LATICACIDVEN 0.7  --   --   --   --     Microbiology Recent Results (from the past 240 hours)  Blood Culture (routine x 2)     Status: None (Preliminary result)   Collection Time: 03/21/24  4:18 AM   Specimen: BLOOD  Result Value Ref Range Status   Specimen Description BLOOD BLOOD RIGHT ARM  Final   Special Requests   Final    BOTTLES DRAWN AEROBIC AND ANAEROBIC Blood Culture results may not be optimal due to an inadequate volume of blood received in culture bottles   Culture   Final    NO GROWTH 4 DAYS Performed at [facility], [address], [place], [place] [address]    Report Status PENDING  Incomplete  Blood Culture (routine x 2)     Status: None (Preliminary result)   Collection Time: 03/21/24  4:18 AM   Specimen: BLOOD  Result Value Ref Range Status   Specimen Description BLOOD RH  Final   Special Requests   Final    BOTTLES DRAWN AEROBIC AND ANAEROBIC Blood Culture results may not be optimal due to an inadequate volume of blood received in culture bottles   Culture   Final    NO GROWTH 4 DAYS Performed at [facility], [address], [place], [place] [address]    Report Status PENDING  Incomplete  Respiratory (~20 pathogens) panel by PCR     Status: None   Collection Time: 03/21/24  8:24 AM   Specimen: Nasopharyngeal Swab; Respiratory  Result Value Ref Range Status   Adenovirus NOT DETECTED NOT DETECTED Final   Coronavirus 229E NOT DETECTED NOT DETECTED Final    Comment: (NOTE) The Coronavirus on the Respiratory Panel,  DOES NOT test for the novel  Coronavirus (2019 nCoV)    Coronavirus HKU1 NOT DETECTED NOT DETECTED Final   Coronavirus NL63 NOT DETECTED NOT DETECTED Final   Coronavirus OC43 NOT DETECTED NOT DETECTED Final   Metapneumovirus NOT DETECTED NOT DETECTED Final   Rhinovirus / Enterovirus NOT DETECTED NOT DETECTED Final   Influenza A NOT DETECTED NOT DETECTED Final   Influenza B NOT DETECTED NOT DETECTED Final   Parainfluenza Virus 1 NOT DETECTED NOT DETECTED Final   Parainfluenza Virus 2 NOT DETECTED NOT DETECTED Final   Parainfluenza Virus 3 NOT DETECTED NOT DETECTED Final   Parainfluenza Virus 4 NOT DETECTED NOT DETECTED Final   Respiratory Syncytial Virus NOT DETECTED NOT DETECTED Final   Bordetella pertussis NOT DETECTED NOT DETECTED Final   Bordetella Parapertussis NOT DETECTED NOT DETECTED Final   Chlamydophila pneumoniae NOT DETECTED NOT DETECTED Final   Mycoplasma pneumoniae NOT DETECTED NOT DETECTED Final    Comment: Performed at [facility], [address] [place], [place] [address]  Expectorated Sputum Assessment w Gram Stain, Rflx to Resp Cult     Status: None   Collection Time: 03/24/24  3:58 PM   Specimen: Sputum  Result Value Ref Range Status   Specimen Description SPU  Final   Special Requests NONE  Final   Sputum evaluation   Final    Sputum specimen not acceptable for testing.  Please recollect.   Julieann Ode [id number] LRL Performed at [facility], [address] [place], [place] [address]    Report Status 03/24/2024 FINAL  Final    Procedures and diagnostic studies:  No results found.   LOS: 3 days   Luna Salinas  [facility]   Pager on [email or web address]. If 7PM-7AM, please contact night-coverage at [email or web address]  03/25/2024, 2:22 PM

## 2024-03-25 NOTE — Progress Notes (Addendum)
 Pt to [facility] to prep for BMB, patient refusing to have BMB unless we are removing his left arm cast first. Notified IR team and someone to come speak with patient.

## 2024-03-26 ENCOUNTER — Inpatient Hospital Stay

## 2024-03-26 ENCOUNTER — Other Ambulatory Visit: Payer: Self-pay | Admitting: Internal Medicine

## 2024-03-26 ENCOUNTER — Other Ambulatory Visit: Payer: Self-pay

## 2024-03-26 ENCOUNTER — Telehealth: Payer: Self-pay | Admitting: Internal Medicine

## 2024-03-26 DIAGNOSIS — J9601 Acute respiratory failure with hypoxia: Secondary | ICD-10-CM | POA: Diagnosis not present

## 2024-03-26 DIAGNOSIS — J189 Pneumonia, unspecified organism: Secondary | ICD-10-CM | POA: Diagnosis not present

## 2024-03-26 DIAGNOSIS — C4492 Squamous cell carcinoma of skin, unspecified: Secondary | ICD-10-CM

## 2024-03-26 DIAGNOSIS — J449 Chronic obstructive pulmonary disease, unspecified: Secondary | ICD-10-CM | POA: Diagnosis not present

## 2024-03-26 DIAGNOSIS — I25118 Atherosclerotic heart disease of native coronary artery with other forms of angina pectoris: Secondary | ICD-10-CM | POA: Diagnosis not present

## 2024-03-26 LAB — CULTURE, BLOOD (ROUTINE X 2)
Culture: NO GROWTH
Culture: NO GROWTH

## 2024-03-26 MED ORDER — HEPARIN SOD (PORK) LOCK FLUSH 100 UNIT/ML IV SOLN
500.0000 [IU] | Freq: Once | INTRAVENOUS | Status: AC
  Administered 2024-03-26: 500 [IU] via INTRAVENOUS

## 2024-03-26 MED ORDER — MIDAZOLAM HCL 2 MG/2ML IJ SOLN
INTRAMUSCULAR | Status: AC
  Filled 2024-03-26: qty 2

## 2024-03-26 MED ORDER — HYDROXYUREA 500 MG PO CAPS: 500.0000 mg | ORAL_CAPSULE | Freq: Every day | ORAL | 0 refills | Status: DC

## 2024-03-26 MED ORDER — ENSURE ENLIVE PO LIQD: 237.0000 mL | Freq: Two times a day (BID) | ORAL | 12 refills | Status: DC

## 2024-03-26 MED ORDER — MIDAZOLAM HCL 2 MG/2ML IJ SOLN
INTRAMUSCULAR | Status: AC | PRN
  Administered 2024-03-26: 1 mg via INTRAVENOUS

## 2024-03-26 MED ORDER — LIDOCAINE HCL (PF) 1 % IJ SOLN
10.0000 mL | Freq: Once | INTRAMUSCULAR | Status: AC
Start: 2024-03-26 — End: 2024-03-26
  Administered 2024-03-26: 10 mL via INTRADERMAL
  Filled 2024-03-26: qty 10

## 2024-03-26 MED ORDER — FENTANYL CITRATE (PF) 100 MCG/2ML IJ SOLN
INTRAMUSCULAR | Status: AC
  Filled 2024-03-26: qty 2

## 2024-03-26 MED ORDER — HEPARIN SOD (PORK) LOCK FLUSH 100 UNIT/ML IV SOLN
INTRAVENOUS | Status: AC
  Filled 2024-03-26: qty 5

## 2024-03-26 MED ORDER — FENTANYL CITRATE (PF) 100 MCG/2ML IJ SOLN
INTRAMUSCULAR | Status: AC | PRN
Start: 2024-03-26 — End: 2024-03-26
  Administered 2024-03-26: 25 ug via INTRAVENOUS

## 2024-03-26 MED ORDER — ONDANSETRON HCL 8 MG PO TABS: ORAL_TABLET | ORAL | 1 refills | Status: DC

## 2024-03-26 MED ORDER — HEPARIN SOD (PORK) LOCK FLUSH 100 UNIT/ML IV SOLN
INTRAVENOUS | Status: AC
Start: 2024-03-26 — End: ?
  Filled 2024-03-26: qty 5

## 2024-03-26 NOTE — Procedures (Signed)
 Pre procedural Dx: CT guided left iliac bone marrow biopsy  Post procedural Dx: Same  Technically successful CT guided biopsy of left iliac bone   EBL: None.   Complications: None immediate.   Zettie Hillock, MD Pager #: [phone]

## 2024-03-26 NOTE — Telephone Encounter (Signed)
 Ok fyi. This patient has seen DR. Finnegan in the past too

## 2024-03-26 NOTE — Progress Notes (Signed)
 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  Had a prescription for Zofran .  Patient has appointment with Kenney Peacemaker tomorrow-however family request a later in the day appointment.  Please check if this is feasible  Patient will follow-up with Dr. Adrian Alba.  Wife awrare of the plan-   GB

## 2024-03-26 NOTE — Progress Notes (Signed)
 Patient clinically stable post CT BMB, vitals stable, awake/alert and oriented. Report given to [facility] RN post procedure 114, plan reviewed.

## 2024-03-26 NOTE — Progress Notes (Signed)
  IR PROGRESS NOTE:  Patient was seen in IR this morning by PA and Dr. Burna Carrier for repeat attempt at bone marrow biopsy and aspiration. Reviewed procedure with patient and answered all questions to his satisfaction. Patient remains amenable to procedure, and consent is signed and in CT for review. No new concerns nor complaints.   Wt Readings from Last 3 Encounters:  03/26/24 149 lb 14.6 oz (68 kg)  07/16/23 160 lb (72.6 kg)  06/11/23 162 lb (73.5 kg)   Temp Readings from Last 3 Encounters:  03/26/24 98.6 F (37 C) (Oral)  01/29/24 98 F (36.7 C) (Oral)  07/16/23 97.8 F (36.6 C) (Tympanic)   BP Readings from Last 3 Encounters:  03/26/24 123/70  01/29/24 107/67  07/16/23 92/66   Pulse Readings from Last 3 Encounters:  03/26/24 88  01/29/24 68  07/16/23 64   Physical Exam Constitutional:      Appearance: He is well-developed.  Cardiovascular:     Rate and Rhythm: Normal rate and regular rhythm.     Heart sounds: Normal heart sounds.  Pulmonary:     Effort: Pulmonary effort is normal.     Breath sounds: Normal breath sounds.  Abdominal:     Palpations: Abdomen is soft.  Musculoskeletal:        General: Normal range of motion.     Cervical back: Normal range of motion.     Comments: Cast on left arm.  Skin:    General: Skin is warm and dry.  Neurological:     General: No focal deficit present.     Mental Status: He is alert and oriented to person, place, and time.  Psychiatric:        Mood and Affect: Mood normal.        Behavior: Behavior normal.     Electronically Signed: Lovena Rubinstein, PA-C 03/26/2024, 8:46 AM

## 2024-03-26 NOTE — TOC Transition Note (Signed)
 Transition of Care [facility]) - Discharge Note   Patient Details  Name: Dylan Weber MRN: [id number] Date of Birth: [birth year]  [facility] CM/SW Contact:  Crayton Docker, RN 03/26/2024, 10:51 AM   Clinical Narrative:     Discharge orders noted. CM secure message to Sam Creighton, [employer] regarding home oxygen orders and delivery of portable oxygen unit to bedside.   Final next level of care: Home/Self Care Barriers to Discharge: No Barriers Identified   Patient Goals and CMS Choice    Home/self care     Discharge Placement      Home/self care           Discharge Plan and Services Additional resources added to the After Visit Summary for               DME Arranged: Oxygen DME Agency: [employer] Date DME Agency Contacted: 03/26/24 Time DME Agency Contacted: 1051 Representative spoke with at DME Agency: Sam Creighton  Social Drivers of Health (SDOH) Interventions SDOH Screenings   Food Insecurity: No Food Insecurity (03/21/2024)  Housing: Unknown (03/21/2024)  Transportation Needs: No Transportation Needs (03/21/2024)  Utilities: Not At Risk (03/21/2024)  Depression (PHQ2-9): Low Risk  (07/16/2023)  Financial Resource Strain: Low Risk  (12/02/2023)   Received from [facility]  Social Connections: Unknown (03/21/2024)  Tobacco Use: Medium Risk (03/25/2024)     Readmission Risk Interventions     No data to display

## 2024-03-26 NOTE — Discharge Summary (Signed)
 Physician Discharge Summary   Patient: Dylan Weber MRN: [id number] DOB: 06-22-1933  Admit date:     03/21/2024  Discharge date: 03/26/24  Discharge Physician: Luna Salinas   PCP: Will Hare, NP   Recommendations at discharge:  Follow-up with oncology for results of bone marrow biopsy and further management Follow-up with orthopedic surgery for left upper extremity cast removal and further management Follow-up with primary care provider.  Discharge Diagnoses: Principal Problem:   Acute respiratory failure with hypoxia (HCC) Active Problems:   SVT (supraventricular tachycardia) (HCC)   COPD (chronic obstructive pulmonary disease) (HCC)   Pulmonary embolism (HCC)   CAD (coronary artery disease)   Leukocytosis   Hospital Course: Dylan Weber is a 89 y.o. male with medical history significant for COPD, history of chronic hypoxic respiratory failure no longer on oxygen, history of PE, history of SVT, who presented to the hospital because of nausea, intermittent chest pain and shortness of breath.  Patient was found to be hypoxic  with oxygen saturation in the 80s by EMS.   WBC 30 and CT chest showed  right upper lobe pneumonia.  Patient completed a course of antibiotics for pneumonia.  Worsening leukocytosis, monocyte predominance oncology was consulted.  Flow cytometry with concern of blast, patient had his bone marrow biopsy done on 5/15 and will have a close follow-up with oncology for further management.  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  Patient was also found to have a right lower lobe nodule which need further evaluation and follow-up as outpatient.  Patient has an history of prior smoking, quit 10 years ago.  Patient has a recent left wrist injury and was admitted with cast in place.  For which he needs to follow-up with orthopedic surgery as he wants it to be removed.  Patient also has an history of PE in January 2023 and no longer on any anticoagulation.  He will  continue on current medications and need to have a close follow-up with his providers for further assistance  Patient is high risk for mortality and readmission due to advanced age and significant underlying comorbidities and the concern of new diagnosis of high-grade leukemia.  Consultants: Oncology.  Interventional radiology. Procedures performed: Bone marrow biopsy Disposition: Home Diet recommendation:  Discharge Diet Orders (From admission, onward)     Start     Ordered   03/26/24 0000  Diet - low sodium heart healthy        03/26/24 1007           Regular diet DISCHARGE MEDICATION: Allergies as of 03/26/2024       Reactions   Pregabalin Anaphylaxis   Diltiazem  Other (See Comments)   Flushing/redness to body   Prednisone  Other (See Comments)   jittery   Meloxicam Nausea Only   Amoxicillin Rash   Sulfamethoxazole-trimethoprim Rash        Medication List     STOP taking these medications    doxepin 10 MG capsule Commonly known as: SINEQUAN   gabapentin 100 MG capsule Commonly known as: NEURONTIN   PREVAGEN PO   torsemide  10 MG tablet Commonly known as: DEMADEX    traMADol 50 MG tablet Commonly known as: ULTRAM       TAKE these medications    acetaminophen  650 MG CR tablet Commonly known as: TYLENOL  Take 650 mg by mouth as needed for pain.   acetaminophen -codeine 300-30 MG tablet Commonly known as: TYLENOL  #3 Take 1 tablet by mouth every 8 (eight) hours as needed for moderate pain (  pain score 4-6).   albuterol  108 (90 Base) MCG/ACT inhaler Commonly known as: VENTOLIN  HFA Inhale 2 puffs into the lungs every 6 (six) hours as needed for wheezing or shortness of breath.   bisoprolol  5 MG tablet Commonly known as: ZEBETA  TAKE 1 TABLET (5 MG TOTAL) BY MOUTH IN THE MORNING AND AT BEDTIME   ciclopirox  0.77 % Susp Commonly known as: LOPROX  Apply to toenails QD-BID.   Combivent  Respimat 20-100 MCG/ACT Aers respimat Generic drug:  Ipratropium-Albuterol  Inhale 1 puff into the lungs every 6 (six) hours as needed.   feeding supplement Liqd Take 237 mLs by mouth 2 (two) times daily between meals.   multivitamin capsule Take 1 capsule by mouth daily.   mupirocin  ointment 2 % Commonly known as: BACTROBAN  Apply 1 Application topically daily.   pantoprazole  20 MG tablet Commonly known as: PROTONIX  Take 20 mg by mouth daily.   PROBIOTIC ADVANCED PO Take by mouth daily.               Durable Medical Equipment  (From admission, onward)           Start     Ordered   03/26/24 1007  For home use only DME oxygen  Once       Question Answer Comment  Length of Need Lifetime   Mode or (Route) Nasal cannula   Liters per Minute 2   Frequency Continuous (stationary and portable oxygen unit needed)   Oxygen conserving device Yes   Oxygen delivery system Gas      03/26/24 1006            Follow-up Information     Fields, Daylene Evangelist, NP Follow up.   Specialty: Family Medicine Why: Hospital follow up Contact information: [address] [place] [address] [phone]         Erskin Hearing, MD Follow up.   Specialty: Pulmonary Disease Why: Hospital follow up Contact information: [address] [place] [address] [phone]                Discharge Exam: Dylan Weber Weights   03/21/24 0345 03/26/24 0756  Weight: 68 kg 68 kg   General.  Frail elderly man, in no acute distress. Pulmonary.  Lungs clear bilaterally, normal respiratory effort. CV.  Regular rate and rhythm, no JVD, rub or murmur. Abdomen.  Soft, nontender, nondistended, BS positive. CNS.  Alert and oriented .  No focal neurologic deficit. Extremities.  No edema, no cyanosis, pulses intact and symmetrical. Psychiatry.  Judgment and insight appears normal.   Condition at discharge: stable  The results of significant diagnostics from this hospitalization (including imaging, microbiology, ancillary and  laboratory) are listed below for reference.   Imaging Studies: CT Angio Chest PE W/Cm &/Or Wo Cm Result Date: 03/21/2024 CLINICAL DATA:  Evaluate for acute pulmonary embolism. EXAM: CT ANGIOGRAPHY CHEST WITH CONTRAST TECHNIQUE: Multidetector CT imaging of the chest was performed using the standard protocol during bolus administration of intravenous contrast. Multiplanar CT image reconstructions and MIPs were obtained to evaluate the vascular anatomy. RADIATION DOSE REDUCTION: This exam was performed according to the departmental dose-optimization program which includes automated exposure control, adjustment of the mA and/or kV according to patient size and/or use of iterative reconstruction technique. CONTRAST:  75mL OMNIPAQUE  IOHEXOL  350 MG/ML SOLN COMPARISON:  06/11/2023 FINDINGS: Cardiovascular: Satisfactory opacification of the pulmonary arteries to the segmental level. No evidence of pulmonary embolism. Normal heart size. No pericardial effusion. Aortic atherosclerosis. The ascending thoracic aorta  measures 4 cm, image 43/7. Coronary artery calcifications. Mediastinum/Nodes: Thyroid  gland, trachea, and esophagus appear normal. No mediastinal adenopathy. Right hilar node measures 1.5 cm, image 61/4. Lungs/Pleura: Emphysema with diffuse bronchial wall thickening. Within the anterior right upper lobe there is a patchy area of ground-glass and airspace density, image 54/5. Right upper lobe lung nodule measures 5 mm, image 38/5. Unchanged from the previous exam. This is been stable since 12/18/2022 compatible with a benign nodule. Peripheral nodule within the right upper lobe measuring 2 mm is also unchanged from 12/28/2022. 4 mm nodule in the right lower lobe appears new, image 78/5. Upper Abdomen: No acute abnormality.  Cholecystectomy. Musculoskeletal: Thoracic degenerative disc disease. No acute or suspicious osseous findings. Review of the MIP images confirms the above findings. IMPRESSION: 1. No evidence  for acute pulmonary embolism. 2. Patchy area of ground-glass and airspace density within the anterior right upper lobe is favored to represent an area of pneumonia. Recommend follow-up imaging to ensure resolution and to rule out underlying malignancy. 3. Right hilar lymphadenopathy is favored to be reactive. 4. New 4 mm nodule in the right lower lobe. If the patient is at high risk for bronchogenic carcinoma, follow-up chest CT at 1year is recommended. If the patient is at low risk, no follow-up is needed. This recommendation follows the consensus statement: Guidelines for Management of Small Pulmonary Nodules Detected on CT Scans: A Statement from the Fleischner Society as published in Radiology 2005; 237:395-400. 5. Coronary artery calcifications. 6. 4 cm ascending thoracic aorta. Recommend annual imaging followup by CTA or MRA. This recommendation follows 2010 ACCF/AHA/AATS/ACR/ASA/SCA/SCAI/SIR/STS/SVM Guidelines for the Diagnosis and Management of Patients with Thoracic Aortic Disease. Circulation. 2010; 121: U981-X914. Aortic aneurysm NOS (ICD10-I71.9) 7. Aortic Atherosclerosis (ICD10-I70.0) and Emphysema (ICD10-J43.9). Electronically Signed   By: Kimberley Penman M.D.   On: 03/21/2024 06:44   DG Chest Portable 1 View Result Date: 03/21/2024 CLINICAL DATA:  Shortness of breath and hypoxia.  Chest pain. EXAM: PORTABLE CHEST 1 VIEW COMPARISON:  06/11/2023. FINDINGS: The heart size and mediastinal contours are within normal limits. Faint right upper lobe opacity is noted concerning for early pneumonia. Left lung appears clear. The visualized osseous structures are intact. The visualized skeletal structures are unremarkable. IMPRESSION: Faint right upper lobe opacity concerning for early pneumonia. Electronically Signed   By: Kimberley Penman M.D.   On: 03/21/2024 06:34    Microbiology: Results for orders placed or performed during the hospital encounter of 03/21/24  Blood Culture (routine x 2)     Status:  None   Collection Time: 03/21/24  4:18 AM   Specimen: BLOOD  Result Value Ref Range Status   Specimen Description BLOOD BLOOD RIGHT ARM  Final   Special Requests   Final    BOTTLES DRAWN AEROBIC AND ANAEROBIC Blood Culture results may not be optimal due to an inadequate volume of blood received in culture bottles   Culture   Final    NO GROWTH 5 DAYS Performed at [facility], [address] [place], [place] [address]    Report Status 03/26/2024 FINAL  Final  Blood Culture (routine x 2)     Status: None   Collection Time: 03/21/24  4:18 AM   Specimen: BLOOD  Result Value Ref Range Status   Specimen Description BLOOD RH  Final   Special Requests   Final    BOTTLES DRAWN AEROBIC AND ANAEROBIC Blood Culture results may not be optimal due to an inadequate volume of blood received in culture  bottles   Culture   Final    NO GROWTH 5 DAYS Performed at [facility], [address] [place], [place] [address]    Report Status 03/26/2024 FINAL  Final  Respiratory (~20 pathogens) panel by PCR     Status: None   Collection Time: 03/21/24  8:24 AM   Specimen: Nasopharyngeal Swab; Respiratory  Result Value Ref Range Status   Adenovirus NOT DETECTED NOT DETECTED Final   Coronavirus 229E NOT DETECTED NOT DETECTED Final    Comment: (NOTE) The Coronavirus on the Respiratory Panel, DOES NOT test for the novel  Coronavirus (2019 nCoV)    Coronavirus HKU1 NOT DETECTED NOT DETECTED Final   Coronavirus NL63 NOT DETECTED NOT DETECTED Final   Coronavirus OC43 NOT DETECTED NOT DETECTED Final   Metapneumovirus NOT DETECTED NOT DETECTED Final   Rhinovirus / Enterovirus NOT DETECTED NOT DETECTED Final   Influenza A NOT DETECTED NOT DETECTED Final   Influenza B NOT DETECTED NOT DETECTED Final   Parainfluenza Virus 1 NOT DETECTED NOT DETECTED Final   Parainfluenza Virus 2 NOT DETECTED NOT DETECTED Final   Parainfluenza Virus 3 NOT DETECTED NOT DETECTED Final   Parainfluenza Virus 4  NOT DETECTED NOT DETECTED Final   Respiratory Syncytial Virus NOT DETECTED NOT DETECTED Final   Bordetella pertussis NOT DETECTED NOT DETECTED Final   Bordetella Parapertussis NOT DETECTED NOT DETECTED Final   Chlamydophila pneumoniae NOT DETECTED NOT DETECTED Final   Mycoplasma pneumoniae NOT DETECTED NOT DETECTED Final    Comment: Performed at [facility], [address], [place], [place] [address]  Expectorated Sputum Assessment w Gram Stain, Rflx to Resp Cult     Status: None   Collection Time: 03/24/24  3:58 PM   Specimen: Sputum  Result Value Ref Range Status   Specimen Description SPU  Final   Special Requests NONE  Final   Sputum evaluation   Final    Sputum specimen not acceptable for testing.  Please recollect.   Meda Spies [id number] LRL Performed at [facility], [address] [place] [place], [place] [address]    Report Status 03/24/2024 FINAL  Final    Labs: CBC: Recent Labs  Lab 03/21/24 0350 03/22/24 0417 03/23/24 0400 03/24/24 0410 03/25/24 0520  WBC 30.0* 32.7* 40.6* 45.2* 40.0*  NEUTROABS  --   --  7.3 7.4 7.7  HGB 11.1* 10.8* 10.7* 10.8* 10.5*  HCT 34.4* 33.1* 32.7* 33.9* 32.6*  MCV 98.3 96.8 96.7 98.5 97.6  PLT 147* 170 164 168 155   Basic Metabolic Panel: Recent Labs  Lab 03/21/24 0350 03/22/24 0417 03/23/24 0400  NA 138 138 136  K 3.9 3.7 3.6  CL 105 105 104  CO2 25 23 25   GLUCOSE 103* 105* 104*  BUN 21 15 11   CREATININE 1.10 0.82 0.95  CALCIUM 8.8* 8.5* 8.6*   Liver Function Tests: Recent Labs  Lab 03/22/24 0417  AST 32  ALT 30  ALKPHOS 58  BILITOT 0.7  PROT 7.5  ALBUMIN 3.3*   CBG: No results for input(s): "GLUCAP" in the last 168 hours.  Discharge time spent: greater than 30 minutes.  This record has been created using Conservation officer, historic buildings. Errors have been sought and corrected,but may not always be located. Such creation errors do not reflect on the standard of care.   Signed: Luna Salinas,  MD [facility] 03/26/2024

## 2024-03-26 NOTE — Plan of Care (Signed)
  Problem: Health Behavior/Discharge Planning: Goal: Ability to manage health-related needs will improve Outcome: Progressing   Problem: Clinical Measurements: Goal: Cardiovascular complication will be avoided Outcome: Progressing   Problem: Coping: Goal: Level of anxiety will decrease Outcome: Progressing   Problem: Elimination: Goal: Will not experience complications related to urinary retention Outcome: Progressing

## 2024-03-26 NOTE — Progress Notes (Signed)
 PT Cancellation Note  Patient Details Name: Dylan Weber MRN: [id number] DOB: 08/30/1933   Cancelled Treatment:    Reason Eval/Treat Not Completed: Patient declined, no reason specified Pt wanting to get ready to go home.  No change in PT D/C recommendations.   Eliazar Gross, PTA  03/26/24, 1:13 PM

## 2024-03-26 NOTE — Telephone Encounter (Signed)
 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   APP- lab- cbc/cmp; LDH; uric acid; phos, mag-  Follow up with MD- on 5/21- MD; lab- cbc/LDH; bmp-uric acid; phos, mag-  Dr.rao-FYI-

## 2024-03-26 NOTE — TOC Initial Note (Signed)
 Transition of Care [facility]) - Initial/Assessment Note    Patient Details  Name: Dylan Weber MRN: [id number] Date of Birth: Apr 08, 1933  [facility] CM/SW Contact:    Crayton Docker, RN 03/26/2024, 10:43 AM  Clinical Narrative:                  CM to patient's room regarding TOC screening assessment. Patient's wife, Dylan Weber is at patient's bedside. CM introduced case management role and discharge care planning process. Per patient lives with wife and 1 cat--vaccinated. Patient states uses 1 single point care.   Alert received from Dr. Ariel Begun regarding pending discharge and home oxygen orders. Noted, oxygen qualifying note. CM will contact [employer] for processing order for home oxygen and delivery of portable oxygen unit to bedside.  Expected Discharge Plan: Home/Self Care Barriers to Discharge: Continued Medical Work up   Patient Goals and CMS Choice    Home/self care     Expected Discharge Plan and Services      Home/self care  Living arrangements for the past 2 months: Single Family Home Expected Discharge Date: 03/26/24               DME Arranged: Jeananne Mighty     Prior Living Arrangements/Services Living arrangements for the past 2 months: Single Family Home Lives with:: Spouse Patient language and need for interpreter reviewed:: No Do you feel safe going back to the place where you live?: Yes      Need for Family Participation in Patient Care: Yes (Comment) Care giver support system in place?: Yes (comment)   Criminal Activity/Legal Involvement Pertinent to Current Situation/Hospitalization: No - Comment as needed  Activities of Daily Living   ADL Screening (condition at time of admission) Independently performs ADLs?: Yes (appropriate for developmental age) Is the patient deaf or have difficulty hearing?: No Does the patient have difficulty seeing, even when wearing glasses/contacts?: No Does the patient have difficulty concentrating, remembering, or making  decisions?: No  Permission Sought/Granted Permission sought to share information with : Case Manager, Family Supports Permission granted to share information with : Yes, Verbal Permission Granted  Share Information with NAME: Dylan Weber     Permission granted to share info w Relationship: wife  Permission granted to share info w Contact Information: yes  Emotional Assessment Appearance:: Appears stated age Attitude/Demeanor/Rapport: Engaged Affect (typically observed): Calm Orientation: : Oriented to Self, Oriented to Place, Oriented to  Time, Oriented to Situation Alcohol / Substance Use: Alcohol Use (03/20/2024) Psych Involvement: No (comment)  Admission diagnosis:  Acute respiratory failure with hypoxia (HCC) [J96.01] Community acquired pneumonia, unspecified laterality [J18.9] Patient Active Problem List   Diagnosis Date Noted   Acute respiratory failure with hypoxia (HCC) 03/21/2024   Leukocytosis 03/21/2024   Squamous cell carcinoma of skin 07/16/2023   CAD (coronary artery disease) 12/31/2021   Near syncope 11/24/2021   Pulmonary embolism (HCC) 11/24/2021   Elevated troponin 11/24/2021   Hypoxia 11/01/2021   Acute on chronic respiratory failure with hypoxia (HCC)    SVT (supraventricular tachycardia) (HCC)    COPD (chronic obstructive pulmonary disease) (HCC)    Gastroesophageal reflux disease without esophagitis    COVID-19 virus infection 10/31/2021   PCP:  Will Hare, NP Pharmacy:   [facility] [phone] [place], [place] - [address] [place] [address] Phone: [phone] Fax: [phone]     Social Drivers of Health (SDOH) Social History: SDOH Screenings   Food Insecurity: No Food Insecurity (03/21/2024)  Housing: Unknown (  03/21/2024)  Transportation Needs: No Transportation Needs (03/21/2024)  Utilities: Not At Risk (03/21/2024)  Depression (PHQ2-9): Low Risk  (07/16/2023)  Financial Resource Strain: Low Risk  (12/02/2023)    Received from [facility]  Social Connections: Unknown (03/21/2024)  Tobacco Use: Medium Risk (03/25/2024)   SDOH Interventions:     Readmission Risk Interventions     No data to display

## 2024-03-27 ENCOUNTER — Inpatient Hospital Stay: Admitting: Nurse Practitioner

## 2024-03-27 ENCOUNTER — Inpatient Hospital Stay

## 2024-03-27 ENCOUNTER — Telehealth: Payer: Self-pay | Admitting: *Deleted

## 2024-03-27 ENCOUNTER — Inpatient Hospital Stay: Attending: Nurse Practitioner

## 2024-03-27 ENCOUNTER — Encounter: Payer: Self-pay | Admitting: Nurse Practitioner

## 2024-03-27 ENCOUNTER — Encounter: Payer: Self-pay | Admitting: Internal Medicine

## 2024-03-27 VITALS — BP 83/61 | HR 96 | Temp 98.3°F | Resp 17 | Wt 156.2 lb

## 2024-03-27 DIAGNOSIS — C9200 Acute myeloblastic leukemia, not having achieved remission: Secondary | ICD-10-CM | POA: Diagnosis not present

## 2024-03-27 DIAGNOSIS — M2550 Pain in unspecified joint: Secondary | ICD-10-CM | POA: Insufficient documentation

## 2024-03-27 DIAGNOSIS — Z9981 Dependence on supplemental oxygen: Secondary | ICD-10-CM | POA: Insufficient documentation

## 2024-03-27 DIAGNOSIS — R531 Weakness: Secondary | ICD-10-CM | POA: Diagnosis not present

## 2024-03-27 DIAGNOSIS — N179 Acute kidney failure, unspecified: Secondary | ICD-10-CM | POA: Diagnosis not present

## 2024-03-27 DIAGNOSIS — M549 Dorsalgia, unspecified: Secondary | ICD-10-CM | POA: Insufficient documentation

## 2024-03-27 DIAGNOSIS — R5383 Other fatigue: Secondary | ICD-10-CM | POA: Insufficient documentation

## 2024-03-27 DIAGNOSIS — D696 Thrombocytopenia, unspecified: Secondary | ICD-10-CM | POA: Insufficient documentation

## 2024-03-27 DIAGNOSIS — E871 Hypo-osmolality and hyponatremia: Secondary | ICD-10-CM | POA: Insufficient documentation

## 2024-03-27 DIAGNOSIS — Z79899 Other long term (current) drug therapy: Secondary | ICD-10-CM | POA: Insufficient documentation

## 2024-03-27 DIAGNOSIS — Z87891 Personal history of nicotine dependence: Secondary | ICD-10-CM | POA: Insufficient documentation

## 2024-03-27 DIAGNOSIS — C4492 Squamous cell carcinoma of skin, unspecified: Secondary | ICD-10-CM

## 2024-03-27 LAB — CBC WITH DIFFERENTIAL (CANCER CENTER ONLY)
Abs Immature Granulocytes: 4.1 10*3/uL — ABNORMAL HIGH (ref 0.00–0.07)
Basophils Absolute: 0 10*3/uL (ref 0.0–0.1)
Basophils Relative: 0 %
Blasts: 6 %
Eosinophils Absolute: 0 10*3/uL (ref 0.0–0.5)
Eosinophils Relative: 0 %
HCT: 34.3 % — ABNORMAL LOW (ref 39.0–52.0)
Hemoglobin: 10.9 g/dL — ABNORMAL LOW (ref 13.0–17.0)
Lymphocytes Relative: 11 %
Lymphs Abs: 5.6 10*3/uL — ABNORMAL HIGH (ref 0.7–4.0)
MCH: 31.2 pg (ref 26.0–34.0)
MCHC: 31.8 g/dL (ref 30.0–36.0)
MCV: 98.3 fL (ref 80.0–100.0)
Metamyelocytes Relative: 2 %
Monocytes Absolute: 29.8 10*3/uL — ABNORMAL HIGH (ref 0.1–1.0)
Monocytes Relative: 58 %
Myelocytes: 6 %
Neutro Abs: 8.7 10*3/uL — ABNORMAL HIGH (ref 1.7–7.7)
Neutrophils Relative %: 17 %
Platelet Count: 159 10*3/uL (ref 150–400)
RBC: 3.49 MIL/uL — ABNORMAL LOW (ref 4.22–5.81)
RDW: 15.9 % — ABNORMAL HIGH (ref 11.5–15.5)
Smear Review: NORMAL
WBC Count: 51.3 10*3/uL (ref 4.0–10.5)
nRBC: 0.7 % — ABNORMAL HIGH (ref 0.0–0.2)

## 2024-03-27 LAB — CMP (CANCER CENTER ONLY)
ALT: 31 U/L (ref 0–44)
AST: 32 U/L (ref 15–41)
Albumin: 3.4 g/dL — ABNORMAL LOW (ref 3.5–5.0)
Alkaline Phosphatase: 64 U/L (ref 38–126)
Anion gap: 13 (ref 5–15)
BUN: 18 mg/dL (ref 8–23)
CO2: 22 mmol/L (ref 22–32)
Calcium: 8.4 mg/dL — ABNORMAL LOW (ref 8.9–10.3)
Chloride: 97 mmol/L — ABNORMAL LOW (ref 98–111)
Creatinine: 1.37 mg/dL — ABNORMAL HIGH (ref 0.61–1.24)
GFR, Estimated: 50 mL/min — ABNORMAL LOW (ref >60)
Glucose, Bld: 134 mg/dL — ABNORMAL HIGH (ref 70–99)
Potassium: 3.3 mmol/L — ABNORMAL LOW (ref 3.5–5.1)
Sodium: 132 mmol/L — ABNORMAL LOW (ref 135–145)
Total Bilirubin: 0.5 mg/dL (ref 0.0–1.2)
Total Protein: 8.5 g/dL — ABNORMAL HIGH (ref 6.5–8.1)

## 2024-03-27 LAB — URIC ACID: Uric Acid, Serum: 9.6 mg/dL — ABNORMAL HIGH (ref 3.7–8.6)

## 2024-03-27 LAB — PHOSPHORUS: Phosphorus: 2.7 mg/dL (ref 2.5–4.6)

## 2024-03-27 LAB — LACTATE DEHYDROGENASE: LDH: 459 U/L — ABNORMAL HIGH (ref 98–192)

## 2024-03-27 LAB — MAGNESIUM: Magnesium: 2 mg/dL (ref 1.7–2.4)

## 2024-03-27 NOTE — Progress Notes (Signed)
 [facility]  Telephone:([phone]) [phone] Fax:([phone]) [phone]  ID: Dylan Weber OB: 07-08-1933  MR#: [id number]  VQQ#:[id number]  Patient Care Team: Will Hare, NP as PCP - General (Family Medicine) Devorah Fonder, MD as PCP - Cardiology (Cardiology)  CHIEF COMPLAINT: Squamous cell carcinoma of the scalp.  INTERVAL HISTORY: Patient is an 89 year old male who who previously was known to clinic for squamous cell carcinoma of right temple, on surveillance, who returns to cancer center for hospital follow up.   In interim, he presented to hospital on 03/21/24 for shortness of breath, found to be hypoxic with oxygen saturation in 80s by EMS. WBC 30, ct showed RUL pneumonia and he completed course of antibiotics. Leukocytosis worsened, predominantly monocytes. Flow cytometry showed concern for blasts and bone marrow biopsy was performed on 5/15 and he was discharged home.   He complains of weight loss and generalized fatigue that does not improve with rest. He has ongoing shortness of breath. Denies infections, fever, but does has chills. Denies night sweats.   REVIEW OF SYSTEMS:   Review of Systems  Constitutional:  Positive for chills, malaise/fatigue and weight loss. Negative for fever.  Respiratory:  Positive for shortness of breath. Negative for cough and hemoptysis.   Cardiovascular: Negative.  Negative for chest pain and leg swelling.  Gastrointestinal:  Negative for abdominal pain.  Genitourinary:  Negative for dysuria.  Musculoskeletal:  Negative for back pain.  Skin: Negative.  Negative for rash.  Neurological:  Positive for weakness. Negative for dizziness, focal weakness and headaches.  Endo/Heme/Allergies:  Bruises/bleeds easily.  Psychiatric/Behavioral: Negative.  The patient is not nervous/anxious.   As per HPI. Otherwise, a complete review of systems is negative.  PAST MEDICAL HISTORY: Past Medical History:  Diagnosis Date   Basal cell  carcinoma 06/19/2022   Dorsum nose. Nodular. EDC 07/17/2022   BCC (basal cell carcinoma) 10/11/2022   right nasal supratip, superficial and nodular, EDC 12/19/22   COPD (chronic obstructive pulmonary disease) (HCC)    Coronary artery calcification seen on CT scan 11/17/2021   COVID-19 10/2021   Diastolic dysfunction    a. 11/2021 Echo: EF 55-60%, no rwma, GrI DD, nl RV fxn. Mild AI.   GERD (gastroesophageal reflux disease)    Pulmonary embolism (HCC)    a. 11/2021 CTA Chest: small peripheral PE in RLL branch of PA-->Eliquis .   Right leg DVT (HCC)    a. 11/2021 LE U/S: occlusive R calf vein DVT in R peroneal vein-->eliquis .   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 [facility] 07/17/2022   SVT (supraventricular tachycardia) (HCC)    a. Dx 10/2021 in setting of COVID infxn.   PAST SURGICAL HISTORY: Past Surgical History:  Procedure Laterality Date   APPENDECTOMY     CHOLECYSTECTOMY     EYE SURGERY     FAMILY HISTORY: Family History  Problem Relation Age of Onset   Uterine cancer Mother    Brain cancer Sister    ADVANCED DIRECTIVES (Y/N):  N  HEALTH MAINTENANCE: Social History   Tobacco Use   Smoking status: Former    Types: Cigarettes   Smokeless tobacco: Former  Building services engineer status: Never Used  Substance Use Topics   Alcohol use: Yes    Alcohol/week: 7.0 standard drinks of alcohol    Types: 7 Shots of liquor per week   Drug use: Never  Colonoscopy:  PAP:  Bone density:  Lipid panel:  Allergies  Allergen Reactions   Pregabalin Anaphylaxis   Diltiazem  Other (See Comments)    Flushing/redness to body   Prednisone  Other (See Comments)    jittery   Meloxicam Nausea Only   Amoxicillin Rash   Sulfamethoxazole-Trimethoprim Rash    Current Outpatient Medications  Medication Sig Dispense Refill   acetaminophen  (TYLENOL ) 650 MG CR tablet Take 650 mg by  mouth as needed for pain.     acetaminophen -codeine  (TYLENOL  #3) 300-30 MG tablet Take 1 tablet by mouth every 8 (eight) hours as needed for moderate pain (pain score 4-6).     albuterol  (VENTOLIN  HFA) 108 (90 Base) MCG/ACT inhaler Inhale 2 puffs into the lungs every 6 (six) hours as needed for wheezing or shortness of breath. 8 g 0   bisoprolol  (ZEBETA ) 5 MG tablet TAKE 1 TABLET (5 MG TOTAL) BY MOUTH IN THE MORNING AND AT BEDTIME 180 tablet 3   ciclopirox  (LOPROX ) 0.77 % SUSP Apply to toenails QD-BID. 30 mL 11   COMBIVENT  RESPIMAT 20-100 MCG/ACT AERS respimat Inhale 1 puff into the lungs every 6 (six) hours as needed.     feeding supplement (ENSURE ENLIVE / ENSURE PLUS) LIQD Take 237 mLs by mouth 2 (two) times daily between meals. 237 mL 12   hydroxyurea  (HYDREA ) 500 MG capsule Take 1 capsule (500 mg total) by mouth daily. May take with food to minimize GI side effects. 30 capsule 0   Multiple Vitamin (MULTIVITAMIN) capsule Take 1 capsule by mouth daily.     mupirocin  ointment (BACTROBAN ) 2 % Apply 1 Application topically daily. 22 g 0   ondansetron  (ZOFRAN ) 8 MG tablet One pill every 8 hours as needed for nausea/vomitting. 40 tablet 1   pantoprazole  (PROTONIX ) 20 MG tablet Take 20 mg by mouth daily.     Probiotic Product (PROBIOTIC ADVANCED PO) Take by mouth daily.     No current facility-administered medications for this visit.   OBJECTIVE: Vitals:   03/27/24 1404  BP: (!) 83/61  Pulse: 96  Resp: 17  Temp: 98.3 F (36.8 C)  SpO2: 92%      Body mass index is 23.07 kg/m.    ECOG FS:2 - Symptomatic, <50% confined to bed  General: frail appearing elderly male. In wheelchair. On oxygen. Accompanied.  Eyes: Pink conjunctiva, anicteric sclera. HEENT: Normocephalic, moist mucous membranes. No palpable lymphadenopathy. Lungs: diminished bilaterally Heart: Regular rate and rhythm. Abdomen: Soft, nontender, no obvious distention. Musculoskeletal: No edema, cyanosis, or clubbing. Neuro:  Alert, answering all questions appropriately. Cranial nerves grossly intact. Skin: bruising. Pale.  Psych: Normal affect.  LAB RESULTS: Lab Results  Component Value Date   NA 136 03/23/2024   K 3.6 03/23/2024   CL 104 03/23/2024   CO2 25 03/23/2024   GLUCOSE 104 (H) 03/23/2024   BUN 11 03/23/2024   CREATININE 0.95 03/23/2024   CALCIUM 8.6 (L) 03/23/2024   PROT 7.5 03/22/2024   ALBUMIN 3.3 (L) 03/22/2024   AST 32 03/22/2024   ALT 30 03/22/2024   ALKPHOS 58 03/22/2024   BILITOT 0.7 03/22/2024   GFRNONAA >60 03/23/2024   Lab Results  Component Value Date   WBC 40.0 (H) 03/25/2024   NEUTROABS 7.7 03/25/2024   HGB 10.5 (L) 03/25/2024   HCT 32.6 (L) 03/25/2024   MCV 97.6 03/25/2024   PLT 155 03/25/2024   STUDIES: CT BONE MARROW BIOPSY & ASPIRATION Result Date: 03/26/2024 CLINICAL DATA:  Leukocytosis with mild glass on peripheral  smear concerning for acute leukemia. EXAM: CT-guided bone marrow biopsy TECHNIQUE: CT pelvis CONTRAST:  None RADIOPHARMACEUTICALS:  None FLUOROSCOPY: 11.43 mGy COMPARISON:  None FINDINGS: The patient was placed in prone position on the CT gantry. Radiopaque markers were placed on the patient's skin and initial imaging of the pelvis was performed. The patient's skin was then prepped and draped in the usual sterile fashion. Moderate sedation was provided for by the nursing staff under my supervision utilizing intravenous Versed  and fentanyl . The nurse had no other duties other than monitoring the patient and providing sedation during the procedure. I was present for the entire procedure. 1% lidocaine  was used to infiltrate the skin at the access site prior to a stab incision. Local anesthesia was then used to infiltrate the region of soft tissue from the skin to the left iliac bone. The needle was then advanced and CT imaging demonstrated the needle tip to be in the cortex of the left iliac bone. The bone was then penetrated and a sample was obtained. After the  sample was evaluated, approximately 5 mL of heparinized bone marrow sample was obtained by aspiration. A core sample was then obtained. The core sample appeared sufficient. All needles were then removed from the patient. Sterile dressing was applied. IMPRESSION: Satisfactory core needle biopsy and aspiration of the left iliac bone marrow under CT guidance. Electronically Signed   By: Susan Ensign   On: 03/26/2024 13:58   CT Angio Chest PE W/Cm &/Or Wo Cm Result Date: 03/21/2024 CLINICAL DATA:  Evaluate for acute pulmonary embolism. EXAM: CT ANGIOGRAPHY CHEST WITH CONTRAST TECHNIQUE: Multidetector CT imaging of the chest was performed using the standard protocol during bolus administration of intravenous contrast. Multiplanar CT image reconstructions and MIPs were obtained to evaluate the vascular anatomy. RADIATION DOSE REDUCTION: This exam was performed according to the departmental dose-optimization program which includes automated exposure control, adjustment of the mA and/or kV according to patient size and/or use of iterative reconstruction technique. CONTRAST:  75mL OMNIPAQUE  IOHEXOL  350 MG/ML SOLN COMPARISON:  06/11/2023 FINDINGS: Cardiovascular: Satisfactory opacification of the pulmonary arteries to the segmental level. No evidence of pulmonary embolism. Normal heart size. No pericardial effusion. Aortic atherosclerosis. The ascending thoracic aorta measures 4 cm, image 43/7. Coronary artery calcifications. Mediastinum/Nodes: Thyroid  gland, trachea, and esophagus appear normal. No mediastinal adenopathy. Right hilar node measures 1.5 cm, image 61/4. Lungs/Pleura: Emphysema with diffuse bronchial wall thickening. Within the anterior right upper lobe there is a patchy area of ground-glass and airspace density, image 54/5. Right upper lobe lung nodule measures 5 mm, image 38/5. Unchanged from the previous exam. This is been stable since 12/18/2022 compatible with a benign nodule. Peripheral nodule  within the right upper lobe measuring 2 mm is also unchanged from 12/28/2022. 4 mm nodule in the right lower lobe appears new, image 78/5. Upper Abdomen: No acute abnormality.  Cholecystectomy. Musculoskeletal: Thoracic degenerative disc disease. No acute or suspicious osseous findings. Review of the MIP images confirms the above findings. IMPRESSION: 1. No evidence for acute pulmonary embolism. 2. Patchy area of ground-glass and airspace density within the anterior right upper lobe is favored to represent an area of pneumonia. Recommend follow-up imaging to ensure resolution and to rule out underlying malignancy. 3. Right hilar lymphadenopathy is favored to be reactive. 4. New 4 mm nodule in the right lower lobe. If the patient is at high risk for bronchogenic carcinoma, follow-up chest CT at 1year is recommended. If the patient is at low risk, no follow-up is  needed. This recommendation follows the consensus statement: Guidelines for Management of Small Pulmonary Nodules Detected on CT Scans: A Statement from the Fleischner Society as published in Radiology 2005; 237:395-400. 5. Coronary artery calcifications. 6. 4 cm ascending thoracic aorta. Recommend annual imaging followup by CTA or MRA. This recommendation follows 2010 ACCF/AHA/AATS/ACR/ASA/SCA/SCAI/SIR/STS/SVM Guidelines for the Diagnosis and Management of Patients with Thoracic Aortic Disease. Circulation. 2010; 121: W295-A213. Aortic aneurysm NOS (ICD10-I71.9) 7. Aortic Atherosclerosis (ICD10-I70.0) and Emphysema (ICD10-J43.9). Electronically Signed   By: Kimberley Penman M.D.   On: 03/21/2024 06:44   DG Chest Portable 1 View Result Date: 03/21/2024 CLINICAL DATA:  Shortness of breath and hypoxia.  Chest pain. EXAM: PORTABLE CHEST 1 VIEW COMPARISON:  06/11/2023. FINDINGS: The heart size and mediastinal contours are within normal limits. Faint right upper lobe opacity is noted concerning for early pneumonia. Left lung appears clear. The visualized osseous  structures are intact. The visualized skeletal structures are unremarkable. IMPRESSION: Faint right upper lobe opacity concerning for early pneumonia. Electronically Signed   By: Kimberley Penman M.D.   On: 03/21/2024 06:34    ASSESSMENT: Squamous cell carcinoma of the scalp.  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 Bone marrow biopsy on 03/26/24 revealed hypercellular marrow with acute leukemic process displaying prominent monocytic features consistent with AML. Peripheral blood showed leukocytosis with monocytosis and circulating blasts. Cytogenetics pending. [facility] Myeloid panel has been sent. Today we discussed clinical presentation, diagnostics, and results including generalized information regarding diagnosis of AML. Recommend that he start hydrea  which helps to reduce WBCs prior to starting chemotherapy and help prevent complications of high wbc counts. Typically chemotherapy involves a combination of vidaza and venetoclax. Patient will follow up with Dr Adrian Alba next week for discussions of management.  Goals of care- we briefly reviewed options including supportive care vs treatment including chemotherapy. Unable to give prognosis or details of his disease currently pending finalized pathology, cytogenetics. Patient says that he is interested in pursuing treatment and that his goal is to live to 75 which is the age his father lived to.  Squamous cell carcinoma of the scalp: No evidence of disease. Previously had a 'castle test' which is a 40 gene expression profile to assess risk of metastatic disease for squamous cell carcinoma of the skin.  Patient was reported as a class 2a, which is moderate risk.  We discussed the likely areas of metastasis which would be painless lymphadenopathy on the right side of his neck. He declined imaging and is followed by dermatology.   I spent a total of 45 minutes reviewing chart data, face-to-face evaluation with the patient, counseling and coordination of  care as detailed above.  Patient expressed understanding and was in agreement with this plan. He also understands that He can call clinic at any time with any questions, concerns, or complaints.   Nelda Balsam, NP   03/27/2024

## 2024-03-27 NOTE — Progress Notes (Signed)
 Just spoke to Dr. Eino Gravel- s/o ACUTE MYELOID LEUKEMIA-to be signed out on Monday.  More than 20% blasts.  Sending out NGS.   Dr. Adrian Alba and Kenney Peacemaker informed.

## 2024-03-27 NOTE — Telephone Encounter (Signed)
 Wife called stating that he just got out of the hospital any is in wheelchair and uses oxygen.  The wife wanted to make sure that when they got here today for the labs that they would be in a wheelchair for him and oxygen while he is in the cancer center.  I told her yes for both of them and I did talk to [place] and she said she would get the oxygen when he comes in.

## 2024-03-28 ENCOUNTER — Other Ambulatory Visit: Payer: Self-pay | Admitting: Oncology

## 2024-03-28 MED ORDER — OXYCODONE HCL 5 MG PO TABS: 5.0000 mg | ORAL_TABLET | Freq: Three times a day (TID) | ORAL | 0 refills | Status: AC | PRN

## 2024-03-28 NOTE — Progress Notes (Signed)
 Pts wife called with significant leg pain making it difficult for him to walk since starting hydrea . I have sent him 5 days of oxycodone 5 mg q8 prn. He sees dr Adrian Alba next week. Please f/u on monday  Dr. Seretha Dance, MD, MPH [facility] at [facility] [phone] 03/28/2024 4:43 PM

## 2024-03-30 LAB — SURGICAL PATHOLOGY

## 2024-03-31 ENCOUNTER — Other Ambulatory Visit: Payer: Self-pay | Admitting: *Deleted

## 2024-03-31 ENCOUNTER — Encounter (INDEPENDENT_AMBULATORY_CARE_PROVIDER_SITE_OTHER): Payer: Self-pay

## 2024-03-31 DIAGNOSIS — C9200 Acute myeloblastic leukemia, not having achieved remission: Secondary | ICD-10-CM

## 2024-04-01 ENCOUNTER — Telehealth: Payer: Self-pay | Admitting: Pharmacist

## 2024-04-01 ENCOUNTER — Encounter: Payer: Medicare PPO | Admitting: Dermatology

## 2024-04-01 ENCOUNTER — Telehealth: Payer: Self-pay | Admitting: Cardiovascular Disease

## 2024-04-01 ENCOUNTER — Other Ambulatory Visit (HOSPITAL_COMMUNITY): Payer: Self-pay

## 2024-04-01 ENCOUNTER — Encounter: Payer: Self-pay | Admitting: Oncology

## 2024-04-01 ENCOUNTER — Inpatient Hospital Stay: Admitting: Oncology

## 2024-04-01 ENCOUNTER — Inpatient Hospital Stay

## 2024-04-01 ENCOUNTER — Other Ambulatory Visit: Payer: Self-pay

## 2024-04-01 VITALS — BP 90/57 | HR 70 | Temp 98.1°F | Resp 18 | Wt 156.0 lb

## 2024-04-01 DIAGNOSIS — C9200 Acute myeloblastic leukemia, not having achieved remission: Secondary | ICD-10-CM

## 2024-04-01 DIAGNOSIS — R531 Weakness: Secondary | ICD-10-CM

## 2024-04-01 LAB — CBC WITH DIFFERENTIAL/PLATELET
Abs Immature Granulocytes: 1.7 10*3/uL — ABNORMAL HIGH (ref 0.00–0.07)
Basophils Absolute: 0 10*3/uL (ref 0.0–0.1)
Basophils Relative: 0 %
Blasts: 13 %
Eosinophils Absolute: 0 10*3/uL (ref 0.0–0.5)
Eosinophils Relative: 0 %
HCT: 30.3 % — ABNORMAL LOW (ref 39.0–52.0)
Hemoglobin: 9.6 g/dL — ABNORMAL LOW (ref 13.0–17.0)
Lymphocytes Relative: 17 %
Lymphs Abs: 4 10*3/uL (ref 0.7–4.0)
MCH: 31.1 pg (ref 26.0–34.0)
MCHC: 31.7 g/dL (ref 30.0–36.0)
MCV: 98.1 fL (ref 80.0–100.0)
Metamyelocytes Relative: 4 %
Monocytes Absolute: 9.8 10*3/uL — ABNORMAL HIGH (ref 0.1–1.0)
Monocytes Relative: 41 %
Myelocytes: 2 %
Neutro Abs: 5.2 10*3/uL (ref 1.7–7.7)
Neutrophils Relative %: 22 %
Platelets: 97 10*3/uL — ABNORMAL LOW (ref 150–400)
Promyelocytes Relative: 1 %
RBC: 3.09 MIL/uL — ABNORMAL LOW (ref 4.22–5.81)
RDW: 15.9 % — ABNORMAL HIGH (ref 11.5–15.5)
Smear Review: NORMAL
WBC: 23.8 10*3/uL — ABNORMAL HIGH (ref 4.0–10.5)
nRBC: 0.2 % (ref 0.0–0.2)

## 2024-04-01 LAB — LACTATE DEHYDROGENASE: LDH: 449 U/L — ABNORMAL HIGH (ref 98–192)

## 2024-04-01 LAB — BASIC METABOLIC PANEL - CANCER CENTER ONLY
Anion gap: 11 (ref 5–15)
BUN: 28 mg/dL — ABNORMAL HIGH (ref 8–23)
CO2: 22 mmol/L (ref 22–32)
Calcium: 8.2 mg/dL — ABNORMAL LOW (ref 8.9–10.3)
Chloride: 101 mmol/L (ref 98–111)
Creatinine: 1.11 mg/dL (ref 0.61–1.24)
GFR, Estimated: >60 mL/min (ref >60)
Glucose, Bld: 116 mg/dL — ABNORMAL HIGH (ref 70–99)
Potassium: 3.5 mmol/L (ref 3.5–5.1)
Sodium: 134 mmol/L — ABNORMAL LOW (ref 135–145)

## 2024-04-01 LAB — PHOSPHORUS: Phosphorus: 3.3 mg/dL (ref 2.5–4.6)

## 2024-04-01 LAB — MAGNESIUM: Magnesium: 2.1 mg/dL (ref 1.7–2.4)

## 2024-04-01 LAB — PATHOLOGIST SMEAR REVIEW

## 2024-04-01 LAB — URIC ACID: Uric Acid, Serum: 9.1 mg/dL — ABNORMAL HIGH (ref 3.7–8.6)

## 2024-04-01 MED ORDER — MIRTAZAPINE 15 MG PO TABS: 15.0000 mg | ORAL_TABLET | Freq: Every day | ORAL | 2 refills | Status: DC

## 2024-04-01 MED ORDER — VENETOCLAX 100 MG PO TABS
400.0000 mg | ORAL_TABLET | Freq: Every day | ORAL | 0 refills | Status: DC
  Filled 2024-04-02: qty 120, 30d supply, fill #0

## 2024-04-01 MED ORDER — ONDANSETRON HCL 8 MG PO TABS: 8.0000 mg | ORAL_TABLET | Freq: Three times a day (TID) | ORAL | 2 refills | Status: DC | PRN

## 2024-04-01 MED ORDER — PROCHLORPERAZINE MALEATE 10 MG PO TABS: 10.0000 mg | ORAL_TABLET | Freq: Four times a day (QID) | ORAL | 2 refills | Status: DC | PRN

## 2024-04-01 NOTE — Telephone Encounter (Signed)
 Clinical Pharmacist Practitioner Encounter   Received new prescription for Venclexta (venetoclax) for the treatment of AML in conjunction with azacitidine, planned duration until disease progression or unacceptable drug toxicity. Planned start 04/13/24.   CMP/CBC from 04/01/24 assessed, no relevant lab abnormalities. Prescription dose and frequency assessed. '  Patient will be monitored to TLS and started on allopurinol for TLS prophylaxis.   Current medication list in Epic reviewed, no DDIs with venetoclax identified.   Evaluated chart and no patient barriers to medication adherence identified.   Prescription has been e-scribed to the [facility] for benefits analysis and approval.  Oral Oncology Clinic will continue to follow for insurance authorization, copayment issues, initial counseling and start date.  Patient agreed to treatment on 04/01/24 per MD documentation.  Dylan Weber N. Jamiel Goncalves, PharmD, BCOP, CPP Hematology/Oncology Clinical Pharmacist [facility] Oral Chemotherapy Navigation [facility] [phone]  04/01/2024 3:35 PM

## 2024-04-01 NOTE — Telephone Encounter (Signed)
 Called pharmacy regarding medication instructions. Instructions were current and accurate. One 5 mg tablet in the morning and once at night.

## 2024-04-01 NOTE — Progress Notes (Signed)
 Pt in for follow up, saw Lauren NP Friday after being discharged on Thursday 03/26/24.  Pt reports being weak, no appetite and has pain in both hips. Pt and wife states oxycodone  is too strong and tylenol  #3 from [facility] made patient sick on stomach.  Requesting something different and not as strong for pain.

## 2024-04-01 NOTE — Telephone Encounter (Signed)
 Pt c/o medication issue:  1. Name of Medication: bisoprolol  (ZEBETA ) 5 MG tablet   2. How are you currently taking this medication (dosage and times per day)? N/A  3. Are you having a reaction (difficulty breathing--STAT)? No   4. What is your medication issue? Pharmacy requesting the medication instructions be updated. Please advise

## 2024-04-01 NOTE — Progress Notes (Signed)
 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  Cytogenetics pending.  INTERVAL HISTORY: Patient is an 89 year old male who was recently admitted to the hospital with pneumonia and found to have significantly elevated white blood cell count with blasts in the periphery.  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  Patient continues to have shortness of breath requiring oxygen, but states this is improved.  He also has weakness and fatigue, but this is improved as well.  He has no neurologic complaints.  He denies any fevers.  He has a poor appetite.  He denies any chest pain, cough, or hemoptysis.  He has no nausea, vomiting, constipation, or diarrhea.  He has no urinary complaints.  Patient offers no further specific complaints today.  REVIEW OF SYSTEMS:   Review of Systems  Constitutional:  Positive for malaise/fatigue. Negative for chills and fever.  Respiratory:  Positive for shortness of breath. Negative for cough and hemoptysis.   Cardiovascular: Negative.  Negative for chest pain and leg swelling.  Gastrointestinal: Negative.  Negative for abdominal pain.  Genitourinary: Negative.  Negative for dysuria.  Musculoskeletal:  Positive for joint pain. Negative for back pain.  Skin: Negative.  Negative for rash.  Neurological:  Positive for weakness. Negative for dizziness, focal weakness and headaches.  Psychiatric/Behavioral:  The patient has insomnia.     As per HPI. Otherwise, a complete review of systems is negative.  PAST MEDICAL HISTORY: Past Medical History:  Diagnosis Date   Basal cell  carcinoma 06/19/2022   Dorsum nose. Nodular. EDC 07/17/2022   BCC (basal cell carcinoma) 10/11/2022   right nasal supratip, superficial and nodular, EDC 12/19/22   COPD (chronic obstructive pulmonary disease) (HCC)    Coronary artery calcification seen on CT scan 11/17/2021   COVID-19 10/2021   Diastolic dysfunction    a. 11/2021 Echo: EF 55-60%, no rwma, GrI DD, nl RV fxn. Mild AI.   GERD (gastroesophageal reflux disease)    Pulmonary embolism (HCC)    a. 11/2021 CTA Chest: small peripheral PE in RLL branch of PA-->Eliquis .   Right leg DVT (HCC)    a. 11/2021 LE U/S: occlusive R calf vein DVT in R peroneal vein-->eliquis .   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 [facility] 07/17/2022   SVT (supraventricular tachycardia) (HCC)    a. Dx 10/2021 in setting of COVID infxn.    PAST SURGICAL HISTORY: Past Surgical History:  Procedure Laterality Date   APPENDECTOMY     CHOLECYSTECTOMY     EYE SURGERY      FAMILY HISTORY: Family History  Problem Relation Age of Onset   Uterine cancer Mother    Brain cancer Sister     ADVANCED DIRECTIVES (Y/N):  N  HEALTH MAINTENANCE: Social History   Tobacco Use   Smoking status: Former    Types: Cigarettes   Smokeless tobacco: Former  Building services engineer status: Never Used  Substance Use Topics   Alcohol use: Yes    Alcohol/week: 7.0 standard drinks of alcohol    Types: 7  Shots of liquor per week   Drug use: Never     Colonoscopy:  PAP:  Bone density:  Lipid panel:  Allergies  Allergen Reactions   Pregabalin Anaphylaxis   Diltiazem  Other (See Comments)    Flushing/redness to body   Prednisone  Other (See Comments)    jittery   Meloxicam Nausea Only   Amoxicillin Rash   Sulfamethoxazole-Trimethoprim Rash    Current Outpatient Medications  Medication Sig Dispense Refill   acetaminophen  (TYLENOL ) 650 MG CR tablet Take  650 mg by mouth as needed for pain.     albuterol  (VENTOLIN  HFA) 108 (90 Base) MCG/ACT inhaler Inhale 2 puffs into the lungs every 6 (six) hours as needed for wheezing or shortness of breath. 8 g 0   bisoprolol  (ZEBETA ) 5 MG tablet TAKE 1 TABLET (5 MG TOTAL) BY MOUTH IN THE MORNING AND AT BEDTIME 180 tablet 3   ciclopirox  (LOPROX ) 0.77 % SUSP Apply to toenails QD-BID. 30 mL 11   COMBIVENT  RESPIMAT 20-100 MCG/ACT AERS respimat Inhale 1 puff into the lungs every 6 (six) hours as needed.     feeding supplement (ENSURE ENLIVE / ENSURE PLUS) LIQD Take 237 mLs by mouth 2 (two) times daily between meals. 237 mL 12   hydroxyurea  (HYDREA ) 500 MG capsule Take 1 capsule (500 mg total) by mouth daily. May take with food to minimize GI side effects. 30 capsule 0   mirtazapine (REMERON) 15 MG tablet Take 1 tablet (15 mg total) by mouth at bedtime. 30 tablet 2   Multiple Vitamin (MULTIVITAMIN) capsule Take 1 capsule by mouth daily.     mupirocin  ointment (BACTROBAN ) 2 % Apply 1 Application topically daily. 22 g 0   ondansetron  (ZOFRAN ) 8 MG tablet One pill every 8 hours as needed for nausea/vomitting. 40 tablet 1   pantoprazole  (PROTONIX ) 20 MG tablet Take 20 mg by mouth daily.     Probiotic Product (PROBIOTIC ADVANCED PO) Take by mouth daily.     acetaminophen -codeine  (TYLENOL  #3) 300-30 MG tablet Take 1 tablet by mouth every 8 (eight) hours as needed. (Patient not taking: Reported on 04/01/2024)     oxyCODONE  (OXY IR/ROXICODONE ) 5 MG immediate release tablet Take 1 tablet (5 mg total) by mouth every 8 (eight) hours as needed for up to 5 days for severe pain (pain score 7-10). (Patient not taking: Reported on 04/01/2024) 15 tablet 0   No current facility-administered medications for this visit.    OBJECTIVE: Vitals:   04/01/24 1441  BP: (!) 90/57  Pulse: 70  Resp: 18  Temp: 98.1 F (36.7 C)  SpO2: 94%     Body mass index is 23.04 kg/m.    ECOG FS:1 - Symptomatic but completely ambulatory  General:  Well-developed, well-nourished, no acute distress. Eyes: Pink conjunctiva, anicteric sclera. HEENT: Normocephalic, moist mucous membranes. Lungs: No audible wheezing or coughing. Heart: Regular rate and rhythm. Abdomen: Soft, nontender, no obvious distention. Musculoskeletal: No edema, cyanosis, or clubbing. Neuro: Alert, answering all questions appropriately. Cranial nerves grossly intact. Skin: No rashes or petechiae noted. Psych: Normal affect. Lymphatics: No cervical, calvicular, axillary or inguinal LAD.   LAB RESULTS:  Lab Results  Component Value Date   NA 134 (L) 04/01/2024   K 3.5 04/01/2024   CL 101 04/01/2024   CO2 22 04/01/2024   GLUCOSE 116 (H) 04/01/2024   BUN 28 (H) 04/01/2024   CREATININE 1.11 04/01/2024   CALCIUM 8.2 (L) 04/01/2024   PROT 8.5 (H) 03/27/2024   ALBUMIN 3.4 (L) 03/27/2024  AST 32 03/27/2024   ALT 31 03/27/2024   ALKPHOS 64 03/27/2024   BILITOT 0.5 03/27/2024   GFRNONAA >60 04/01/2024    Lab Results  Component Value Date   WBC 23.8 (H) 04/01/2024   NEUTROABS 5.2 04/01/2024   HGB 9.6 (L) 04/01/2024   HCT 30.3 (L) 04/01/2024   MCV 98.1 04/01/2024   PLT 97 (L) 04/01/2024     STUDIES: CT BONE MARROW BIOPSY & ASPIRATION Result Date: 03/26/2024 CLINICAL DATA:  Leukocytosis with mild glass on peripheral smear concerning for acute leukemia. EXAM: CT-guided bone marrow biopsy TECHNIQUE: CT pelvis CONTRAST:  None RADIOPHARMACEUTICALS:  None FLUOROSCOPY: 11.43 mGy COMPARISON:  None FINDINGS: The patient was placed in prone position on the CT gantry. Radiopaque markers were placed on the patient's skin and initial imaging of the pelvis was performed. The patient's skin was then prepped and draped in the usual sterile fashion. Moderate sedation was provided for by the nursing staff under my supervision utilizing intravenous Versed  and fentanyl . The nurse had no other duties other than monitoring the patient and providing sedation during the procedure.  I was present for the entire procedure. 1% lidocaine  was used to infiltrate the skin at the access site prior to a stab incision. Local anesthesia was then used to infiltrate the region of soft tissue from the skin to the left iliac bone. The needle was then advanced and CT imaging demonstrated the needle tip to be in the cortex of the left iliac bone. The bone was then penetrated and a sample was obtained. After the sample was evaluated, approximately 5 mL of heparinized bone marrow sample was obtained by aspiration. A core sample was then obtained. The core sample appeared sufficient. All needles were then removed from the patient. Sterile dressing was applied. IMPRESSION: Satisfactory core needle biopsy and aspiration of the left iliac bone marrow under CT guidance. Electronically Signed   By: Susan Ensign   On: 03/26/2024 13:58   CT Angio Chest PE W/Cm &/Or Wo Cm Result Date: 03/21/2024 CLINICAL DATA:  Evaluate for acute pulmonary embolism. EXAM: CT ANGIOGRAPHY CHEST WITH CONTRAST TECHNIQUE: Multidetector CT imaging of the chest was performed using the standard protocol during bolus administration of intravenous contrast. Multiplanar CT image reconstructions and MIPs were obtained to evaluate the vascular anatomy. RADIATION DOSE REDUCTION: This exam was performed according to the departmental dose-optimization program which includes automated exposure control, adjustment of the mA and/or kV according to patient size and/or use of iterative reconstruction technique. CONTRAST:  75mL OMNIPAQUE  IOHEXOL  350 MG/ML SOLN COMPARISON:  06/11/2023 FINDINGS: Cardiovascular: Satisfactory opacification of the pulmonary arteries to the segmental level. No evidence of pulmonary embolism. Normal heart size. No pericardial effusion. Aortic atherosclerosis. The ascending thoracic aorta measures 4 cm, image 43/7. Coronary artery calcifications. Mediastinum/Nodes: Thyroid  gland, trachea, and esophagus appear normal. No  mediastinal adenopathy. Right hilar node measures 1.5 cm, image 61/4. Lungs/Pleura: Emphysema with diffuse bronchial wall thickening. Within the anterior right upper lobe there is a patchy area of ground-glass and airspace density, image 54/5. Right upper lobe lung nodule measures 5 mm, image 38/5. Unchanged from the previous exam. This is been stable since 12/18/2022 compatible with a benign nodule. Peripheral nodule within the right upper lobe measuring 2 mm is also unchanged from 12/28/2022. 4 mm nodule in the right lower lobe appears new, image 78/5. Upper Abdomen: No acute abnormality.  Cholecystectomy. Musculoskeletal: Thoracic degenerative disc disease. No acute or suspicious osseous findings. Review of the MIP images confirms the above  findings. IMPRESSION: 1. No evidence for acute pulmonary embolism. 2. Patchy area of ground-glass and airspace density within the anterior right upper lobe is favored to represent an area of pneumonia. Recommend follow-up imaging to ensure resolution and to rule out underlying malignancy. 3. Right hilar lymphadenopathy is favored to be reactive. 4. New 4 mm nodule in the right lower lobe. If the patient is at high risk for bronchogenic carcinoma, follow-up chest CT at 1year is recommended. If the patient is at low risk, no follow-up is needed. This recommendation follows the consensus statement: Guidelines for Management of Small Pulmonary Nodules Detected on CT Scans: A Statement from the Fleischner Society as published in Radiology 2005; 237:395-400. 5. Coronary artery calcifications. 6. 4 cm ascending thoracic aorta. Recommend annual imaging followup by CTA or MRA. This recommendation follows 2010 ACCF/AHA/AATS/ACR/ASA/SCA/SCAI/SIR/STS/SVM Guidelines for the Diagnosis and Management of Patients with Thoracic Aortic Disease. Circulation. 2010; 121: Z610-R604. Aortic aneurysm NOS (ICD10-I71.9) 7. Aortic Atherosclerosis (ICD10-I70.0) and Emphysema (ICD10-J43.9). Electronically  Signed   By: Kimberley Penman M.D.   On: 03/21/2024 06:44   DG Chest Portable 1 View Result Date: 03/21/2024 CLINICAL DATA:  Shortness of breath and hypoxia.  Chest pain. EXAM: PORTABLE CHEST 1 VIEW COMPARISON:  06/11/2023. FINDINGS: The heart size and mediastinal contours are within normal limits. Faint right upper lobe opacity is noted concerning for early pneumonia. Left lung appears clear. The visualized osseous structures are intact. The visualized skeletal structures are unremarkable. IMPRESSION: Faint right upper lobe opacity concerning for early pneumonia. Electronically Signed   By: Kimberley Penman M.D.   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  Cytogenetics pending.  PLAN:    AML: Bone marrow biopsy pathology reviewed independently.  Cytogenetics are pending at time of dictation.  Patient was initiated on Hydrea  for cell count control.  Despite his advanced age, patient is highly interested in pursuing treatment with Vidaza and venetoclax.  Return to clinic on Monday, April 13, 2024 to initiate cycle 1, day 1 of treatment. Leukocytosis: Secondary to AML.  Improving with Hydrea . Anemia: Patient's hemoglobin is trended down to 9.6, monitor. Thrombocytopenia: Possibly related to Hydrea .  Patient's platelet count was previously within normal limits.  Monitor. Hyponatremia: Mild, monitor. Weakness and fatigue: Will arrange for home health with physical therapy. Shortness of breath: Continue home oxygen as needed. Insomnia: Patient was given a prescription for mirtazapine today. Poor appetite: Mirtazapine as above. Joint pain: Continue Tylenol  as needed.  Patient expressed understanding and was in agreement with this plan. He also understands that He can call clinic at any time with any questions, concerns, or complaints.    Cancer Staging  No matching staging information was found for the patient.   Shellie Dials, MD   04/01/2024 3:44  PM

## 2024-04-01 NOTE — Progress Notes (Signed)
 START ON PATHWAY REGIMEN - AML and APL     A cycle is every 28 days:     Venetoclax      Azacitidine   **Always confirm dose/schedule in your pharmacy ordering system**  Patient Characteristics: AML, Initial Induction Therapy, Not a Candidate for Intensive Induction Therapy or Lower Intensity Therapy Preferred, No Actionable Biomarkers Disease Subtype: AML Phase of Therapy: Initial Induction Therapy Preferred Therapy Intensity: Not a Candidate for Intensive Therapy Molecular Alteration Present: No Actionable Biomarkers Intent of Therapy: Non-Curative / Palliative Intent, Discussed with Patient

## 2024-04-02 ENCOUNTER — Other Ambulatory Visit: Payer: Self-pay

## 2024-04-02 ENCOUNTER — Other Ambulatory Visit (HOSPITAL_COMMUNITY): Payer: Self-pay

## 2024-04-02 ENCOUNTER — Telehealth: Payer: Self-pay | Admitting: Pharmacy Technician

## 2024-04-02 ENCOUNTER — Other Ambulatory Visit: Payer: Self-pay | Admitting: Pharmacy Technician

## 2024-04-02 ENCOUNTER — Encounter: Payer: Self-pay | Admitting: Oncology

## 2024-04-02 MED ORDER — ALLOPURINOL 300 MG PO TABS: 300.0000 mg | ORAL_TABLET | Freq: Every day | ORAL | 0 refills | Status: DC

## 2024-04-02 NOTE — Telephone Encounter (Signed)
 Oral Oncology Patient Advocate Encounter   Received notification that prior authorization for Venclexta is required.   PA submitted on 04/01/2024 Key [id number] Status is pending     [name], CPhT Oncology Pharmacy Patient Advocate [facility] Direct Number: [phone] Fax: [phone]

## 2024-04-02 NOTE — Telephone Encounter (Signed)
 Oral Oncology Patient Advocate Encounter   Was successful in securing patient a $ 4,000 grant from [employer] ([employer]) to provide copayment coverage for his Venclexta.  This will keep the out of pocket expense at $0.     The billing information is as follows and has been shared with [facility].   Member ID: [id number] Group ID: [id number] RxBin: [id number] Dates of Eligibility: 01/03/2024 through 04/02/2025  Fund:   AML ID [id number]  [name], CPhT Oncology Pharmacy Patient Advocate [facility] Direct Number: [phone] Fax: [phone]

## 2024-04-02 NOTE — Progress Notes (Signed)
 Specialty Pharmacy Initial Fill Coordination Note  Anthany Thornhill is a 89 y.o. male contacted today regarding refills of specialty medication(s) Venetoclax (VENCLEXTA) .  Patient requested Delivery  on 04/08/24  to verified address [address]  [place] [address]   Medication will be filled on 05/27.   Patient is aware of $0 copayment. Leisure centre manager.  [name], CPhT Oncology Pharmacy Patient Advocate [facility] Direct Number: [phone] Fax: [phone]

## 2024-04-02 NOTE — Telephone Encounter (Signed)
 Oral Oncology Patient Advocate Encounter  Prior Authorization for Venclexta has been approved.    PA# [id number] Effective dates: 11/13/2023 through 11/11/2024  Patients co-pay is $100.    [name], CPhT Oncology Pharmacy Patient Advocate [facility] Direct Number: [phone] Fax: [phone]

## 2024-04-02 NOTE — Progress Notes (Signed)
 Specialty Pharmacy Initiation Note   Dylan Weber is a 89 y.o. male who will be followed by the specialty pharmacy service for RxSp Oncology    Review of administration, indication, effectiveness, safety, potential side effects, storage/disposable, and missed dose instructions occurred today for patient's specialty medication(s) Venetoclax [facility])     Patient/Caregiver did not have any additional questions or concerns.   Patient's therapy is appropriate to: Initiate    Goals Addressed             This Visit's Progress    Achieve or maintain remission       Patient is initiating therapy. Patient will maintain adherence         Jennessy Sandridge N Ketrick Matney Specialty Pharmacist

## 2024-04-02 NOTE — Telephone Encounter (Signed)
 Patient successfully OnBoarded and drug education provided by pharmacist. Medication scheduled to be shipped on 05/27 for delivery on 05/28 from [facility] to patient's address. Patient also knows to call me at [phone] with any questions or concerns regarding receiving medication or if there is any unexpected change in co-pay.   [name], CPhT Oncology Pharmacy Patient Advocate [facility] Direct Number: [phone] Fax: [phone]

## 2024-04-03 ENCOUNTER — Other Ambulatory Visit: Payer: Self-pay

## 2024-04-05 ENCOUNTER — Telehealth: Payer: Self-pay | Admitting: Oncology

## 2024-04-05 NOTE — Telephone Encounter (Signed)
 Patient's wife called and reports that pt has decreased urination despite drinking plenty of water. This happens after he starts on Hydrea . He has no difficult passing urine.  I recommend patient to hold off Hydrea  and go to ER today to get evaluation of kidney function is ok. Wife voices understanding.

## 2024-04-07 ENCOUNTER — Encounter: Admitting: Hospice and Palliative Medicine

## 2024-04-07 ENCOUNTER — Inpatient Hospital Stay

## 2024-04-07 ENCOUNTER — Other Ambulatory Visit: Payer: Self-pay

## 2024-04-07 ENCOUNTER — Other Ambulatory Visit: Payer: Self-pay | Admitting: *Deleted

## 2024-04-07 ENCOUNTER — Telehealth: Payer: Self-pay | Admitting: *Deleted

## 2024-04-07 ENCOUNTER — Other Ambulatory Visit

## 2024-04-07 DIAGNOSIS — C9200 Acute myeloblastic leukemia, not having achieved remission: Secondary | ICD-10-CM

## 2024-04-07 DIAGNOSIS — R339 Retention of urine, unspecified: Secondary | ICD-10-CM

## 2024-04-07 NOTE — Telephone Encounter (Signed)
 Per Nils Baseman, RN patient needed to see Jerrilyn Moras, NP for urinary retention. Patient left clinic s/p chemo education class. I reached out to patient and pt's wife. Pt/wife stated that they were not aware of the add on. Wife agreeable to come tomorrow am. Pt voiced that he was not overly concerned about the output and that he noted some improvement in the urinary retention. Per v/o Josh - pt needs labs (CBC, CMP and UA/culture).

## 2024-04-07 NOTE — Progress Notes (Signed)
 cbc

## 2024-04-08 ENCOUNTER — Encounter: Payer: Self-pay | Admitting: Oncology

## 2024-04-08 ENCOUNTER — Ambulatory Visit
Admission: RE | Admit: 2024-04-08 | Discharge: 2024-04-08 | Disposition: A | Discharge status: Home / Self Care | Source: Ambulatory Visit | Attending: Hospice and Palliative Medicine | Admitting: Hospice and Palliative Medicine

## 2024-04-08 ENCOUNTER — Inpatient Hospital Stay

## 2024-04-08 ENCOUNTER — Ambulatory Visit

## 2024-04-08 ENCOUNTER — Telehealth: Payer: Self-pay | Admitting: *Deleted

## 2024-04-08 ENCOUNTER — Encounter (HOSPITAL_COMMUNITY): Payer: Self-pay | Admitting: Internal Medicine

## 2024-04-08 ENCOUNTER — Encounter (HOSPITAL_COMMUNITY): Payer: Self-pay

## 2024-04-08 ENCOUNTER — Other Ambulatory Visit: Payer: Self-pay

## 2024-04-08 ENCOUNTER — Encounter: Payer: Self-pay | Admitting: Hospice and Palliative Medicine

## 2024-04-08 ENCOUNTER — Ambulatory Visit (HOSPITAL_BASED_OUTPATIENT_CLINIC_OR_DEPARTMENT_OTHER): Admitting: Hospice and Palliative Medicine

## 2024-04-08 VITALS — BP 109/87 | HR 85 | Temp 98.8°F | Resp 18 | Ht 69.0 in | Wt 155.0 lb

## 2024-04-08 DIAGNOSIS — C9200 Acute myeloblastic leukemia, not having achieved remission: Secondary | ICD-10-CM | POA: Diagnosis not present

## 2024-04-08 DIAGNOSIS — R079 Chest pain, unspecified: Secondary | ICD-10-CM | POA: Diagnosis not present

## 2024-04-08 DIAGNOSIS — Z8701 Personal history of pneumonia (recurrent): Secondary | ICD-10-CM

## 2024-04-08 DIAGNOSIS — R0602 Shortness of breath: Secondary | ICD-10-CM | POA: Diagnosis not present

## 2024-04-08 DIAGNOSIS — R0600 Dyspnea, unspecified: Secondary | ICD-10-CM

## 2024-04-08 DIAGNOSIS — E860 Dehydration: Secondary | ICD-10-CM

## 2024-04-08 DIAGNOSIS — R339 Retention of urine, unspecified: Secondary | ICD-10-CM

## 2024-04-08 DIAGNOSIS — M549 Dorsalgia, unspecified: Secondary | ICD-10-CM

## 2024-04-08 LAB — CBC WITH DIFFERENTIAL/PLATELET
Abs Immature Granulocytes: 4 10*3/uL — ABNORMAL HIGH (ref 0.00–0.07)
Basophils Absolute: 0 10*3/uL (ref 0.0–0.1)
Basophils Relative: 0 %
Blasts: 13 %
Eosinophils Absolute: 0 10*3/uL (ref 0.0–0.5)
Eosinophils Relative: 0 %
HCT: 30.2 % — ABNORMAL LOW (ref 39.0–52.0)
Hemoglobin: 9.2 g/dL — ABNORMAL LOW (ref 13.0–17.0)
Lymphocytes Relative: 11 %
Lymphs Abs: 7.3 10*3/uL — ABNORMAL HIGH (ref 0.7–4.0)
MCH: 30.7 pg (ref 26.0–34.0)
MCHC: 30.5 g/dL (ref 30.0–36.0)
MCV: 100.7 fL — ABNORMAL HIGH (ref 80.0–100.0)
Metamyelocytes Relative: 2 %
Monocytes Absolute: 39.7 10*3/uL — ABNORMAL HIGH (ref 0.1–1.0)
Monocytes Relative: 60 %
Myelocytes: 4 %
Neutro Abs: 6.6 10*3/uL (ref 1.7–7.7)
Neutrophils Relative %: 10 %
Platelets: 136 10*3/uL — ABNORMAL LOW (ref 150–400)
RBC: 3 MIL/uL — ABNORMAL LOW (ref 4.22–5.81)
RDW: 15.9 % — ABNORMAL HIGH (ref 11.5–15.5)
Smear Review: NORMAL
WBC: 66.1 10*3/uL (ref 4.0–10.5)
nRBC: 1 % — ABNORMAL HIGH (ref 0.0–0.2)

## 2024-04-08 LAB — CMP (CANCER CENTER ONLY)
ALT: 24 U/L (ref 0–44)
AST: 35 U/L (ref 15–41)
Albumin: 3.3 g/dL — ABNORMAL LOW (ref 3.5–5.0)
Alkaline Phosphatase: 59 U/L (ref 38–126)
Anion gap: 12 (ref 5–15)
BUN: 24 mg/dL — ABNORMAL HIGH (ref 8–23)
CO2: 24 mmol/L (ref 22–32)
Calcium: 8.6 mg/dL — ABNORMAL LOW (ref 8.9–10.3)
Chloride: 100 mmol/L (ref 98–111)
Creatinine: 1.5 mg/dL — ABNORMAL HIGH (ref 0.61–1.24)
GFR, Estimated: 45 mL/min — ABNORMAL LOW (ref >60)
Glucose, Bld: 139 mg/dL — ABNORMAL HIGH (ref 70–99)
Potassium: 3.7 mmol/L (ref 3.5–5.1)
Sodium: 136 mmol/L (ref 135–145)
Total Bilirubin: 0.5 mg/dL (ref 0.0–1.2)
Total Protein: 8.3 g/dL — ABNORMAL HIGH (ref 6.5–8.1)

## 2024-04-08 MED ORDER — HYDROCODONE-ACETAMINOPHEN 5-325 MG PO TABS
1.0000 | ORAL_TABLET | Freq: Once | ORAL | Status: AC
  Administered 2024-04-08: 1 via ORAL
  Filled 2024-04-08: qty 1

## 2024-04-08 MED ORDER — SODIUM CHLORIDE 0.9 % IV SOLN
INTRAVENOUS | Status: AC
  Filled 2024-04-08 (×2): qty 250

## 2024-04-08 MED ORDER — HYDROCODONE-ACETAMINOPHEN 5-325 MG PO TABS: 1.0000 | ORAL_TABLET | Freq: Three times a day (TID) | ORAL | 0 refills | Status: DC | PRN

## 2024-04-08 NOTE — Progress Notes (Signed)
 Pharmacist Chemotherapy Monitoring - Initial Assessment    Anticipated start date: 04/13/24   The following has been reviewed per standard work regarding the patient's treatment regimen: The patient's diagnosis, treatment plan and drug doses, and organ/hematologic function Lab orders and baseline tests specific to treatment regimen  The treatment plan start date, drug sequencing, and pre-medications Prior authorization status  Patient's documented medication list, including drug-drug interaction screen and prescriptions for anti-emetics and supportive care specific to the treatment regimen The drug concentrations, fluid compatibility, administration routes, and timing of the medications to be used The patient's access for treatment and lifetime cumulative dose history, if applicable  The patient's medication allergies and previous infusion related reactions, if applicable   Changes made to treatment plan:  N/A  Follow up needed:  N/A   Glendora Landsman, PharmD, BCPS Clinical Pharmacist   04/08/2024  3:25 PM

## 2024-04-08 NOTE — Progress Notes (Signed)
 Symptom Management Clinic [facility] [phone] Fax:([phone]) [phone]  Patient Care Team: Will Hare, NP as PCP - General (Family Medicine) Devorah Fonder, MD as PCP - Cardiology (Cardiology) Shellie Dials, MD as Consulting Physician (Oncology)   NAME OF PATIENT: Dylan Weber  [id number]  03/19/1933   DATE OF VISIT: 04/08/24  REASON FOR CONSULT: Dylan Weber is a 89 y.o. male with multiple medical problems including COPD on intermittent O2, history of PE not on anticoagulation, SVT, AML.   INTERVAL HISTORY: Patient hospitalized 03/21/2024 to 03/26/2024 with acute on chronic hypoxic respiratory failure.  Patient was having intermittent chest pain.  CTA showed right upper lobe pneumonia.  Patient on treatment with hydroxyurea .   Patient presents to [facility] today for evaluation of right upper back pain.  Patient states that he started having intermittent sharp and intense right upper back pain yesterday.  He states that it occasionally hurts with deep breathing.  Denies fever or chills.  Denies injury.  Denies cough.  No chest pain.  He has had more shortness of breath recently and is wearing his O2.  Patient did have loose stools yesterday but states that he is subsequently had a formed stool.  Denies urinary complaints.  Denies any neurologic complaints. Patient offers no further specific complaints today.   PAST MEDICAL HISTORY: Past Medical History:  Diagnosis Date   Basal cell carcinoma 06/19/2022   Dorsum nose. Nodular. EDC 07/17/2022   BCC (basal cell carcinoma) 10/11/2022   right nasal supratip, superficial and nodular, EDC 12/19/22   COPD (chronic obstructive pulmonary disease) (HCC)    Coronary artery calcification seen on CT scan 11/17/2021   COVID-19 10/2021   Diastolic dysfunction    a. 11/2021 Echo: EF 55-60%, no rwma, GrI DD, nl RV fxn. Mild AI.   GERD (gastroesophageal reflux disease)    Pulmonary  embolism (HCC)    a. 11/2021 CTA Chest: small peripheral PE in RLL branch of PA-->Eliquis .   Right leg DVT (HCC)    a. 11/2021 LE U/S: occlusive R calf vein DVT in R peroneal vein-->eliquis .   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 [facility] 07/17/2022   SVT (supraventricular tachycardia) (HCC)    a. Dx 10/2021 in setting of COVID infxn.    PAST SURGICAL HISTORY:  Past Surgical History:  Procedure Laterality Date   APPENDECTOMY     CHOLECYSTECTOMY     EYE SURGERY      HEMATOLOGY/ONCOLOGY HISTORY:  Oncology History  AML (acute myeloid leukemia) (HCC)  04/01/2024 Initial Diagnosis   AML (acute myeloid leukemia) (HCC)   04/13/2024 -  Chemotherapy   Patient is on Treatment Plan : MYELODYSPLASIA  Azacitidine SQ D1-5 q28d       ALLERGIES:  is allergic to pregabalin, diltiazem , prednisone , meloxicam, amoxicillin, and sulfamethoxazole-trimethoprim.  MEDICATIONS:  Current Outpatient Medications  Medication Sig Dispense Refill   acetaminophen  (TYLENOL ) 650 MG CR tablet Take 650 mg by mouth as needed for pain.     acetaminophen -codeine  (TYLENOL  #3) 300-30 MG tablet Take 1 tablet by mouth every 8 (eight) hours as needed. (Patient not taking: Reported on 04/01/2024)     albuterol  (VENTOLIN  HFA) 108 (90 Base) MCG/ACT inhaler Inhale 2 puffs into the lungs every 6 (six) hours as needed for wheezing or shortness of breath. 8 g 0   allopurinol (ZYLOPRIM) 300 MG  tablet Take 1 tablet (300 mg total) by mouth daily. Start on 04/09/24. 30 tablet 0   bisoprolol  (ZEBETA ) 5 MG tablet TAKE 1 TABLET (5 MG TOTAL) BY MOUTH IN THE MORNING AND AT BEDTIME 180 tablet 3   ciclopirox  (LOPROX ) 0.77 % SUSP Apply to toenails QD-BID. 30 mL 11   COMBIVENT  RESPIMAT 20-100 MCG/ACT AERS respimat Inhale 1 puff into the lungs every 6 (six) hours as needed.     feeding supplement (ENSURE ENLIVE / ENSURE PLUS) LIQD Take  237 mLs by mouth 2 (two) times daily between meals. 237 mL 12   hydroxyurea  (HYDREA ) 500 MG capsule Take 1 capsule (500 mg total) by mouth daily. May take with food to minimize GI side effects. 30 capsule 0   mirtazapine  (REMERON ) 15 MG tablet Take 1 tablet (15 mg total) by mouth at bedtime. 30 tablet 2   Multiple Vitamin (MULTIVITAMIN) capsule Take 1 capsule by mouth daily.     mupirocin  ointment (BACTROBAN ) 2 % Apply 1 Application topically daily. 22 g 0   ondansetron  (ZOFRAN ) 8 MG tablet One pill every 8 hours as needed for nausea/vomitting. 40 tablet 1   ondansetron  (ZOFRAN ) 8 MG tablet Take 1 tablet (8 mg total) by mouth every 8 (eight) hours as needed for nausea or vomiting. 60 tablet 2   pantoprazole  (PROTONIX ) 20 MG tablet Take 20 mg by mouth daily.     Probiotic Product (PROBIOTIC ADVANCED PO) Take by mouth daily.     prochlorperazine  (COMPAZINE ) 10 MG tablet Take 1 tablet (10 mg total) by mouth every 6 (six) hours as needed for nausea or vomiting. 60 tablet 2   venetoclax  (VENCLEXTA ) 100 MG tablet Take 4 tablets (400 mg total) by mouth daily. Tablets should be swallowed whole with a meal and a full glass of water. 120 tablet 0   No current facility-administered medications for this visit.    VITAL SIGNS: BP 109/87   Pulse 85   Temp 98.8 F (37.1 C) (Oral)   Resp 18  There were no vitals filed for this visit.  Estimated body mass index is 23.04 kg/m as calculated from the following:   Height as of 03/26/24: 5\' 9"  (1.753 m).   Weight as of 04/01/24: 156 lb (70.8 kg).  LABS: CBC:    Component Value Date/Time   WBC 23.8 (H) 04/01/2024 1402   HGB 9.6 (L) 04/01/2024 1402   HGB 10.9 (L) 03/27/2024 1345   HCT 30.3 (L) 04/01/2024 1402   PLT 97 (L) 04/01/2024 1402   PLT 159 03/27/2024 1345   MCV 98.1 04/01/2024 1402   NEUTROABS 5.2 04/01/2024 1402   LYMPHSABS 4.0 04/01/2024 1402   MONOABS 9.8 (H) 04/01/2024 1402   EOSABS 0.0 04/01/2024 1402   BASOSABS 0.0 04/01/2024 1402    Comprehensive Metabolic Panel:    Component Value Date/Time   NA 134 (L) 04/01/2024 1402   K 3.5 04/01/2024 1402   CL 101 04/01/2024 1402   CO2 22 04/01/2024 1402   BUN 28 (H) 04/01/2024 1402   CREATININE 1.11 04/01/2024 1402   GLUCOSE 116 (H) 04/01/2024 1402   CALCIUM 8.2 (L) 04/01/2024 1402   AST 32 03/27/2024 1345   ALT 31 03/27/2024 1345   ALKPHOS 64 03/27/2024 1345   BILITOT 0.5 03/27/2024 1345   PROT 8.5 (H) 03/27/2024 1345   ALBUMIN 3.4 (L) 03/27/2024 1345    RADIOGRAPHIC STUDIES: CT BONE MARROW BIOPSY & ASPIRATION Result Date: 03/26/2024 CLINICAL DATA:  Leukocytosis with mild glass on peripheral  smear concerning for acute leukemia. EXAM: CT-guided bone marrow biopsy TECHNIQUE: CT pelvis CONTRAST:  None RADIOPHARMACEUTICALS:  None FLUOROSCOPY: 11.43 mGy COMPARISON:  None FINDINGS: The patient was placed in prone position on the CT gantry. Radiopaque markers were placed on the patient's skin and initial imaging of the pelvis was performed. The patient's skin was then prepped and draped in the usual sterile fashion. Moderate sedation was provided for by the nursing staff under my supervision utilizing intravenous Versed  and fentanyl . The nurse had no other duties other than monitoring the patient and providing sedation during the procedure. I was present for the entire procedure. 1% lidocaine  was used to infiltrate the skin at the access site prior to a stab incision. Local anesthesia was then used to infiltrate the region of soft tissue from the skin to the left iliac bone. The needle was then advanced and CT imaging demonstrated the needle tip to be in the cortex of the left iliac bone. The bone was then penetrated and a sample was obtained. After the sample was evaluated, approximately 5 mL of heparinized bone marrow sample was obtained by aspiration. A core sample was then obtained. The core sample appeared sufficient. All needles were then removed from the patient. Sterile dressing  was applied. IMPRESSION: Satisfactory core needle biopsy and aspiration of the left iliac bone marrow under CT guidance. Electronically Signed   By: Susan Ensign   On: 03/26/2024 13:58   CT Angio Chest PE W/Cm &/Or Wo Cm Result Date: 03/21/2024 CLINICAL DATA:  Evaluate for acute pulmonary embolism. EXAM: CT ANGIOGRAPHY CHEST WITH CONTRAST TECHNIQUE: Multidetector CT imaging of the chest was performed using the standard protocol during bolus administration of intravenous contrast. Multiplanar CT image reconstructions and MIPs were obtained to evaluate the vascular anatomy. RADIATION DOSE REDUCTION: This exam was performed according to the departmental dose-optimization program which includes automated exposure control, adjustment of the mA and/or kV according to patient size and/or use of iterative reconstruction technique. CONTRAST:  75mL OMNIPAQUE  IOHEXOL  350 MG/ML SOLN COMPARISON:  06/11/2023 FINDINGS: Cardiovascular: Satisfactory opacification of the pulmonary arteries to the segmental level. No evidence of pulmonary embolism. Normal heart size. No pericardial effusion. Aortic atherosclerosis. The ascending thoracic aorta measures 4 cm, image 43/7. Coronary artery calcifications. Mediastinum/Nodes: Thyroid  gland, trachea, and esophagus appear normal. No mediastinal adenopathy. Right hilar node measures 1.5 cm, image 61/4. Lungs/Pleura: Emphysema with diffuse bronchial wall thickening. Within the anterior right upper lobe there is a patchy area of ground-glass and airspace density, image 54/5. Right upper lobe lung nodule measures 5 mm, image 38/5. Unchanged from the previous exam. This is been stable since 12/18/2022 compatible with a benign nodule. Peripheral nodule within the right upper lobe measuring 2 mm is also unchanged from 12/28/2022. 4 mm nodule in the right lower lobe appears new, image 78/5. Upper Abdomen: No acute abnormality.  Cholecystectomy. Musculoskeletal: Thoracic degenerative disc  disease. No acute or suspicious osseous findings. Review of the MIP images confirms the above findings. IMPRESSION: 1. No evidence for acute pulmonary embolism. 2. Patchy area of ground-glass and airspace density within the anterior right upper lobe is favored to represent an area of pneumonia. Recommend follow-up imaging to ensure resolution and to rule out underlying malignancy. 3. Right hilar lymphadenopathy is favored to be reactive. 4. New 4 mm nodule in the right lower lobe. If the patient is at high risk for bronchogenic carcinoma, follow-up chest CT at 1year is recommended. If the patient is at low risk, no follow-up is  needed. This recommendation follows the consensus statement: Guidelines for Management of Small Pulmonary Nodules Detected on CT Scans: A Statement from the Fleischner Society as published in Radiology 2005; 237:395-400. 5. Coronary artery calcifications. 6. 4 cm ascending thoracic aorta. Recommend annual imaging followup by CTA or MRA. This recommendation follows 2010 ACCF/AHA/AATS/ACR/ASA/SCA/SCAI/SIR/STS/SVM Guidelines for the Diagnosis and Management of Patients with Thoracic Aortic Disease. Circulation. 2010; 121: W098-J191. Aortic aneurysm NOS (ICD10-I71.9) 7. Aortic Atherosclerosis (ICD10-I70.0) and Emphysema (ICD10-J43.9). Electronically Signed   By: Kimberley Penman M.D.   On: 03/21/2024 06:44   DG Chest Portable 1 View Result Date: 03/21/2024 CLINICAL DATA:  Shortness of breath and hypoxia.  Chest pain. EXAM: PORTABLE CHEST 1 VIEW COMPARISON:  06/11/2023. FINDINGS: The heart size and mediastinal contours are within normal limits. Faint right upper lobe opacity is noted concerning for early pneumonia. Left lung appears clear. The visualized osseous structures are intact. The visualized skeletal structures are unremarkable. IMPRESSION: Faint right upper lobe opacity concerning for early pneumonia. Electronically Signed   By: Kimberley Penman M.D.   On: 03/21/2024 06:34     PERFORMANCE STATUS (ECOG) : 2 - Symptomatic, <50% confined to bed  Review of Systems Unless otherwise noted, a complete review of systems is negative.  Physical Exam General: NAD Cardiovascular: regular rate and rhythm Pulmonary: clear anterior/posterior fields Abdomen: soft, nontender, + bowel sounds GU: no suprapubic tenderness Extremities: no edema, no joint deformities Skin: no rashes Neurological: Weakness but otherwise nonfocal  IMPRESSION/PLAN: AML -on hydroxyurea .  Patient has planned follow-up with Dr. Adrian Alba next week to start venetoclax  and Vidaza  Right upper back pain -no rashes.  No focal tenderness to palpation.  This could be musculoskeletal.  However, with recent right sided pneumonia and increased shortness of breath, will obtain chest x-ray for further evaluation.  Patient does have remote history of PE and is not anticoagulated.  However no chest pain, hypoxia or tachycardia make acute PE less likely.  Patient did also have recent CTA.  Will start Norco as needed for pain.  ED triggers reviewed in detail.  AKI - patient denies urinary complaints. Did not want to provide urine sample today. Proceed with gentle IVFs.   Wrist injury - L. wrist splint. Patient has follow up with ortho today.   Case and plan discussed with Dr. Adrian Alba.   Patient expressed understanding and was in agreement with this plan. He also understands that He can call clinic at any time with any questions, concerns, or complaints.   Thank you for allowing me to participate in the care of this very pleasant patient.   Time Total: 20 minutes  Visit consisted of counseling and education dealing with the complex and emotionally intense issues of symptom management in the setting of serious illness.Greater than 50%  of this time was spent counseling and coordinating care related to the above assessment and plan.  Signed by: Gerilyn Kobus, PhD, NP-C

## 2024-04-08 NOTE — Telephone Encounter (Signed)
 Discussed chest xray report w/pt's wife before pt left clinic today. Cxr stable and no signs of pneumonia. Pt/wife understand that if pt's pain persist and does not get any better to call our office back. Per Josh, if pain persist, pt may require a ct scan of chest.

## 2024-04-08 NOTE — Progress Notes (Signed)
 0914-04/08/24 - Critical wbc count 66.1 reported by Burdette Carolin in cancer center lab. Read back process performed -   0915 am - Provided critical value results to Josh, np- APP in [facility] to see patient.  1018-per Kim R in cancer ctr lab- pt has another Critical value - Blast of 13. Read back process performed with lab tech. Jerrilyn Moras- NP informed for critical value. Read back process performed w/NP.

## 2024-04-09 ENCOUNTER — Telehealth: Payer: Self-pay | Admitting: *Deleted

## 2024-04-09 ENCOUNTER — Inpatient Hospital Stay

## 2024-04-09 ENCOUNTER — Encounter: Payer: Self-pay | Admitting: Oncology

## 2024-04-09 NOTE — Telephone Encounter (Signed)
 If calling say that she had heard that the chest x-ray done nothing to stand out just still continues with his emphysema.  Dr. Adrian Alba did not think bit there since that time for a scan yet.  But he should be going back to his pulmonary doctor and see what they can do to help with him about the shortness of breath.  Did ask if the patient was having chest pain and he is not.  The wife did call over to the pulmonary doctor but they will not be able to see him until  mid June

## 2024-04-09 NOTE — Progress Notes (Signed)
 [facility] Clinical Social Work  Initial Assessment   Dylan Weber is a 89 y.o. year old male contacted caregiver by phone. Clinical Social Work was referred by DISTRESS SCREENING for assessment of psychosocial needs.   SDOH (Social Determinants of Health) assessments performed: No SDOH Interventions    Flowsheet Row Office Visit from 07/16/2023 in [facility] A Dept Of [facility]  SDOH Interventions   Food Insecurity Interventions Intervention Not Indicated  Housing Interventions Intervention Not Indicated  Transportation Interventions Intervention Not Indicated  Utilities Interventions Intervention Not Indicated  Financial Strain Interventions Intervention Not Indicated       SDOH Screenings   Food Insecurity: No Food Insecurity (03/21/2024)  Housing: Unknown (03/21/2024)  Transportation Needs: No Transportation Needs (03/21/2024)  Utilities: Not At Risk (03/21/2024)  Depression (PHQ2-9): Low Risk  (07/16/2023)  Financial Resource Strain: Low Risk  (12/02/2023)   Received from [facility]  Social Connections: Unknown (03/21/2024)  Tobacco Use: Medium Risk (04/08/2024)     Distress Screen completed: No    04/07/2024    2:42 PM  ONCBCN DISTRESS SCREENING  How much distress have you been experiencing in the past week? (0-10) 7  Emotional concerns type Worry or anxiety  Physical Concerns Type  Sleep;Fatigue;Memory or concentration;Changes in eating   Patient has been experiencing SOB and pain contributing to distress score.    Family/Social Information:  Housing Arrangement: patient lives with spouse. Family members/support persons in your life? Neighbors Transportation concerns: Patient's spouse will be transporting him to his appointments.  Employment: Retired .  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 yes Concerns about diagnosis and/or treatment: The potential side effects of treatment. Distinguishing between side effects of treatment or effects of the tumor.  Patient reported stressors: Adjusting to my illness Patient enjoys Patient enjoyed playing Golf. Has not been able to do so in a couple of years. Current coping skills/ strengths: General fund of knowledge , Religious Affiliation , and Supportive family/friends     SUMMARY: Current SDOH Barriers:  No SDOH barriers reported.  Clinical Social Work Clinical Goal(s):  No clinical social work goals at this time  Interventions: Discussed common feeling and emotions when being diagnosed with cancer, and the importance of support during treatment Informed patient of the support team roles and support services at [facility] Provided CSW contact information and encouraged patient to call with any questions or concerns CSW addressed distress screening score. Patient's spouse described physical pain and SOB that has been effecting patient. CSW provided emotional support as patient's spouse discussed effects on patient. Covering CSW will provide contact to primary CSW.   Follow Up Plan: Patient will contact CSW with any support or resource needs Patient verbalizes understanding of plan: Yes   Maudie Sorrow, LCSW Clinical Social Worker [facility]

## 2024-04-10 ENCOUNTER — Emergency Department

## 2024-04-10 ENCOUNTER — Inpatient Hospital Stay

## 2024-04-10 ENCOUNTER — Encounter: Payer: Self-pay | Admitting: Internal Medicine

## 2024-04-10 ENCOUNTER — Other Ambulatory Visit: Payer: Self-pay

## 2024-04-10 ENCOUNTER — Inpatient Hospital Stay
Admit: 2024-04-10 | Discharge: 2024-04-10 | Disposition: A | Discharge status: Home / Self Care | Attending: Internal Medicine | Admitting: Internal Medicine

## 2024-04-10 ENCOUNTER — Telehealth: Payer: Self-pay | Admitting: *Deleted

## 2024-04-10 ENCOUNTER — Encounter (HOSPITAL_COMMUNITY): Payer: Self-pay | Admitting: Internal Medicine

## 2024-04-10 ENCOUNTER — Inpatient Hospital Stay
Admission: EM | Admit: 2024-04-10 | Discharge: 2024-04-12 | DRG: 834 | Disposition: A | Discharge status: Other Acute Inpatient Hospital | Attending: Internal Medicine | Admitting: Internal Medicine

## 2024-04-10 DIAGNOSIS — Z8616 Personal history of COVID-19: Secondary | ICD-10-CM

## 2024-04-10 DIAGNOSIS — Z85828 Personal history of other malignant neoplasm of skin: Secondary | ICD-10-CM | POA: Diagnosis not present

## 2024-04-10 DIAGNOSIS — R0902 Hypoxemia: Secondary | ICD-10-CM | POA: Diagnosis present

## 2024-04-10 DIAGNOSIS — Z1152 Encounter for screening for COVID-19: Secondary | ICD-10-CM | POA: Diagnosis not present

## 2024-04-10 DIAGNOSIS — E872 Acidosis, unspecified: Secondary | ICD-10-CM | POA: Diagnosis not present

## 2024-04-10 DIAGNOSIS — R7989 Other specified abnormal findings of blood chemistry: Secondary | ICD-10-CM

## 2024-04-10 DIAGNOSIS — K219 Gastro-esophageal reflux disease without esophagitis: Secondary | ICD-10-CM | POA: Diagnosis present

## 2024-04-10 DIAGNOSIS — I4891 Unspecified atrial fibrillation: Secondary | ICD-10-CM | POA: Diagnosis present

## 2024-04-10 DIAGNOSIS — R079 Chest pain, unspecified: Secondary | ICD-10-CM | POA: Diagnosis present

## 2024-04-10 DIAGNOSIS — I2510 Atherosclerotic heart disease of native coronary artery without angina pectoris: Secondary | ICD-10-CM | POA: Diagnosis present

## 2024-04-10 DIAGNOSIS — Z8049 Family history of malignant neoplasm of other genital organs: Secondary | ICD-10-CM

## 2024-04-10 DIAGNOSIS — Z8701 Personal history of pneumonia (recurrent): Secondary | ICD-10-CM

## 2024-04-10 DIAGNOSIS — I5032 Chronic diastolic (congestive) heart failure: Secondary | ICD-10-CM | POA: Diagnosis present

## 2024-04-10 DIAGNOSIS — J9621 Acute and chronic respiratory failure with hypoxia: Secondary | ICD-10-CM | POA: Diagnosis present

## 2024-04-10 DIAGNOSIS — I2489 Other forms of acute ischemic heart disease: Secondary | ICD-10-CM | POA: Diagnosis present

## 2024-04-10 DIAGNOSIS — Z881 Allergy status to other antibiotic agents status: Secondary | ICD-10-CM

## 2024-04-10 DIAGNOSIS — C9200 Acute myeloblastic leukemia, not having achieved remission: Secondary | ICD-10-CM | POA: Diagnosis present

## 2024-04-10 DIAGNOSIS — Z87891 Personal history of nicotine dependence: Secondary | ICD-10-CM

## 2024-04-10 DIAGNOSIS — R0789 Other chest pain: Secondary | ICD-10-CM | POA: Diagnosis not present

## 2024-04-10 DIAGNOSIS — Z86008 Personal history of in-situ neoplasm of other site: Secondary | ICD-10-CM | POA: Diagnosis not present

## 2024-04-10 DIAGNOSIS — J9611 Chronic respiratory failure with hypoxia: Secondary | ICD-10-CM | POA: Diagnosis present

## 2024-04-10 DIAGNOSIS — Z9981 Dependence on supplemental oxygen: Secondary | ICD-10-CM

## 2024-04-10 DIAGNOSIS — R54 Age-related physical debility: Secondary | ICD-10-CM | POA: Diagnosis present

## 2024-04-10 DIAGNOSIS — Z79899 Other long term (current) drug therapy: Secondary | ICD-10-CM

## 2024-04-10 DIAGNOSIS — D6959 Other secondary thrombocytopenia: Secondary | ICD-10-CM | POA: Diagnosis present

## 2024-04-10 DIAGNOSIS — Z880 Allergy status to penicillin: Secondary | ICD-10-CM | POA: Diagnosis not present

## 2024-04-10 DIAGNOSIS — Z888 Allergy status to other drugs, medicaments and biological substances status: Secondary | ICD-10-CM

## 2024-04-10 DIAGNOSIS — J439 Emphysema, unspecified: Secondary | ICD-10-CM | POA: Diagnosis present

## 2024-04-10 DIAGNOSIS — Z7189 Other specified counseling: Secondary | ICD-10-CM | POA: Diagnosis not present

## 2024-04-10 DIAGNOSIS — Z789 Other specified health status: Secondary | ICD-10-CM | POA: Diagnosis not present

## 2024-04-10 DIAGNOSIS — I214 Non-ST elevation (NSTEMI) myocardial infarction: Secondary | ICD-10-CM

## 2024-04-10 DIAGNOSIS — Z808 Family history of malignant neoplasm of other organs or systems: Secondary | ICD-10-CM

## 2024-04-10 DIAGNOSIS — Z9049 Acquired absence of other specified parts of digestive tract: Secondary | ICD-10-CM | POA: Diagnosis not present

## 2024-04-10 DIAGNOSIS — Z515 Encounter for palliative care: Secondary | ICD-10-CM | POA: Diagnosis not present

## 2024-04-10 DIAGNOSIS — T451X5A Adverse effect of antineoplastic and immunosuppressive drugs, initial encounter: Secondary | ICD-10-CM | POA: Diagnosis present

## 2024-04-10 DIAGNOSIS — Z882 Allergy status to sulfonamides status: Secondary | ICD-10-CM

## 2024-04-10 LAB — RESP PANEL BY RT-PCR (RSV, FLU A&B, COVID)  RVPGX2
Influenza A by PCR: NEGATIVE
Influenza B by PCR: NEGATIVE
Resp Syncytial Virus by PCR: NEGATIVE
SARS Coronavirus 2 by RT PCR: NEGATIVE

## 2024-04-10 LAB — COMPREHENSIVE METABOLIC PANEL WITH GFR
ALT: 22 U/L (ref 0–44)
AST: 37 U/L (ref 15–41)
Albumin: 3.1 g/dL — ABNORMAL LOW (ref 3.5–5.0)
Alkaline Phosphatase: 51 U/L (ref 38–126)
Anion gap: 12 (ref 5–15)
BUN: 20 mg/dL (ref 8–23)
CO2: 24 mmol/L (ref 22–32)
Calcium: 8.8 mg/dL — ABNORMAL LOW (ref 8.9–10.3)
Chloride: 99 mmol/L (ref 98–111)
Creatinine, Ser: 1.44 mg/dL — ABNORMAL HIGH (ref 0.61–1.24)
GFR, Estimated: 47 mL/min — ABNORMAL LOW (ref >60)
Glucose, Bld: 108 mg/dL — ABNORMAL HIGH (ref 70–99)
Potassium: 3.4 mmol/L — ABNORMAL LOW (ref 3.5–5.1)
Sodium: 135 mmol/L (ref 135–145)
Total Bilirubin: 0.7 mg/dL (ref 0.0–1.2)
Total Protein: 8.1 g/dL (ref 6.5–8.1)

## 2024-04-10 LAB — BRAIN NATRIURETIC PEPTIDE: B Natriuretic Peptide: 268 pg/mL — ABNORMAL HIGH (ref 0.0–100.0)

## 2024-04-10 LAB — TSH: TSH: 1.326 u[IU]/mL (ref 0.350–4.500)

## 2024-04-10 LAB — BLOOD GAS, VENOUS

## 2024-04-10 LAB — CBC WITH DIFFERENTIAL/PLATELET
Abs Immature Granulocytes: 0 10*3/uL (ref 0.00–0.07)
Basophils Absolute: 0 10*3/uL (ref 0.0–0.1)
Basophils Relative: 0 %
Eosinophils Absolute: 0 10*3/uL (ref 0.0–0.5)
Eosinophils Relative: 0 %
HCT: 27.9 % — ABNORMAL LOW (ref 39.0–52.0)
Hemoglobin: 8.9 g/dL — ABNORMAL LOW (ref 13.0–17.0)
Lymphocytes Relative: 9 %
Lymphs Abs: 10.5 10*3/uL — ABNORMAL HIGH (ref 0.7–4.0)
MCH: 31.2 pg (ref 26.0–34.0)
MCHC: 31.9 g/dL (ref 30.0–36.0)
MCV: 97.9 fL (ref 80.0–100.0)
Monocytes Absolute: 79.4 10*3/uL — ABNORMAL HIGH (ref 0.1–1.0)
Monocytes Relative: 68 %
Neutro Abs: 11.7 10*3/uL — ABNORMAL HIGH (ref 1.7–7.7)
Neutrophils Relative %: 10 %
Other: 13 %
Platelets: 123 10*3/uL — ABNORMAL LOW (ref 150–400)
RBC: 2.85 MIL/uL — ABNORMAL LOW (ref 4.22–5.81)
RDW: 16.1 % — ABNORMAL HIGH (ref 11.5–15.5)
Smear Review: NORMAL
WBC: 116.7 10*3/uL (ref 4.0–10.5)
nRBC: 1 % — ABNORMAL HIGH (ref 0.0–0.2)
nRBC: 2 /100{WBCs} — ABNORMAL HIGH

## 2024-04-10 LAB — BLOOD GAS, ARTERIAL
Acid-base deficit: 0.6 mmol/L (ref 0.0–2.0)
Bicarbonate: 23.2 mmol/L (ref 20.0–28.0)
O2 Content: 10 L/min
O2 Saturation: 90.8 %
Patient temperature: 37
pCO2 arterial: 35 mmHg (ref 32–48)
pH, Arterial: 7.43 (ref 7.35–7.45)
pO2, Arterial: 58 mmHg — ABNORMAL LOW (ref 83–108)

## 2024-04-10 LAB — MAGNESIUM: Magnesium: 1.5 mg/dL — ABNORMAL LOW (ref 1.7–2.4)

## 2024-04-10 LAB — HEPARIN LEVEL (UNFRACTIONATED): Heparin Unfractionated: <0.1 [IU]/mL — ABNORMAL LOW (ref 0.30–0.70)

## 2024-04-10 LAB — TROPONIN I (HIGH SENSITIVITY)
Troponin I (High Sensitivity): 71 ng/L — ABNORMAL HIGH (ref <18)
Troponin I (High Sensitivity): 189 ng/L (ref <18)
Troponin I (High Sensitivity): 223 ng/L

## 2024-04-10 LAB — PROTIME-INR
INR: 1.5 — ABNORMAL HIGH (ref 0.8–1.2)
Prothrombin Time: 17.8 s — ABNORMAL HIGH (ref 11.4–15.2)

## 2024-04-10 LAB — APTT: aPTT: 45 s — ABNORMAL HIGH (ref 24–36)

## 2024-04-10 LAB — PATHOLOGIST SMEAR REVIEW

## 2024-04-10 MED ORDER — ENOXAPARIN SODIUM 40 MG/0.4ML IJ SOSY: 40.0000 mg | PREFILLED_SYRINGE | INTRAMUSCULAR | Status: DC

## 2024-04-10 MED ORDER — SODIUM CHLORIDE 0.9 % IV BOLUS
1000.0000 mL | Freq: Once | INTRAVENOUS | Status: AC
  Administered 2024-04-10: 1000 mL via INTRAVENOUS

## 2024-04-10 MED ORDER — HEPARIN BOLUS VIA INFUSION
4000.0000 [IU] | Freq: Once | INTRAVENOUS | Status: AC
  Administered 2024-04-10: 4000 [IU] via INTRAVENOUS
  Filled 2024-04-10: qty 4000

## 2024-04-10 MED ORDER — BISOPROLOL FUMARATE 5 MG PO TABS
2.5000 mg | ORAL_TABLET | Freq: Every day | ORAL | Status: DC
  Administered 2024-04-10: 2.5 mg via ORAL
  Filled 2024-04-10 (×2): qty 0.5

## 2024-04-10 MED ORDER — HYDROXYUREA 500 MG PO CAPS
500.0000 mg | ORAL_CAPSULE | Freq: Every day | ORAL | Status: DC
  Administered 2024-04-10 – 2024-04-11 (×2): 500 mg via ORAL
  Filled 2024-04-10 (×5): qty 1

## 2024-04-10 MED ORDER — MORPHINE SULFATE (PF) 2 MG/ML IV SOLN: 2.0000 mg | INTRAVENOUS | Status: AC | PRN

## 2024-04-10 MED ORDER — MAGNESIUM SULFATE 2 GM/50ML IV SOLN
2.0000 g | Freq: Once | INTRAVENOUS | Status: AC
  Administered 2024-04-10: 2 g via INTRAVENOUS
  Filled 2024-04-10: qty 50

## 2024-04-10 MED ORDER — MIRTAZAPINE 15 MG PO TABS
15.0000 mg | ORAL_TABLET | Freq: Every day | ORAL | Status: DC
  Administered 2024-04-10 – 2024-04-11 (×2): 15 mg via ORAL
  Filled 2024-04-10 (×2): qty 1

## 2024-04-10 MED ORDER — ALLOPURINOL 100 MG PO TABS
300.0000 mg | ORAL_TABLET | Freq: Every day | ORAL | Status: DC
  Administered 2024-04-10 – 2024-04-11 (×2): 300 mg via ORAL
  Filled 2024-04-10: qty 3
  Filled 2024-04-10: qty 1
  Filled 2024-04-10: qty 3

## 2024-04-10 MED ORDER — COLCHICINE 0.6 MG PO TABS
0.6000 mg | ORAL_TABLET | Freq: Two times a day (BID) | ORAL | Status: DC
  Administered 2024-04-10 – 2024-04-11 (×3): 0.6 mg via ORAL
  Filled 2024-04-10 (×3): qty 1

## 2024-04-10 MED ORDER — HEPARIN BOLUS VIA INFUSION
2000.0000 [IU] | Freq: Once | INTRAVENOUS | Status: AC
  Administered 2024-04-11: 2000 [IU] via INTRAVENOUS
  Filled 2024-04-10: qty 2000

## 2024-04-10 MED ORDER — NITROGLYCERIN 0.4 MG SL SUBL
0.4000 mg | SUBLINGUAL_TABLET | SUBLINGUAL | Status: DC | PRN
  Administered 2024-04-10: 0.4 mg via SUBLINGUAL
  Filled 2024-04-10: qty 1

## 2024-04-10 MED ORDER — ACETAMINOPHEN 325 MG PO TABS
650.0000 mg | ORAL_TABLET | Freq: Four times a day (QID) | ORAL | Status: DC | PRN
Start: 2024-04-10 — End: 2024-04-12
  Administered 2024-04-11: 650 mg via ORAL
  Filled 2024-04-10: qty 2

## 2024-04-10 MED ORDER — PANTOPRAZOLE SODIUM 20 MG PO TBEC
20.0000 mg | DELAYED_RELEASE_TABLET | Freq: Every day | ORAL | Status: DC
  Administered 2024-04-10 – 2024-04-11 (×2): 20 mg via ORAL
  Filled 2024-04-10 (×4): qty 1

## 2024-04-10 MED ORDER — LORAZEPAM 2 MG/ML IJ SOLN: 0.5000 mg | INTRAMUSCULAR | Status: DC | PRN

## 2024-04-10 MED ORDER — ADULT MULTIVITAMIN W/MINERALS CH
1.0000 | ORAL_TABLET | Freq: Every day | ORAL | Status: DC
  Administered 2024-04-11: 1 via ORAL
  Filled 2024-04-10: qty 1

## 2024-04-10 MED ORDER — ONDANSETRON HCL 4 MG/2ML IJ SOLN
4.0000 mg | Freq: Four times a day (QID) | INTRAMUSCULAR | Status: DC | PRN
  Administered 2024-04-11: 4 mg via INTRAVENOUS
  Filled 2024-04-10: qty 2

## 2024-04-10 MED ORDER — KETOROLAC TROMETHAMINE 15 MG/ML IJ SOLN
15.0000 mg | Freq: Four times a day (QID) | INTRAMUSCULAR | Status: AC | PRN
  Administered 2024-04-10: 15 mg via INTRAVENOUS
  Filled 2024-04-10: qty 1

## 2024-04-10 MED ORDER — ALBUTEROL SULFATE (2.5 MG/3ML) 0.083% IN NEBU: 2.5000 mg | INHALATION_SOLUTION | Freq: Four times a day (QID) | RESPIRATORY_TRACT | Status: DC | PRN

## 2024-04-10 MED ORDER — ACETAMINOPHEN 650 MG RE SUPP: 650.0000 mg | Freq: Four times a day (QID) | RECTAL | Status: DC | PRN

## 2024-04-10 MED ORDER — SODIUM CHLORIDE 0.9 % IV SOLN: INTRAVENOUS | Status: AC

## 2024-04-10 MED ORDER — MORPHINE SULFATE (PF) 4 MG/ML IV SOLN
4.0000 mg | INTRAVENOUS | Status: AC | PRN
  Administered 2024-04-10: 4 mg via INTRAVENOUS
  Filled 2024-04-10: qty 1

## 2024-04-10 MED ORDER — IOHEXOL 350 MG/ML SOLN
75.0000 mL | Freq: Once | INTRAVENOUS | Status: AC | PRN
  Administered 2024-04-10: 75 mL via INTRAVENOUS

## 2024-04-10 MED ORDER — HEPARIN (PORCINE) 25000 UT/250ML-% IV SOLN
1500.0000 [IU]/h | INTRAVENOUS | Status: DC
  Administered 2024-04-10: 800 [IU]/h via INTRAVENOUS
  Administered 2024-04-11: 1200 [IU]/h via INTRAVENOUS
  Filled 2024-04-10 (×2): qty 250

## 2024-04-10 MED ORDER — ONDANSETRON HCL 4 MG PO TABS: 4.0000 mg | ORAL_TABLET | Freq: Four times a day (QID) | ORAL | Status: DC | PRN

## 2024-04-10 MED ORDER — IBUPROFEN 400 MG PO TABS
600.0000 mg | ORAL_TABLET | Freq: Four times a day (QID) | ORAL | Status: DC
  Administered 2024-04-10 – 2024-04-11 (×5): 600 mg via ORAL
  Filled 2024-04-10 (×5): qty 2

## 2024-04-10 MED ORDER — ENSURE ENLIVE PO LIQD
237.0000 mL | Freq: Two times a day (BID) | ORAL | Status: DC
  Administered 2024-04-11: 237 mL via ORAL
  Filled 2024-04-10: qty 237

## 2024-04-10 NOTE — Hospital Course (Addendum)
 Mr. Dylan Weber is an 89 year old male with history of COPD, history of PE, AML, who presents emergency department for chief concerns of right-sided chest pain.  Patient was noted to have desaturation in the high 80s on baseline 2 L nasal cannula.  Vitals in the ED showed T 98.9, rr 20, heart rate 99, blood pressure 123/66, SpO2 90% on 2 L nasal cannula and desatted to 88% on 2 L nasal cannula.  Serum sodium showed 135, potassium 3.4, chloride 99, bicarb 24, BUN of 20, serum creatinine 1.44, EGFR 47, nonfasting blood glucose 108, WBC elevated at 116.7, hemoglobin 8.9, platelets of 123.  COVID/influenza A/influenza B/RSV PCR were negative.  BNP was elevated to 68.  High sensitive troponin 71>> 189.  Patient with recent diagnosis of AML, currently on hydroxyurea  and the plan to start chemo on Monday, 12/15/2023.  Cardiology and oncology both are consulted and patient was placed on heparin  infusion.  Also started on colchicine  for concern of pericarditis.  5/31: Vital stable on 12 L of oxygen, slowly increasing troponin currently at 236,WBC at 105, hemoglobin decreased to 8.1 and worsening of thrombocytopenia with platelet at 97, potassium at 2.9 with magnesium  of 2.2, replacing potassium.  Mild metabolic acidosis with bicarb of 21, some improvement in creatinine to 1.36.  Blood smear with 55% blasts.  Patient was seen at cancer center on Wednesday 5/28 with rising WBC at 66 and chest pain and was given IV fluid, 13% blasts were noted during that visit and plan was to start chemo on Monday.  Now presented with worsening leukocytosis above 100,000 and 55% of blasts.  Patient likely having acute chest with blast crisis.  Very high mortality especially in his age group.  Palliative care was also consulted.  Patient need to be at a tertiary care center as we will not be able to provide the necessary care at current facility.  Luckily he got accepted at [facility] by on-call oncology for further management  of his blast crisis.  Patient is being transferred to [facility] for further management.

## 2024-04-10 NOTE — Assessment & Plan Note (Signed)
 Suspect secondary to demand ischemia, however, I will check TSH on admission

## 2024-04-10 NOTE — Telephone Encounter (Signed)
 Wife called to let you know that the patient is in the hospital and he is in pain having shortness of breath and very weak.  Supposed to start on the treatment plan on June 2.  She states she wants to see what is going on with leukemia what can me expect with all this things going on and will he be able to get treatment on 6 /2

## 2024-04-10 NOTE — Assessment & Plan Note (Addendum)
 query secondary to AML flare vs NSTEMI Continue oxygen supplementation to maintain SpO2 greater than 92% Continuous pulse oximetry

## 2024-04-10 NOTE — Consult Note (Signed)
 PHARMACY - ANTICOAGULATION CONSULT NOTE  Pharmacy Consult for heparin  infusion Indication: chest pain/ACS  Allergies  Allergen Reactions   Pregabalin Anaphylaxis   Diltiazem  Other (See Comments)    Flushing/redness to body   Prednisone  Other (See Comments)    jittery   Meloxicam Nausea Only   Amoxicillin Rash   Sulfamethoxazole-Trimethoprim Rash    Patient Measurements: Height: 5\' 9"  (175.3 cm) Weight: 68 kg (150 lb) IBW/kg (Calculated) : 70.7 HEPARIN  DW (KG): 68  Vital Signs: Temp: 98.5 F (36.9 C) (05/30 2053) Temp Source: Oral (05/30 1231) BP: 102/52 (05/30 2053) Pulse Rate: 94 (05/30 2053)  Labs: Recent Labs    04/08/24 0853 04/10/24 0823 04/10/24 1232 04/10/24 1753 04/10/24 2143  HGB 9.2* 8.9*  --   --   --   HCT 30.2* 27.9*  --   --   --   PLT 136* 123*  --   --   --   APTT  --  45*  --   --   --   LABPROT  --  17.8*  --   --   --   INR  --  1.5*  --   --   --   HEPARINUNFRC  --   --   --   --  <0.10*  CREATININE 1.50* 1.44*  --   --   --   TROPONINIHS  --  71* 189* 223*  --     Estimated Creatinine Clearance: 34.1 mL/min (A) (by C-G formula based on SCr of 1.44 mg/dL (H)).   Medical History: Past Medical History:  Diagnosis Date   Basal cell carcinoma 06/19/2022   Dorsum nose. Nodular. EDC 07/17/2022   BCC (basal cell carcinoma) 10/11/2022   right nasal supratip, superficial and nodular, EDC 12/19/22   COPD (chronic obstructive pulmonary disease) (HCC)    Coronary artery calcification seen on CT scan 11/17/2021   COVID-19 10/2021   Diastolic dysfunction    a. 11/2021 Echo: EF 55-60%, no rwma, GrI DD, nl RV fxn. Mild AI.   GERD (gastroesophageal reflux disease)    Pulmonary embolism (HCC)    a. 11/2021 CTA Chest: small peripheral PE in RLL branch of PA-->Eliquis .   Right leg DVT (HCC)    a. 11/2021 LE U/S: occlusive R calf vein DVT in R peroneal vein-->eliquis .   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 [facility] 07/17/2022   SVT (supraventricular tachycardia) (HCC)    a. Dx 10/2021 in setting of COVID infxn.    Medications:  No home anticoagulants per pharmacist review.  Assessment: 89 yo male presented to the ED with chest pain.  PMH includes COPD, Hx of PE, AML, and CAD.  Patient found to have elevated troponin.  Pharmacy consulted to initiate heparin  infusion.  Baseline aPTT and PT/INR ordered  Goal of Therapy:  Heparin  level 0.3-0.7 units/ml Monitor platelets by anticoagulation protocol: Yes  05/30 2143 HL < 0.1, subtherapeutic   Plan:  Bolus 2000 units x 1 Increase heparin  infusion rate to 1000 units/hr Recheck HL in 8 hrs after rate change CBC daily while on heparin   Coretta Dexter, PharmD, [facility] 04/10/2024 11:34 PM

## 2024-04-10 NOTE — Assessment & Plan Note (Signed)
 As needed nitroglycerin for chest pain ordered

## 2024-04-10 NOTE — Assessment & Plan Note (Signed)
 Home hydroxyurea  dosing resumed on admission

## 2024-04-10 NOTE — ED Notes (Signed)
Informed RN bed assigned 

## 2024-04-10 NOTE — Consult Note (Signed)
 PHARMACY - ANTICOAGULATION CONSULT NOTE  Pharmacy Consult for heparin  infusion Indication: chest pain/ACS  Allergies  Allergen Reactions   Pregabalin Anaphylaxis   Diltiazem  Other (See Comments)    Flushing/redness to body   Prednisone  Other (See Comments)    jittery   Meloxicam Nausea Only   Amoxicillin Rash   Sulfamethoxazole-Trimethoprim Rash    Patient Measurements: Height: 5\' 9"  (175.3 cm) Weight: 68 kg (150 lb) IBW/kg (Calculated) : 70.7 HEPARIN  DW (KG): 68  Vital Signs: Temp: 98.9 F (37.2 C) (05/30 1231) Temp Source: Oral (05/30 1231) BP: 123/66 (05/30 0823) Pulse Rate: 98 (05/30 0820)  Labs: Recent Labs    04/08/24 0853 04/10/24 0823 04/10/24 1232  HGB 9.2* 8.9*  --   HCT 30.2* 27.9*  --   PLT 136* 123*  --   CREATININE 1.50* 1.44*  --   TROPONINIHS  --  71* 189*    Estimated Creatinine Clearance: 34.1 mL/min (A) (by C-G formula based on SCr of 1.44 mg/dL (H)).   Medical History: Past Medical History:  Diagnosis Date   Basal cell carcinoma 06/19/2022   Dorsum nose. Nodular. EDC 07/17/2022   BCC (basal cell carcinoma) 10/11/2022   right nasal supratip, superficial and nodular, EDC 12/19/22   COPD (chronic obstructive pulmonary disease) (HCC)    Coronary artery calcification seen on CT scan 11/17/2021   COVID-19 10/2021   Diastolic dysfunction    a. 11/2021 Echo: EF 55-60%, no rwma, GrI DD, nl RV fxn. Mild AI.   GERD (gastroesophageal reflux disease)    Pulmonary embolism (HCC)    a. 11/2021 CTA Chest: small peripheral PE in RLL branch of PA-->Eliquis .   Right leg DVT (HCC)    a. 11/2021 LE U/S: occlusive R calf vein DVT in R peroneal vein-->eliquis .   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 [facility] 07/17/2022   SVT (supraventricular tachycardia) (HCC)    a. Dx 10/2021 in setting of COVID infxn.    Medications:  No home anticoagulants  per pharmacist review.  Assessment: 89 yo male presented to the ED with chest pain.  PMH includes COPD, Hx of PE, AML, and CAD.  Patient found to have elevated troponin.  Pharmacy consulted to initiate heparin  infusion.  Baseline aPTT and PT/INR ordered  Goal of Therapy:  Heparin  level 0.3-0.7 units/ml Monitor platelets by anticoagulation protocol: Yes   Plan:  Give 4000 units bolus x 1 Start heparin  infusion at 800 units/hr Check anti-Xa level in 8 hours and daily while on heparin  Continue to monitor H&H and platelets  Ramonita Burow, PharmD, BCPS 04/10/2024,2:58 PM

## 2024-04-10 NOTE — Significant Event (Addendum)
 Received message from nursing that patient is having chest pain with hypoxia, oxygen desatted to 84% on 5 L nasal cannula.  Patient also noting chest pain not relieved with IV morphine   Evaluated patient at bedside.  He was able to tell me his first and last name, age, location.  The year required a little bit more prompting than earlier during the day.  He reports the chest pain is improved after he was given the second IV medication.  # Acute hypoxic respiratory failure # Chest pain not relieved with morphine  # Query pericarditis - Complete echo ordered - Repeat EKG, VBG, has has a troponin - Toradol 15 mg IV every 6 hours as needed for mild pain ordered - Ibuprofen 600 mg p.o. every 6 hours initiated on admission - Colchicine initiated by cardiology - Repeat chest x-ray, stat - Telemetry cardiac change to PCU - Repeat ABG  Dr. Reinhold Carbine

## 2024-04-10 NOTE — Assessment & Plan Note (Addendum)
 Suspect demand ischemia in setting of AML flare However in setting of chest pain endorsement and elevated BNP, I will order a complete echo on admission  Addendum: Repeat high sensitive troponin had positive delta, from 71-189.  Complete echo ordered.  Heparin  bolus and gtt. initiated on admission.  Cardiology consulted

## 2024-04-10 NOTE — Plan of Care (Signed)

## 2024-04-10 NOTE — ED Notes (Signed)
 RT at bedside to place pt on highflow due to sats in 80's

## 2024-04-10 NOTE — ED Triage Notes (Signed)
 Pt BIB [facility] from home c/o  R sided chest pain and back pain that started this AM. Pt reports back pain is triggered by movement.  Pt was dx with pneumonia x2 weeks ago and wears 2 L of O2. EMS gave 325mg  aspirin .

## 2024-04-10 NOTE — Progress Notes (Signed)
 Echocardiogram 2D Echocardiogram has been performed.  Dylan Weber 04/10/2024, 5:44 PM

## 2024-04-10 NOTE — Consult Note (Signed)
 [facility]  [phone] [phone]  ID: Dylan Weber OB: Jul 19, 1933  MR#: [id number]  WNU#:[id number]  Patient Care Team: Will Hare, NP as PCP - General (Family Medicine) Devorah Fonder, MD as PCP - Cardiology (Cardiology) Shellie Dials, MD as Consulting Physician (Oncology)  CHIEF COMPLAINT: AML now with worsening shortness of breath and chest/back pain.  INTERVAL HISTORY: Patient is an 89 year old male recently diagnosed with AML with plans to initiate chemotherapy on Monday, April 13, 2024.  He recently presented to the emergency department with back and right sided chest pain he also would have oxygen desaturations into the 80s despite oxygen supplementation.  He continues to be short of breath and have pain, but otherwise feels well.  He has increased weakness and fatigue.  He has no neurologic complaints.  He can has any fevers.  He has a fair appetite, but denies weight loss.  He has no cough or hemoptysis.  He denies any nausea, vomiting, constipation, or diarrhea.  He has no urinary complaints.  Patient offers no further specific complaints today.  REVIEW OF SYSTEMS:   Review of Systems  Constitutional:  Positive for malaise/fatigue. Negative for fever and weight loss.  Respiratory:  Positive for shortness of breath. Negative for cough and hemoptysis.   Cardiovascular:  Positive for chest pain. Negative for leg swelling.  Gastrointestinal:  Negative for abdominal pain.  Genitourinary: Negative.  Negative for dysuria.  Musculoskeletal:  Positive for back pain.  Skin:  Positive for rash.  Neurological:  Positive for weakness. Negative for dizziness, focal weakness and headaches.  Psychiatric/Behavioral: Negative.  The patient is not nervous/anxious.     As per HPI. Otherwise, a complete review of systems is negative.  PAST MEDICAL HISTORY: Past Medical History:  Diagnosis Date   Basal cell carcinoma 06/19/2022    Dorsum nose. Nodular. EDC 07/17/2022   BCC (basal cell carcinoma) 10/11/2022   right nasal supratip, superficial and nodular, EDC 12/19/22   COPD (chronic obstructive pulmonary disease) (HCC)    Coronary artery calcification seen on CT scan 11/17/2021   COVID-19 10/2021   Diastolic dysfunction    a. 11/2021 Echo: EF 55-60%, no rwma, GrI DD, nl RV fxn. Mild AI.   GERD (gastroesophageal reflux disease)    Pulmonary embolism (HCC)    a. 11/2021 CTA Chest: small peripheral PE in RLL branch of PA-->Eliquis .   Right leg DVT (HCC)    a. 11/2021 LE U/S: occlusive R calf vein DVT in R peroneal vein-->eliquis .   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 [facility] 07/17/2022   SVT (supraventricular tachycardia) (HCC)    a. Dx 10/2021 in setting of COVID infxn.    PAST SURGICAL HISTORY: Past Surgical History:  Procedure Laterality Date   APPENDECTOMY     CHOLECYSTECTOMY     EYE SURGERY      FAMILY HISTORY: Family History  Problem Relation Age of Onset   Uterine cancer Mother    Brain cancer Sister     ADVANCED DIRECTIVES (Y/N):  @ADVDIR @  HEALTH MAINTENANCE: Social History   Tobacco Use   Smoking status: Former    Types: Cigarettes   Smokeless tobacco: Former  Building services engineer status: Never Used  Substance Use Topics   Alcohol use: Yes    Alcohol/week: 7.0 standard drinks of alcohol    Types: 7 Shots of  liquor per week   Drug use: Never     Colonoscopy:  PAP:  Bone density:  Lipid panel:  Allergies  Allergen Reactions   Pregabalin Anaphylaxis   Diltiazem  Other (See Comments)    Flushing/redness to body   Prednisone  Other (See Comments)    jittery   Meloxicam Nausea Only   Amoxicillin Rash   Sulfamethoxazole-Trimethoprim Rash    Current Facility-Administered Medications  Medication Dose Route Frequency Provider Last Rate Last Admin   0.9 %  sodium chloride   infusion   Intravenous Continuous Cox, Amy N, DO       acetaminophen  (TYLENOL ) tablet 650 mg  650 mg Oral Q6H PRN Cox, Amy N, DO       Or   acetaminophen  (TYLENOL ) suppository 650 mg  650 mg Rectal Q6H PRN Cox, Amy N, DO       albuterol  (PROVENTIL ) (2.5 MG/3ML) 0.083% nebulizer solution 2.5 mg  2.5 mg Nebulization Q6H PRN Cox, Amy N, DO       allopurinol (ZYLOPRIM) tablet 300 mg  300 mg Oral Daily Cox, Amy N, DO       bisoprolol  (ZEBETA ) tablet 2.5 mg  2.5 mg Oral Daily Cox, Amy N, DO       feeding supplement (ENSURE ENLIVE / ENSURE PLUS) liquid 237 mL  237 mL Oral BID BM Cox, Amy N, DO       heparin  ADULT infusion 100 units/mL (25000 units/250mL)  800 Units/hr Intravenous Continuous Ramonita Burow, [facility]       heparin  bolus via infusion 4,000 Units  4,000 Units Intravenous Once Greenwood, Howard F, RPH       hydroxyurea  (HYDREA ) capsule 500 mg  500 mg Oral Daily Cox, Amy N, DO       mirtazapine (REMERON) tablet 15 mg  15 mg Oral QHS Cox, Amy N, DO       morphine  (PF) 2 MG/ML injection 2 mg  2 mg Intravenous Q4H PRN Cox, Amy N, DO       morphine  (PF) 4 MG/ML injection 4 mg  4 mg Intravenous Q4H PRN Cox, Amy N, DO       [START ON 04/11/2024] multivitamin with minerals tablet 1 tablet  1 tablet Oral Daily Cox, Amy N, DO       nitroGLYCERIN (NITROSTAT) SL tablet 0.4 mg  0.4 mg Sublingual Q5 min PRN Cox, Amy N, DO       ondansetron  (ZOFRAN ) tablet 4 mg  4 mg Oral Q6H PRN Cox, Amy N, DO       Or   ondansetron  (ZOFRAN ) injection 4 mg  4 mg Intravenous Q6H PRN Cox, Amy N, DO       pantoprazole  (PROTONIX ) EC tablet 20 mg  20 mg Oral Daily Cox, Amy N, DO       Current Outpatient Medications  Medication Sig Dispense Refill   acetaminophen  (TYLENOL ) 650 MG CR tablet Take 650 mg by mouth as needed for pain.     albuterol  (VENTOLIN  HFA) 108 (90 Base) MCG/ACT inhaler Inhale 2 puffs into the lungs every 6 (six) hours as needed for wheezing or shortness of breath. 8 g 0   allopurinol (ZYLOPRIM) 300 MG  tablet Take 1 tablet (300 mg total) by mouth daily. Start on 04/09/24. 30 tablet 0   bisoprolol  (ZEBETA ) 5 MG tablet TAKE 1 TABLET (5 MG TOTAL) BY MOUTH IN THE MORNING AND AT BEDTIME (Patient taking differently: Take 2.5 mg by mouth daily. TAKE 1 TABLET (5 MG TOTAL) BY  MOUTH IN THE MORNING AND AT BEDTIME) 180 tablet 3   ciclopirox  (LOPROX ) 0.77 % SUSP Apply to toenails QD-BID. 30 mL 11   COMBIVENT  RESPIMAT 20-100 MCG/ACT AERS respimat Inhale 1 puff into the lungs every 6 (six) hours as needed.     HYDROcodone-acetaminophen  (NORCO/VICODIN) 5-325 MG tablet Take 1 tablet by mouth every 8 (eight) hours as needed for moderate pain (pain score 4-6). 30 tablet 0   hydroxyurea  (HYDREA ) 500 MG capsule Take 1 capsule (500 mg total) by mouth daily. May take with food to minimize GI side effects. 30 capsule 0   mirtazapine (REMERON) 15 MG tablet Take 1 tablet (15 mg total) by mouth at bedtime. 30 tablet 2   Multiple Vitamin (MULTIVITAMIN) capsule Take 1 capsule by mouth daily.     mupirocin  ointment (BACTROBAN ) 2 % Apply 1 Application topically daily. 22 g 0   ondansetron  (ZOFRAN ) 8 MG tablet Take 1 tablet (8 mg total) by mouth every 8 (eight) hours as needed for nausea or vomiting. 60 tablet 2   pantoprazole  (PROTONIX ) 20 MG tablet Take 20 mg by mouth daily.     Probiotic Product (PROBIOTIC ADVANCED PO) Take by mouth daily.     feeding supplement (ENSURE ENLIVE / ENSURE PLUS) LIQD Take 237 mLs by mouth 2 (two) times daily between meals. 237 mL 12   ondansetron  (ZOFRAN ) 8 MG tablet One pill every 8 hours as needed for nausea/vomitting. (Patient not taking: Reported on 04/08/2024) 40 tablet 1   prochlorperazine (COMPAZINE) 10 MG tablet Take 1 tablet (10 mg total) by mouth every 6 (six) hours as needed for nausea or vomiting. (Patient not taking: Reported on 04/08/2024) 60 tablet 2   venetoclax (VENCLEXTA) 100 MG tablet Take 4 tablets (400 mg total) by mouth daily. Tablets should be swallowed whole with a meal and a  full glass of water. 120 tablet 0    OBJECTIVE: Vitals:   04/10/24 0901 04/10/24 1231  BP:    Pulse:    Resp:    Temp:  98.9 F (37.2 C)  SpO2: 92%      Body mass index is 22.15 kg/m.    ECOG FS:3 - Symptomatic, >50% confined to bed  General: Ill-appearing, mild respiratory distress. Eyes: Pink conjunctiva, anicteric sclera. HEENT: Normocephalic, moist mucous membranes. Lungs: No audible wheezing or coughing. Heart: Regular rate and rhythm. Abdomen: Soft, nontender, no obvious distention. Musculoskeletal: No edema, cyanosis, or clubbing. Neuro: Alert, answering all questions appropriately. Cranial nerves grossly intact. Skin: No rashes or petechiae noted. Psych: Normal affect.  LAB RESULTS:  Lab Results  Component Value Date   NA 135 04/10/2024   K 3.4 (L) 04/10/2024   CL 99 04/10/2024   CO2 24 04/10/2024   GLUCOSE 108 (H) 04/10/2024   BUN 20 04/10/2024   CREATININE 1.44 (H) 04/10/2024   CALCIUM 8.8 (L) 04/10/2024   PROT 8.1 04/10/2024   ALBUMIN 3.1 (L) 04/10/2024   AST 37 04/10/2024   ALT 22 04/10/2024   ALKPHOS 51 04/10/2024   BILITOT 0.7 04/10/2024   GFRNONAA 47 (L) 04/10/2024    Lab Results  Component Value Date   WBC 116.7 (HH) 04/10/2024   NEUTROABS 11.7 (H) 04/10/2024   HGB 8.9 (L) 04/10/2024   HCT 27.9 (L) 04/10/2024   MCV 97.9 04/10/2024   PLT 123 (L) 04/10/2024     STUDIES: CT Angio Chest PE W and/or Wo Contrast Result Date: 04/10/2024 CLINICAL DATA:  Pulmonary embolism EXAM: CT ANGIOGRAPHY CHEST WITH CONTRAST TECHNIQUE: Multidetector  CT imaging of the chest was performed using the standard protocol during bolus administration of intravenous contrast. Multiplanar CT image reconstructions and MIPs were obtained to evaluate the vascular anatomy. Multiplanar image (3D post-processing) reconstructions and MIPs were obtained to evaluate the vascular anatomy. RADIATION DOSE REDUCTION: This exam was performed according to the departmental  dose-optimization program which includes automated exposure control, adjustment of the mA and/or kV according to patient size and/or use of iterative reconstruction technique. CONTRAST:  75mL OMNIPAQUE  IOHEXOL  350 MG/ML SOLN COMPARISON:  CT angiogram of the chest performed Mar 21, 2024 FINDINGS: Cardiovascular: Heart size is unchanged. No pericardial effusion. Atherosclerotic changes in the left and right coronary territories. There is no evidence of pulmonary embolism to the segmental level. Thoracic aorta is unchanged estimated at 4 cm. Main pulmonary artery: Within normal limits for diameter. Mediastinum/Nodes: The previously observed right hilar node is not significantly changed. Thyroid  is unchanged. Mildly prominent lymph nodes are present in the left axilla which may be reactive. Lungs/Pleura: Emphysema. No pleural effusion or pneumothorax. The previously observed patchy area of density in the right upper lobe is improved. There is residual density, however, this is small when compared to the previous exam. Image 65 of series 4. unchanged 5 mm noncalcified nodule in the right upper lobe on image 47 of series 4. Small nodule in the right lower lobe previously estimated at 4 mm current Leigh measures 3 mm on image 79 of series 4. Upper Abdomen: Unchanged. Musculoskeletal: Unchanged. Review of the MIP images confirms the above findings. IMPRESSION: 1. No evidence of pulmonary embolism to the segmental level. 2. Previously observed, suspected infectious infiltrate in the right upper lobe is improved. 3. Emphysema. 4. Similar appearance of additional findings. Based on the previous CT examination, continued CT follow-up in approximately 1 year is recommended. Electronically Signed   By: Reagan Camera M.D.   On: 04/10/2024 12:29   DG Chest Port 1 View Result Date: 04/10/2024 CLINICAL DATA:  Right-sided chest pain for 1 day EXAM: PORTABLE CHEST 1 VIEW COMPARISON:  04/08/2024 FINDINGS: Cardiac shadow is stable.  Lungs are well aerated bilaterally. No focal infiltrate or effusion is seen. No bony abnormality is noted. IMPRESSION: No acute abnormality noted. Electronically Signed   By: Violeta Grey M.D.   On: 04/10/2024 10:11   DG Chest 2 View Result Date: 04/08/2024 CLINICAL DATA:  Shortness of breath EXAM: CHEST - 2 VIEW COMPARISON:  Chest x-ray performed Mar 21, 2024 FINDINGS: Previously observed airspace infiltrate in the right upper lobe is not clearly seen. Underlying changes of emphysema. Low lung volumes. IMPRESSION: 1. Resolution of previously observed right upper lung zone airspace infiltrate. 2. Underlying changes suggestive emphysema. 3. Low lung volumes. Electronically Signed   By: Reagan Camera M.D.   On: 04/08/2024 12:45   CT BONE MARROW BIOPSY & ASPIRATION Result Date: 03/26/2024 CLINICAL DATA:  Leukocytosis with mild glass on peripheral smear concerning for acute leukemia. EXAM: CT-guided bone marrow biopsy TECHNIQUE: CT pelvis CONTRAST:  None RADIOPHARMACEUTICALS:  None FLUOROSCOPY: 11.43 mGy COMPARISON:  None FINDINGS: The patient was placed in prone position on the CT gantry. Radiopaque markers were placed on the patient's skin and initial imaging of the pelvis was performed. The patient's skin was then prepped and draped in the usual sterile fashion. Moderate sedation was provided for by the nursing staff under my supervision utilizing intravenous Versed  and fentanyl . The nurse had no other duties other than monitoring the patient and providing sedation during the procedure. I was  present for the entire procedure. 1% lidocaine  was used to infiltrate the skin at the access site prior to a stab incision. Local anesthesia was then used to infiltrate the region of soft tissue from the skin to the left iliac bone. The needle was then advanced and CT imaging demonstrated the needle tip to be in the cortex of the left iliac bone. The bone was then penetrated and a sample was obtained. After the sample was  evaluated, approximately 5 mL of heparinized bone marrow sample was obtained by aspiration. A core sample was then obtained. The core sample appeared sufficient. All needles were then removed from the patient. Sterile dressing was applied. IMPRESSION: Satisfactory core needle biopsy and aspiration of the left iliac bone marrow under CT guidance. Electronically Signed   By: Susan Ensign   On: 03/26/2024 13:58   CT Angio Chest PE W/Cm &/Or Wo Cm Result Date: 03/21/2024 CLINICAL DATA:  Evaluate for acute pulmonary embolism. EXAM: CT ANGIOGRAPHY CHEST WITH CONTRAST TECHNIQUE: Multidetector CT imaging of the chest was performed using the standard protocol during bolus administration of intravenous contrast. Multiplanar CT image reconstructions and MIPs were obtained to evaluate the vascular anatomy. RADIATION DOSE REDUCTION: This exam was performed according to the departmental dose-optimization program which includes automated exposure control, adjustment of the mA and/or kV according to patient size and/or use of iterative reconstruction technique. CONTRAST:  75mL OMNIPAQUE  IOHEXOL  350 MG/ML SOLN COMPARISON:  06/11/2023 FINDINGS: Cardiovascular: Satisfactory opacification of the pulmonary arteries to the segmental level. No evidence of pulmonary embolism. Normal heart size. No pericardial effusion. Aortic atherosclerosis. The ascending thoracic aorta measures 4 cm, image 43/7. Coronary artery calcifications. Mediastinum/Nodes: Thyroid  gland, trachea, and esophagus appear normal. No mediastinal adenopathy. Right hilar node measures 1.5 cm, image 61/4. Lungs/Pleura: Emphysema with diffuse bronchial wall thickening. Within the anterior right upper lobe there is a patchy area of ground-glass and airspace density, image 54/5. Right upper lobe lung nodule measures 5 mm, image 38/5. Unchanged from the previous exam. This is been stable since 12/18/2022 compatible with a benign nodule. Peripheral nodule within the right  upper lobe measuring 2 mm is also unchanged from 12/28/2022. 4 mm nodule in the right lower lobe appears new, image 78/5. Upper Abdomen: No acute abnormality.  Cholecystectomy. Musculoskeletal: Thoracic degenerative disc disease. No acute or suspicious osseous findings. Review of the MIP images confirms the above findings. IMPRESSION: 1. No evidence for acute pulmonary embolism. 2. Patchy area of ground-glass and airspace density within the anterior right upper lobe is favored to represent an area of pneumonia. Recommend follow-up imaging to ensure resolution and to rule out underlying malignancy. 3. Right hilar lymphadenopathy is favored to be reactive. 4. New 4 mm nodule in the right lower lobe. If the patient is at high risk for bronchogenic carcinoma, follow-up chest CT at 1year is recommended. If the patient is at low risk, no follow-up is needed. This recommendation follows the consensus statement: Guidelines for Management of Small Pulmonary Nodules Detected on CT Scans: A Statement from the Fleischner Society as published in Radiology 2005; 237:395-400. 5. Coronary artery calcifications. 6. 4 cm ascending thoracic aorta. Recommend annual imaging followup by CTA or MRA. This recommendation follows 2010 ACCF/AHA/AATS/ACR/ASA/SCA/SCAI/SIR/STS/SVM Guidelines for the Diagnosis and Management of Patients with Thoracic Aortic Disease. Circulation. 2010; 121: Z610-R604. Aortic aneurysm NOS (ICD10-I71.9) 7. Aortic Atherosclerosis (ICD10-I70.0) and Emphysema (ICD10-J43.9). Electronically Signed   By: Kimberley Penman M.D.   On: 03/21/2024 06:44   DG Chest Portable 1 View Result  Date: 03/21/2024 CLINICAL DATA:  Shortness of breath and hypoxia.  Chest pain. EXAM: PORTABLE CHEST 1 VIEW COMPARISON:  06/11/2023. FINDINGS: The heart size and mediastinal contours are within normal limits. Faint right upper lobe opacity is noted concerning for early pneumonia. Left lung appears clear. The visualized osseous structures are  intact. The visualized skeletal structures are unremarkable. IMPRESSION: Faint right upper lobe opacity concerning for early pneumonia. Electronically Signed   By: Kimberley Penman M.D.   On: 03/21/2024 06:34    ASSESSMENT: AML now with worsening shortness of breath and chest/back pain.  PLAN:    AML: Bone marrow biopsy pathology reviewed independently confirming diagnosis. Patient was initiated on Hydrea  for cell count control.  Despite his advanced age, patient is highly interested in pursuing treatment with Vidaza and venetoclax.  He is currently scheduled to initiate treatment on Monday, April 13, 2024. Leukocytosis: Total white blood cell count significantly increased to 116.7.  Secondary to AML as well as reactive.  Continue Hydrea  as above. Anemia: Hemoglobin is trended down slightly to 8.9, monitor.   Thrombocytopenia: Possibly related to Hydrea .  Current platelet count is 123.  Patient's platelet count was previously within normal limits.  Monitor. Shortness of breath: Unclear etiology.  CT scan from earlier today did not reveal any evidence of pulmonary embolism.  Previous pneumonia is improved.  Patient's troponin levels are elevated so possibility this may be cardiac in nature, although that is more likely secondary to demand ischemia.  Continue oxygen as needed to maintain saturations greater than 90%. Pain: No obvious anatomic etiology.  Continue narcotics as prescribed.  Appreciate consult, will follow.  Shellie Dials, MD   04/10/2024 3:28 PM

## 2024-04-10 NOTE — Consult Note (Signed)
 Cardiology Consultation   Patient ID: Dylan Weber MRN: [id number]; DOB: 12-22-1932  Admit date: 04/10/2024 Date of Consult: 04/10/2024  PCP:  Will Hare, NP   [facility] Providers Cardiologist:  Belva Boyden, MD   {  Patient Profile: Dylan Weber is a 89 y.o. male with a hx of pSVT, coronary artery calcification, HFpEF, remote tobacco, COPD/emphysema, chronic respiratory failure on home O2, AML, history of PE/right lower extremity DVT who is being seen 04/10/2024 for the evaluation of chest pain and new onset atrial fibrillation at the request of Dr. Reinhold Carbine.  History of Present Illness: Dylan Weber was admitted in December 2022 with respiratory failure and COVID-19 infection.  During admission he was noted to have PSVT which was managed with oral diltiazem  and beta-blocker.  He was discharged home on oxygen.  December 29 he was being seen in outpatient pulmonology clinic reporting ongoing dyspnea.  In the setting of elevated D-dimer during COVID admission, CT of the chest was scheduled which showed small peripheral PE in the right lower lobe of the pulmonary artery.  He was given Eliquis , but unfortunately did not take it.  Patient had presyncope January 13 and went to [facility].  He was noted to have runs of SVT and metoprolol  was added.  Lower extremity ultrasound showed right lower extremity DVT.  Eliquis  was started and he was admitted.  Echo showed EF of 55 to 60% without wall motion abnormalities, grade 1 diastolic dysfunction.  Patient had 2 subsequent ER visits for dyspnea with chest CTA was negative for PE, did show coronary artery calcification.    Patient was last seen in 2024 by Dr. Gollan.  He was taking bisoprolol  2.5 mg daily with good symptom control.  The patient presented to [facility] ED 04/10/2024 with chest pain that started this morning. Says it is a sharp pain in the center of the chest that can radiate to the back. It lasts a few seconds before  subsiding. IT is intermittent and brought on by movement. He has associated SOB. Has not been ambulating, mostly sedentary. Also with generalized fatigue and weakness. No N.V, LLE. Patient reports he was treated with pneumonia 2 weeks ago.  He wears 2 L O2 at all times.   EMS gave him 325 mg ASA.  In the ER blood pressure 123/66, 90% O2 on 2 L, heart rate 99, respiratory rate 20, afebrile.  Labs showed potassium 3.4, sodium 135, chloride 99, bicarb 24, serum creatinine 1.44, EGFR 47, nonfasting blood glucose 108, WBC 116.7, hemoglobin 8.9, platelets 123.  Respiratory panel negative.  BNP 68.  High-sensitivity troponin 71>189. Mag 1.5. CXR non acute. Chest CT showed no PE, PNA improving. showed EKG showed possible Afib 101bpm and patient was started on IV heparin .   Past Medical History:  Diagnosis Date   Basal cell carcinoma 06/19/2022   Dorsum nose. Nodular. EDC 07/17/2022   BCC (basal cell carcinoma) 10/11/2022   right nasal supratip, superficial and nodular, EDC 12/19/22   COPD (chronic obstructive pulmonary disease) (HCC)    Coronary artery calcification seen on CT scan 11/17/2021   COVID-19 10/2021   Diastolic dysfunction    a. 11/2021 Echo: EF 55-60%, no rwma, GrI DD, nl RV fxn. Mild AI.   GERD (gastroesophageal reflux disease)    Pulmonary embolism (HCC)    a. 11/2021 CTA Chest: small peripheral PE in RLL branch of PA-->Eliquis .   Right leg DVT (HCC)    a. 11/2021 LE U/S: occlusive R calf vein  DVT in R peroneal vein-->eliquis .   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 [facility] 07/17/2022   SVT (supraventricular tachycardia) (HCC)    a. Dx 10/2021 in setting of COVID infxn.    Past Surgical History:  Procedure Laterality Date   APPENDECTOMY     CHOLECYSTECTOMY     EYE SURGERY       Home Medications:  Prior to Admission medications   Medication Sig Start Date End Date  Taking? Authorizing Provider  acetaminophen  (TYLENOL ) 650 MG CR tablet Take 650 mg by mouth as needed for pain.   Yes [provider]  albuterol  (VENTOLIN  HFA) 108 (90 Base) MCG/ACT inhaler Inhale 2 puffs into the lungs every 6 (six) hours as needed for wheezing or shortness of breath. 11/01/21  Yes Wieting, Richard, MD  allopurinol (ZYLOPRIM) 300 MG tablet Take 1 tablet (300 mg total) by mouth daily. Start on 04/09/24. 04/02/24  Yes Leonard, Alyson N, RPH-CPP  bisoprolol  (ZEBETA ) 5 MG tablet TAKE 1 TABLET (5 MG TOTAL) BY MOUTH IN THE MORNING AND AT BEDTIME Patient taking differently: Take 2.5 mg by mouth daily. TAKE 1 TABLET (5 MG TOTAL) BY MOUTH IN THE MORNING AND AT BEDTIME 03/04/24  Yes Gollan, Timothy J, MD  ciclopirox  (LOPROX ) 0.77 % SUSP Apply to toenails QD-BID. 10/11/22  Yes Moye, Virginia , MD  COMBIVENT  RESPIMAT 20-100 MCG/ACT AERS respimat Inhale 1 puff into the lungs every 6 (six) hours as needed.   Yes [provider]  HYDROcodone-acetaminophen  (NORCO/VICODIN) 5-325 MG tablet Take 1 tablet by mouth every 8 (eight) hours as needed for moderate pain (pain score 4-6). 04/08/24  Yes Borders, Carlene Che, NP  hydroxyurea  (HYDREA ) 500 MG capsule Take 1 capsule (500 mg total) by mouth daily. May take with food to minimize GI side effects. 03/26/24  Yes Brahmanday, Govinda R, MD  mirtazapine (REMERON) 15 MG tablet Take 1 tablet (15 mg total) by mouth at bedtime. 04/01/24  Yes Shellie Dials, MD  Multiple Vitamin (MULTIVITAMIN) capsule Take 1 capsule by mouth daily.   Yes [provider]  mupirocin  ointment (BACTROBAN ) 2 % Apply 1 Application topically daily. 02/20/23  Yes Moye, Virginia , MD  ondansetron  (ZOFRAN ) 8 MG tablet Take 1 tablet (8 mg total) by mouth every 8 (eight) hours as needed for nausea or vomiting. 04/01/24  Yes Shellie Dials, MD  pantoprazole  (PROTONIX ) 20 MG tablet Take 20 mg by mouth daily.   Yes [provider]  Probiotic Product (PROBIOTIC  ADVANCED PO) Take by mouth daily.   Yes [provider]  feeding supplement (ENSURE ENLIVE / ENSURE PLUS) LIQD Take 237 mLs by mouth 2 (two) times daily between meals. 03/26/24   Amin, Sumayya, MD  ondansetron  (ZOFRAN ) 8 MG tablet One pill every 8 hours as needed for nausea/vomitting. Patient not taking: Reported on 04/08/2024 03/26/24   Brahmanday, Govinda R, MD  prochlorperazine (COMPAZINE) 10 MG tablet Take 1 tablet (10 mg total) by mouth every 6 (six) hours as needed for nausea or vomiting. Patient not taking: Reported on 04/08/2024 04/01/24   Shellie Dials, MD  venetoclax (VENCLEXTA) 100 MG tablet Take 4 tablets (400 mg total) by mouth daily. Tablets should be swallowed whole with a meal and a full glass of water. 04/01/24   Shellie Dials, MD    Scheduled Meds:  allopurinol  300 mg Oral Daily   bisoprolol   2.5 mg Oral Daily   feeding supplement  237 mL Oral BID BM   heparin   4,000 Units Intravenous Once   hydroxyurea   500 mg Oral Daily   mirtazapine  15 mg Oral QHS   [START ON 04/11/2024] multivitamin with minerals  1 tablet Oral Daily   pantoprazole   20 mg Oral Daily   Continuous Infusions:  sodium chloride      heparin      PRN Meds: acetaminophen  **OR** acetaminophen , albuterol , morphine  injection, morphine  injection, nitroGLYCERIN, ondansetron  **OR** ondansetron  (ZOFRAN ) IV  Allergies:    Allergies  Allergen Reactions   Pregabalin Anaphylaxis   Diltiazem  Other (See Comments)    Flushing/redness to body   Prednisone  Other (See Comments)    jittery   Meloxicam Nausea Only   Amoxicillin Rash   Sulfamethoxazole-Trimethoprim Rash    Social History:   Social History   Socioeconomic History   Marital status: Married    Spouse name: Not on file   Number of children: Not on file   Years of education: Not on file   Highest education level: Not on file  Occupational History   Not on file  Tobacco Use   Smoking status: Former    Types: Cigarettes    Smokeless tobacco: Former  Advertising account planner   Vaping status: Never Used  Substance and Sexual Activity   Alcohol use: Yes    Alcohol/week: 7.0 standard drinks of alcohol    Types: 7 Shots of liquor per week   Drug use: Never   Sexual activity: Not Currently  Other Topics Concern   Not on file  Social History Narrative   Not on file   Social Drivers of Health   Financial Resource Strain: Low Risk  (12/02/2023)   Received from [facility]   Overall Financial Resource Strain (CARDIA)    Difficulty of Paying Living Expenses: Not hard at all  Food Insecurity: No Food Insecurity (03/21/2024)   Hunger Vital Sign    Worried About Running Out of Food in the Last Year: Never true    Ran Out of Food in the Last Year: Never true  Transportation Needs: No Transportation Needs (03/21/2024)   PRAPARE - Administrator, Civil Service (Medical): No    Lack of Transportation (Non-Medical): No  Physical Activity: Not on file  Stress: Not on file  Social Connections: Unknown (03/21/2024)   Social Connection and Isolation Panel [NHANES]    Frequency of Communication with Friends and Family: Once a week    Frequency of Social Gatherings with Friends and Family: Once a week    Attends Religious Services: Not on Marketing executive or Organizations: No    Attends Banker Meetings: Not on file    Marital Status: Married  Intimate Partner Violence: Not At Risk (03/21/2024)   Humiliation, Afraid, Rape, and Kick questionnaire    Fear of Current or Ex-Partner: No    Emotionally Abused: No    Physically Abused: No    Sexually Abused: No    Family History:    Family History  Problem Relation Age of Onset   Uterine cancer Mother    Brain cancer Sister      ROS:  Please see the history of present illness.   All other ROS reviewed and negative.     Physical Exam/Data: Vitals:   04/10/24 0823 04/10/24 0901 04/10/24 1231 04/10/24 1605  BP: 123/66    119/78  Pulse:  97  Resp:      Temp:   98.9 F (37.2 C) 99.6 F (37.6 C)  TempSrc:   Oral   SpO2:  92%  95%  Weight:      Height:       No intake or output data in the 24 hours ending 04/10/24 1608    04/10/2024    8:21 AM 04/08/2024    9:00 AM 04/01/2024    2:41 PM  Last 3 Weights  Weight (lbs) 150 lb 155 lb 156 lb  Weight (kg) 68.04 kg 70.308 kg 70.761 kg     Body mass index is 22.15 kg/m.  General:  Well nourished, well developed, in no acute distress HEENT: normal Neck: no JVD Vascular: No carotid bruits; Distal pulses 2+ bilaterally Cardiac:  normal S1, S2; RRR; no murmur  Lungs:  clear to auscultation bilaterally, no wheezing, rhonchi or rales  Abd: soft, nontender, no hepatomegaly  Ext: no edema Musculoskeletal:  No deformities, BUE and BLE strength normal and equal Skin: warm and dry  Neuro:  CNs 2-12 intact, no focal abnormalities noted Psych:  Normal affect   EKG:  The EKG was personally reviewed and demonstrates:  Afib/flutter, iRBBB, 101bpm Telemetry:  Telemetry was personally reviewed and demonstrates:  appears to show NSR HR 80-90s  Relevant CV Studies:  Echo 2023 . Left ventricular ejection fraction, by estimation, is 55 to 60%. The  left ventricle has normal function. The left ventricle has no regional  wall motion abnormalities. Left ventricular diastolic parameters are  consistent with Grade I diastolic  dysfunction (impaired relaxation).   2. Right ventricular systolic function is normal. The right ventricular  size is not well visualized.   3. The mitral valve is grossly normal. No evidence of mitral valve  regurgitation.   4. The aortic valve was not well visualized. Aortic valve regurgitation  is mild.   Laboratory Data: High Sensitivity Troponin:   Recent Labs  Lab 03/21/24 0350 03/21/24 0559 04/10/24 0823 04/10/24 1232  TROPONINIHS 11 11 71* 189*     Chemistry Recent Labs  Lab 04/08/24 0853 04/10/24 0823 04/10/24 1232  NA  136 135  --   K 3.7 3.4*  --   CL 100 99  --   CO2 24 24  --   GLUCOSE 139* 108*  --   BUN 24* 20  --   CREATININE 1.50* 1.44*  --   CALCIUM 8.6* 8.8*  --   MG  --   --  1.5*  GFRNONAA 45* 47*  --   ANIONGAP 12 12  --     Recent Labs  Lab 04/08/24 0853 04/10/24 0823  PROT 8.3* 8.1  ALBUMIN 3.3* 3.1*  AST 35 37  ALT 24 22  ALKPHOS 59 51  BILITOT 0.5 0.7   Lipids No results for input(s): "CHOL", "TRIG", "HDL", "LABVLDL", "LDLCALC", "CHOLHDL" in the last 168 hours.  Hematology Recent Labs  Lab 04/08/24 0853 04/10/24 0823  WBC 66.1* 116.7*  RBC 3.00* 2.85*  HGB 9.2* 8.9*  HCT 30.2* 27.9*  MCV 100.7* 97.9  MCH 30.7 31.2  MCHC 30.5 31.9  RDW 15.9* 16.1*  PLT 136* 123*   Thyroid   Recent Labs  Lab 04/10/24 1232  TSH 1.326    BNP Recent Labs  Lab 04/10/24 0823  BNP 268.0*    DDimer No results for input(s): "DDIMER" in the last 168 hours.  Radiology/Studies:  CT Angio Chest PE W and/or Wo Contrast Result Date: 04/10/2024 CLINICAL DATA:  Pulmonary embolism EXAM: CT ANGIOGRAPHY CHEST WITH CONTRAST TECHNIQUE: Multidetector CT imaging of the chest was performed using the standard protocol during bolus administration of intravenous contrast. Multiplanar CT image reconstructions and MIPs were obtained to evaluate the vascular anatomy. Multiplanar image (3D post-processing) reconstructions and MIPs were obtained to evaluate the vascular anatomy. RADIATION DOSE REDUCTION: This exam was performed according to the departmental dose-optimization program which includes automated exposure control, adjustment of the mA and/or kV according to patient size and/or use of iterative reconstruction technique. CONTRAST:  75mL OMNIPAQUE  IOHEXOL  350 MG/ML SOLN COMPARISON:  CT angiogram of the chest performed Mar 21, 2024 FINDINGS: Cardiovascular: Heart size is unchanged. No pericardial effusion. Atherosclerotic changes in the left and right coronary territories. There is no evidence of  pulmonary embolism to the segmental level. Thoracic aorta is unchanged estimated at 4 cm. Main pulmonary artery: Within normal limits for diameter. Mediastinum/Nodes: The previously observed right hilar node is not significantly changed. Thyroid  is unchanged. Mildly prominent lymph nodes are present in the left axilla which may be reactive. Lungs/Pleura: Emphysema. No pleural effusion or pneumothorax. The previously observed patchy area of density in the right upper lobe is improved. There is residual density, however, this is small when compared to the previous exam. Image 65 of series 4. unchanged 5 mm noncalcified nodule in the right upper lobe on image 47 of series 4. Small nodule in the right lower lobe previously estimated at 4 mm current Leigh measures 3 mm on image 79 of series 4. Upper Abdomen: Unchanged. Musculoskeletal: Unchanged. Review of the MIP images confirms the above findings. IMPRESSION: 1. No evidence of pulmonary embolism to the segmental level. 2. Previously observed, suspected infectious infiltrate in the right upper lobe is improved. 3. Emphysema. 4. Similar appearance of additional findings. Based on the previous CT examination, continued CT follow-up in approximately 1 year is recommended. Electronically Signed   By: Reagan Camera M.D.   On: 04/10/2024 12:29   DG Chest Port 1 View Result Date: 04/10/2024 CLINICAL DATA:  Right-sided chest pain for 1 day EXAM: PORTABLE CHEST 1 VIEW COMPARISON:  04/08/2024 FINDINGS: Cardiac shadow is stable. Lungs are well aerated bilaterally. No focal infiltrate or effusion is seen. No bony abnormality is noted. IMPRESSION: No acute abnormality noted. Electronically Signed   By: Violeta Grey M.D.   On: 04/10/2024 10:11   DG Chest 2 View Result Date: 04/08/2024 CLINICAL DATA:  Shortness of breath EXAM: CHEST - 2 VIEW COMPARISON:  Chest x-ray performed Mar 21, 2024 FINDINGS: Previously observed airspace infiltrate in the right upper lobe is not clearly  seen. Underlying changes of emphysema. Low lung volumes. IMPRESSION: 1. Resolution of previously observed right upper lung zone airspace infiltrate. 2. Underlying changes suggestive emphysema. 3. Low lung volumes. Electronically Signed   By: Reagan Camera M.D.   On: 04/08/2024 12:45     Assessment and Plan:  Chest pain/SOB Elevated troponin NSTEMI - very atypical chest pain. Sudden sharp pain in the center of the chest that lasts for a few seconds. Worse with movement - Chest CT showed no PE, suspected infectious infiltrate in the right upper lobe improved, emphysema. - HS trop 189. Continue to trend - continue IV heparin  - check echo - ischemic work-up pending echo. Not a good cath candidate given anemia and thrombocytopenia  Possible New onset Afib - noted to possibly have Afib HR 101 in the setting of suspected acute AML flare s/p IVF - initial EKG appears to be irregular, but may  have p waves. May also be ?flutter waves. Will review with MD - tele appears to show NSR, HR in the 80-90s - no evidence of SVT noted - CHADSVASC (agex2, PAD, CHF). Continue IV heparin   -continue PTA bisoprolol   AML Leukocytosis - WBC up to 66.1>116.7 2/2 AML - plan to initiate treatment June 2 - followed by oncology  Thrombocytopenia Anemia - Hgb 8.9. Hgb 10.9 2 weeks ago - plt 123 - suspected 2/2 Hydrea   Risk Assessment/Risk Scores:   CHA2DS2-VASc Score = 4   This indicates a 4.8% annual risk of stroke. The patient's score is based upon: CHF History: 1 HTN History: 0 Diabetes History: 0 Stroke History: 0 Vascular Disease History: 1 Age Score: 2 Gender Score: 0        For questions or updates, please contact  [facility] Please consult [email or web address] for contact info under    Signed, Odilon Cass Rebekah Canada, PA-C  04/10/2024 4:08 PM

## 2024-04-10 NOTE — ED Provider Notes (Signed)
 [facility] Provider Note    Event Date/Time   First MD Initiated Contact with Patient 04/10/24 [phone]     (approximate)   History   Chest Pain   HPI  Wilmar Prabhakar is a 89 y.o. male with a history of AML, COPD, chronic respiratory failure, PE, SVT, and CAD who presents with right-sided chest pain over the last several days that goes to his back.  It is worse when he moves around.  He reports some increased shortness of breath.  He has been on 2 L of oxygen for the last few weeks.  He denies any leg swelling.   The past medical records.  The patient was admitted to the hospitalist service earlier this month with hypoxia and shortness of breath and was treated for a right upper lobe pneumonia.   Physical Exam   Triage Vital Signs: ED Triage Vitals  Encounter Vitals Group     BP 04/10/24 0823 123/66     Systolic BP Percentile --      Diastolic BP Percentile --      Pulse Rate 04/10/24 0820 98     Resp 04/10/24 0820 20     Temp 04/10/24 0820 99.1 F (37.3 C)     Temp Source 04/10/24 0820 Oral     SpO2 --      Weight 04/10/24 0821 150 lb (68 kg)     Height 04/10/24 0821 5\' 9"  (1.753 m)     Head Circumference --      Peak Flow --      Pain Score 04/10/24 0820 7     Pain Loc --      Pain Education --      Exclude from Growth Chart --     Most recent vital signs: Vitals:   04/10/24 0901 04/10/24 1231  BP:    Pulse:    Resp:    Temp:  98.9 F (37.2 C)  SpO2: 92%      General: Alert, weak appearing, no distress.  CV:  Good peripheral perfusion.  Resp:  Slightly increased effort.  Diminished breath sounds bilaterally. Abd:  Soft and nontender.  No distention.  Other:  No peripheral edema.   ED Results / Procedures / Treatments   Labs (all labs ordered are listed, but only abnormal results are displayed) Labs Reviewed  COMPREHENSIVE METABOLIC PANEL WITH GFR - Abnormal; Notable for the following components:      Result Value    Potassium 3.4 (*)    Glucose, Bld 108 (*)    Creatinine, Ser 1.44 (*)    Calcium 8.8 (*)    Albumin 3.1 (*)    GFR, Estimated 47 (*)    All other components within normal limits  BRAIN NATRIURETIC PEPTIDE - Abnormal; Notable for the following components:   B Natriuretic Peptide 268.0 (*)    All other components within normal limits  CBC WITH DIFFERENTIAL/PLATELET - Abnormal; Notable for the following components:   WBC 116.7 (*)    RBC 2.85 (*)    Hemoglobin 8.9 (*)    HCT 27.9 (*)    RDW 16.1 (*)    Platelets 123 (*)    nRBC 1.0 (*)    Neutro Abs 11.7 (*)    Lymphs Abs 10.5 (*)    Monocytes Absolute 79.4 (*)    nRBC 2 (*)    All other components within normal limits  MAGNESIUM - Abnormal; Notable for the following components:   Magnesium  1.5 (*)    All other components within normal limits  TROPONIN I (HIGH SENSITIVITY) - Abnormal; Notable for the following components:   Troponin I (High Sensitivity) 71 (*)    All other components within normal limits  TROPONIN I (HIGH SENSITIVITY) - Abnormal; Notable for the following components:   Troponin I (High Sensitivity) 189 (*)    All other components within normal limits  RESP PANEL BY RT-PCR (RSV, FLU A&B, COVID)  [id number]  PATHOLOGIST SMEAR REVIEW  TSH  APTT  PROTIME-INR  HEPARIN  LEVEL (UNFRACTIONATED)     EKG  ED ECG REPORT I, Lind Repine, the attending physician, personally viewed and interpreted this ECG.  Date: 04/10/2024 EKG Time: 0820 Rate: 101 Rhythm: Atrial fibrillation QRS Axis: normal Intervals: Incomplete RBBB ST/T Wave abnormalities: normal Narrative Interpretation: no evidence of acute ischemia    RADIOLOGY  Chest x-ray: I independently viewed and interpreted the images; there is no focal consolidation or edema  CT angio chest:   IMPRESSION:  1. No evidence of pulmonary embolism to the segmental level.  2. Previously observed, suspected infectious infiltrate in the right  upper lobe is  improved.  3. Emphysema.  4. Similar appearance of additional findings. Based on the previous  CT examination, continued CT follow-up in approximately 1 year is  recommended.    PROCEDURES:  Critical Care performed: No  Procedures   MEDICATIONS ORDERED IN ED: Medications  acetaminophen  (TYLENOL ) tablet 650 mg (has no administration in time range)    Or  acetaminophen  (TYLENOL ) suppository 650 mg (has no administration in time range)  ondansetron  (ZOFRAN ) tablet 4 mg (has no administration in time range)    Or  ondansetron  (ZOFRAN ) injection 4 mg (has no administration in time range)  hydroxyurea  (HYDREA ) capsule 500 mg (has no administration in time range)  morphine  (PF) 2 MG/ML injection 2 mg (has no administration in time range)  morphine  (PF) 4 MG/ML injection 4 mg (has no administration in time range)  0.9 %  sodium chloride  infusion (has no administration in time range)  allopurinol (ZYLOPRIM) tablet 300 mg (has no administration in time range)  bisoprolol  (ZEBETA ) tablet 2.5 mg (has no administration in time range)  mirtazapine (REMERON) tablet 15 mg (has no administration in time range)  pantoprazole  (PROTONIX ) EC tablet 20 mg (has no administration in time range)  feeding supplement (ENSURE ENLIVE / ENSURE PLUS) liquid 237 mL (has no administration in time range)  multivitamin with minerals tablet 1 tablet (has no administration in time range)  albuterol  (PROVENTIL ) (2.5 MG/3ML) 0.083% nebulizer solution 2.5 mg (has no administration in time range)  nitroGLYCERIN (NITROSTAT) SL tablet 0.4 mg (has no administration in time range)  heparin  bolus via infusion 4,000 Units (has no administration in time range)  heparin  ADULT infusion 100 units/mL (25000 units/250mL) (has no administration in time range)  iohexol  (OMNIPAQUE ) 350 MG/ML injection 75 mL (75 mLs Intravenous Contrast Given 04/10/24 1108)  sodium chloride  0.9 % bolus 1,000 mL (1,000 mLs Intravenous New Bag/Given  04/10/24 1334)     IMPRESSION / MDM / ASSESSMENT AND PLAN / ED COURSE  I reviewed the triage vital signs and the nursing notes.  89 year old male with PMH as noted above presents with right-sided chest pain over the last several days.  On exam the vital signs are normal except for his O2 saturation; the waveform is somewhat unreliable but the O2 sat appeared to be in the 80s on his baseline 2 L.  The patient has increased work  of breathing and appears overall weak.  He has no significant edema.  Differential diagnosis includes, but is not limited to, recurrent or worsening pneumonia, pleural effusion, acute bronchitis, COVID or other viral syndrome, pulmonary embolism.  EKG also shows that the patient appears to be in new onset atrial fibrillation.  We will obtain chest x-ray, lab workup, likely CTA of the chest, and reassess.  Patient's presentation is most consistent with acute presentation with potential threat to life or bodily function.  The patient is on the cardiac monitor to evaluate for evidence of arrhythmia and/or significant heart rate changes.   ----------------------------------------- 1:12 PM on 04/10/2024 -----------------------------------------   Chest x-ray showed no acute findings.  CTA of the chest is negative for PE although does not demonstrate findings to explain the hypoxia.  Initial troponin was mildly elevated.  Repeat is more elevated, consistent with demand ischemia.  CBC shows significant increase in his leukocytosis to 116.  I consulted Dr. Adrian Alba from hematology/oncology who follows the patient.  He recommended continue hydroxyurea .  Given the hypoxia and the rising troponin the patient will need admission for further management.  I consulted Dr. Reinhold Carbine from the hospitalist service; based on our discussion she agrees to evaluate the patient for admission.  FINAL CLINICAL IMPRESSION(S) / ED DIAGNOSES   Final diagnoses:  Atypical chest pain  Acute on chronic  respiratory failure with hypoxia (HCC)     Rx / DC Orders   ED Discharge Orders     None        Note:  This document was prepared using Dragon voice recognition software and may include unintentional dictation errors.    Lind Repine, MD 04/10/24 1517

## 2024-04-10 NOTE — Assessment & Plan Note (Addendum)
 Acute flare Hydroxyurea  500 mg daily resumed on admission Sodium chloride  1 L bolus Recheck CBC in the a.m.

## 2024-04-10 NOTE — Progress Notes (Signed)
 Approximately 1605--Pt arrived from ED to room 238. Upon arrival, pt verbalizes continuous chest discomfort. MD notified. Arrived on 5L [place]. Pt connected to telemetry and VS obtained. Fall precautions in place. Family at bedside.   MD messaged by this RN regarding pt's chest pain. Chest pain scoring 9/10 on pain scale, unrelieved by PRN morphine . PRN sublingual nitroglycerin also given with little relief. Pt connected to continuous pulse oximetry per order--O2 sats upon assessment around 84%. Pt reports no SOB, no labored breathing. MD and RT at bedside. Pt O2 required titration to 12L HFNC, O2 sats now 94%. See new orders.

## 2024-04-10 NOTE — H&P (Addendum)
 History and Physical   Dylan Weber OZH:[id number] DOB: May 06, 1933 DOA: 04/10/2024  PCP: Will Hare, NP  Outpatient Specialists: Dr. Gollan, [facility] cardiology Patient coming from: home via EMS  I have personally briefly reviewed patient's old medical records in [facility] EMR.  Chief Concern: Chest pain, back pain  HPI: Dylan Weber is an 89 year old male with history of COPD, history of PE, AML, who presents emergency department for chief concerns of right-sided chest pain.  Patient was noted to have desaturation in the high 80s on baseline 2 L nasal cannula.  Vitals in the ED showed t 98.9, respiration rate 20, heart rate 99, blood pressure 123/66, SpO2 90% on 2 L nasal cannula and desatted to 88% on 2 L nasal cannula.  Serum sodium showed 135, potassium 3.4, chloride 99, bicarb 24, BUN of 20, serum creatinine 1.44, EGFR 47, nonfasting blood glucose 108, WBC elevated at 116.7, hemoglobin 8.9, platelets of 123.  COVID/influenza A/influenza B/RSV PCR were negative.  BNP was elevated to 68.  High sensitive 1/71.  ED treatment: None --------------------------------- At bedside, patient able to tell me his first and last name, age, location, current calendar year.  He reports that yesterday, he started having chest pain and his wife states that on Wednesday he developed back pain.  He went to the cancer center and they gave him fluid and the back pain improved.  He denies fever, chills, trauma to person, shortness of breath, dysuria, hematuria, nausea, vomiting, syncope, swelling of the lower extremity.  Social history: He lives at home with his wife.  He denies tobacco, recreational drug.  He endorses alcohol use, last drink was last night, 1 shot of gin.  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  ROS: Constitutional: no weight change, no fever ENT/Mouth: no sore throat, no rhinorrhea Eyes: no eye pain, no vision  changes Cardiovascular: + chest pain, no dyspnea,  no edema, no palpitations Respiratory: no cough, no sputum, no wheezing Gastrointestinal: no nausea, no vomiting, no diarrhea, no constipation Genitourinary: no urinary incontinence, no dysuria, no hematuria Musculoskeletal: no arthralgias, no myalgias Skin: no skin lesions, no pruritus, Neuro: + weakness, no loss of consciousness, no syncope Psych: no anxiety, no depression, + decrease appetite Heme/Lymph: no bruising, no bleeding  ED Course: Discussed with EDP, patient requiring hospitalization for chief concerns of AML flare.  Assessment/Plan  Principal Problem:   AML (acute myeloblastic leukemia) (HCC) Active Problems:   Hypoxia   Elevated troponin   CAD (coronary artery disease)   AML (acute myeloid leukemia) (HCC)   Atrial fibrillation, new onset (HCC)   Chest pain   Assessment and Plan:  * AML (acute myeloblastic leukemia) (HCC) Acute flare Hydroxyurea  500 mg daily resumed on admission Sodium chloride  1 L bolus Recheck CBC in the a.m.  Chest pain As needed nitroglycerin for chest pain ordered  Atrial fibrillation, new onset (HCC) Suspect secondary to demand ischemia, however, I will check TSH on admission  AML (acute myeloid leukemia) (HCC) Home hydroxyurea  dosing resumed on admission  Elevated troponin Suspect demand ischemia in setting of AML flare However in setting of chest pain endorsement and elevated BNP, I will order a complete echo on admission  Addendum: Repeat high sensitive troponin had positive delta, from 71-189.  Complete echo ordered.  Heparin  bolus and gtt. initiated on admission.  Cardiology consulted  Hypoxia query secondary to AML flare vs NSTEMI Continue oxygen supplementation to maintain SpO2 greater than 92% Continuous pulse  oximetry  Chart reviewed.   DVT prophylaxis: Enoxaparin  Code Status: Full code Diet: Heart healthy Family Communication: Updated spouse at bedside with  patient's permission Disposition Plan: Pending clinical course Consults called: EDP discussed case with medical oncology/hematology, Dr. Adrian Alba Admission status: Telemetry medical, inpatient  Past Medical History:  Diagnosis Date   Basal cell carcinoma 06/19/2022   Dorsum nose. Nodular. EDC 07/17/2022   BCC (basal cell carcinoma) 10/11/2022   right nasal supratip, superficial and nodular, EDC 12/19/22   COPD (chronic obstructive pulmonary disease) (HCC)    Coronary artery calcification seen on CT scan 11/17/2021   COVID-19 10/2021   Diastolic dysfunction    a. 11/2021 Echo: EF 55-60%, no rwma, GrI DD, nl RV fxn. Mild AI.   GERD (gastroesophageal reflux disease)    Pulmonary embolism (HCC)    a. 11/2021 CTA Chest: small peripheral PE in RLL branch of PA-->Eliquis .   Right leg DVT (HCC)    a. 11/2021 LE U/S: occlusive R calf vein DVT in R peroneal vein-->eliquis .   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 [facility] 07/17/2022   SVT (supraventricular tachycardia) (HCC)    a. Dx 10/2021 in setting of COVID infxn.   Past Surgical History:  Procedure Laterality Date   APPENDECTOMY     CHOLECYSTECTOMY     EYE SURGERY     Social History:  reports that he has quit smoking. His smoking use included cigarettes. He has quit using smokeless tobacco. He reports current alcohol use of about 7.0 standard drinks of alcohol per week. He reports that he does not use drugs.  Allergies  Allergen Reactions   Pregabalin Anaphylaxis   Diltiazem  Other (See Comments)    Flushing/redness to body   Prednisone  Other (See Comments)    jittery   Meloxicam Nausea Only   Amoxicillin Rash   Sulfamethoxazole-Trimethoprim Rash   Family History  Problem Relation Age of Onset   Uterine cancer Mother    Brain cancer Sister    Family history: Family history reviewed and not pertinent.  Prior to  Admission medications   Medication Sig Start Date End Date Taking? Authorizing Provider  acetaminophen  (TYLENOL ) 650 MG CR tablet Take 650 mg by mouth as needed for pain.    [provider]  albuterol  (VENTOLIN  HFA) 108 (90 Base) MCG/ACT inhaler Inhale 2 puffs into the lungs every 6 (six) hours as needed for wheezing or shortness of breath. 11/01/21   Verla Glaze, MD  allopurinol (ZYLOPRIM) 300 MG tablet Take 1 tablet (300 mg total) by mouth daily. Start on 04/09/24. 04/02/24   Leonard, Alyson N, RPH-CPP  bisoprolol  (ZEBETA ) 5 MG tablet TAKE 1 TABLET (5 MG TOTAL) BY MOUTH IN THE MORNING AND AT BEDTIME 03/04/24   Gollan, Timothy J, MD  ciclopirox  (LOPROX ) 0.77 % SUSP Apply to toenails QD-BID. 10/11/22   Moye, Virginia , MD  COMBIVENT  RESPIMAT 20-100 MCG/ACT AERS respimat Inhale 1 puff into the lungs every 6 (six) hours as needed.    [provider]  feeding supplement (ENSURE ENLIVE / ENSURE PLUS) LIQD Take 237 mLs by mouth 2 (two) times daily between meals. 03/26/24   Amin, Sumayya, MD  HYDROcodone-acetaminophen  (NORCO/VICODIN) 5-325 MG tablet Take 1 tablet by mouth every 8 (eight) hours as needed for moderate pain (pain score 4-6). 04/08/24   Borders, Carlene Che, NP  hydroxyurea  (HYDREA ) 500 MG capsule Take 1 capsule (500  mg total) by mouth daily. May take with food to minimize GI side effects. Patient not taking: Reported on 04/08/2024 03/26/24   Brahmanday, Govinda R, MD  mirtazapine (REMERON) 15 MG tablet Take 1 tablet (15 mg total) by mouth at bedtime. 04/01/24   Shellie Dials, MD  Multiple Vitamin (MULTIVITAMIN) capsule Take 1 capsule by mouth daily.    [provider]  mupirocin  ointment (BACTROBAN ) 2 % Apply 1 Application topically daily. 02/20/23   Moye, Virginia , MD  ondansetron  (ZOFRAN ) 8 MG tablet One pill every 8 hours as needed for nausea/vomitting. Patient not taking: Reported on 04/08/2024 03/26/24   Brahmanday, Govinda R, MD  ondansetron  (ZOFRAN ) 8 MG tablet  Take 1 tablet (8 mg total) by mouth every 8 (eight) hours as needed for nausea or vomiting. Patient not taking: Reported on 04/08/2024 04/01/24   Shellie Dials, MD  pantoprazole  (PROTONIX ) 20 MG tablet Take 20 mg by mouth daily.    [provider]  Probiotic Product (PROBIOTIC ADVANCED PO) Take by mouth daily.    [provider]  prochlorperazine (COMPAZINE) 10 MG tablet Take 1 tablet (10 mg total) by mouth every 6 (six) hours as needed for nausea or vomiting. Patient not taking: Reported on 04/08/2024 04/01/24   Shellie Dials, MD  venetoclax (VENCLEXTA) 100 MG tablet Take 4 tablets (400 mg total) by mouth daily. Tablets should be swallowed whole with a meal and a full glass of water. Patient not taking: Reported on 04/08/2024 04/01/24   Shellie Dials, MD   Physical Exam: Vitals:   04/10/24 7425 04/10/24 9563 04/10/24 0901 04/10/24 1231  BP:  123/66    Pulse:      Resp:      Temp:    98.9 F (37.2 C)  TempSrc:    Oral  SpO2:   92%   Weight: 68 kg     Height: 5\' 9"  (1.753 m)      Constitutional: appears frail,  Eyes: PERRL, lids and conjunctivae normal ENMT: Mucous membranes are moist. Posterior pharynx clear of any exudate or lesions. Age-appropriate dentition.  Bilateral hearing loss with hearing aid in the right ear. Neck: normal, supple, no masses, no thyromegaly Respiratory: clear to auscultation bilaterally, no wheezing, no crackles. Normal respiratory effort. No accessory muscle use.  Cardiovascular: Regular rate and rhythm, no murmurs / rubs / gallops. No extremity edema. 2+ pedal pulses. No carotid bruits.  Abdomen: no tenderness, no masses palpated, no hepatosplenomegaly. Bowel sounds positive.  Musculoskeletal: no clubbing / cyanosis. No joint deformity upper and lower extremities. Good ROM, no contractures, no atrophy. Normal muscle tone.  Skin: no rashes, lesions, ulcers. No induration Neurologic: Sensation intact. Strength 5/5 in all 4.   Psychiatric: Normal judgment and insight. Alert and oriented x 3.  Agitated mood.   EKG: independently reviewed, showing atrial fibrillation with rate of 101, QTc 427  Chest x-ray on Admission: I personally reviewed and I agree with radiologist reading as below.  CT Angio Chest PE W and/or Wo Contrast Result Date: 04/10/2024 CLINICAL DATA:  Pulmonary embolism EXAM: CT ANGIOGRAPHY CHEST WITH CONTRAST TECHNIQUE: Multidetector CT imaging of the chest was performed using the standard protocol during bolus administration of intravenous contrast. Multiplanar CT image reconstructions and MIPs were obtained to evaluate the vascular anatomy. Multiplanar image (3D post-processing) reconstructions and MIPs were obtained to evaluate the vascular anatomy. RADIATION DOSE REDUCTION: This exam was performed according to the departmental dose-optimization program which includes automated exposure control, adjustment of the  mA and/or kV according to patient size and/or use of iterative reconstruction technique. CONTRAST:  75mL OMNIPAQUE  IOHEXOL  350 MG/ML SOLN COMPARISON:  CT angiogram of the chest performed Mar 21, 2024 FINDINGS: Cardiovascular: Heart size is unchanged. No pericardial effusion. Atherosclerotic changes in the left and right coronary territories. There is no evidence of pulmonary embolism to the segmental level. Thoracic aorta is unchanged estimated at 4 cm. Main pulmonary artery: Within normal limits for diameter. Mediastinum/Nodes: The previously observed right hilar node is not significantly changed. Thyroid  is unchanged. Mildly prominent lymph nodes are present in the left axilla which may be reactive. Lungs/Pleura: Emphysema. No pleural effusion or pneumothorax. The previously observed patchy area of density in the right upper lobe is improved. There is residual density, however, this is small when compared to the previous exam. Image 65 of series 4. unchanged 5 mm noncalcified nodule in the right upper  lobe on image 47 of series 4. Small nodule in the right lower lobe previously estimated at 4 mm current Leigh measures 3 mm on image 79 of series 4. Upper Abdomen: Unchanged. Musculoskeletal: Unchanged. Review of the MIP images confirms the above findings. IMPRESSION: 1. No evidence of pulmonary embolism to the segmental level. 2. Previously observed, suspected infectious infiltrate in the right upper lobe is improved. 3. Emphysema. 4. Similar appearance of additional findings. Based on the previous CT examination, continued CT follow-up in approximately 1 year is recommended. Electronically Signed   By: Reagan Camera M.D.   On: 04/10/2024 12:29   DG Chest Port 1 View Result Date: 04/10/2024 CLINICAL DATA:  Right-sided chest pain for 1 day EXAM: PORTABLE CHEST 1 VIEW COMPARISON:  04/08/2024 FINDINGS: Cardiac shadow is stable. Lungs are well aerated bilaterally. No focal infiltrate or effusion is seen. No bony abnormality is noted. IMPRESSION: No acute abnormality noted. Electronically Signed   By: Violeta Grey M.D.   On: 04/10/2024 10:11   Labs on Admission: I have personally reviewed following labs  CBC: Recent Labs  Lab 04/08/24 0853 04/10/24 0823  WBC 66.1* 116.7*  NEUTROABS 6.6 11.7*  HGB 9.2* 8.9*  HCT 30.2* 27.9*  MCV 100.7* 97.9  PLT 136* 123*   Basic Metabolic Panel: Recent Labs  Lab 04/08/24 0853 04/10/24 0823  NA 136 135  K 3.7 3.4*  CL 100 99  CO2 24 24  GLUCOSE 139* 108*  BUN 24* 20  CREATININE 1.50* 1.44*  CALCIUM 8.6* 8.8*   GFR: Estimated Creatinine Clearance: 34.1 mL/min (A) (by C-G formula based on SCr of 1.44 mg/dL (H)). Liver Function Tests: Recent Labs  Lab 04/08/24 0853 04/10/24 0823  AST 35 37  ALT 24 22  ALKPHOS 59 51  BILITOT 0.5 0.7  PROT 8.3* 8.1  ALBUMIN 3.3* 3.1*   Urine analysis:    Component Value Date/Time   COLORURINE YELLOW (A) 03/21/2024 0559   APPEARANCEUR CLEAR (A) 03/21/2024 0559   LABSPEC 1.043 (H) 03/21/2024 0559   PHURINE 5.0  03/21/2024 0559   GLUCOSEU NEGATIVE 03/21/2024 0559   HGBUR NEGATIVE 03/21/2024 0559   BILIRUBINUR NEGATIVE 03/21/2024 0559   KETONESUR NEGATIVE 03/21/2024 0559   PROTEINUR 30 (A) 03/21/2024 0559   NITRITE NEGATIVE 03/21/2024 0559   LEUKOCYTESUR NEGATIVE 03/21/2024 0559   CRITICAL CARE Performed by: Trenton Frock Ciarrah Rae  Total critical care time: 32 minutes  Critical care time was exclusive of separately billable procedures and treating other patients.  Critical care was necessary to treat or prevent imminent or life-threatening deterioration.  Critical care was  time spent personally by me on the following activities: development of treatment plan with patient and spouse, as well as nursing, discussions with consultants, evaluation of patient's response to treatment, examination of patient, obtaining history from patient or surrogate, ordering and performing treatments and interventions, ordering and review of laboratory studies, ordering and review of radiographic studies, pulse oximetry and re-evaluation of patient's condition.  This document was prepared using Dragon Voice Recognition software and may include unintentional dictation errors.  Dr. Reinhold Carbine [facility]  If 7PM-7AM, please contact overnight-coverage provider If 7AM-7PM, please contact day attending provider [email or web address]  04/10/2024, 2:58 PM

## 2024-04-11 DIAGNOSIS — C9200 Acute myeloblastic leukemia, not having achieved remission: Secondary | ICD-10-CM | POA: Diagnosis not present

## 2024-04-11 DIAGNOSIS — Z515 Encounter for palliative care: Secondary | ICD-10-CM | POA: Diagnosis not present

## 2024-04-11 DIAGNOSIS — J9621 Acute and chronic respiratory failure with hypoxia: Secondary | ICD-10-CM

## 2024-04-11 DIAGNOSIS — Z7189 Other specified counseling: Secondary | ICD-10-CM

## 2024-04-11 DIAGNOSIS — Z789 Other specified health status: Secondary | ICD-10-CM | POA: Diagnosis not present

## 2024-04-11 DIAGNOSIS — R0789 Other chest pain: Secondary | ICD-10-CM

## 2024-04-11 DIAGNOSIS — I2489 Other forms of acute ischemic heart disease: Secondary | ICD-10-CM

## 2024-04-11 LAB — ECHOCARDIOGRAM COMPLETE
AR max vel: 3.15 cm2
AV Area VTI: 2.71 cm2
AV Area mean vel: 2.75 cm2
AV Mean grad: 8 mmHg
AV Peak grad: 13.8 mmHg
Ao pk vel: 1.86 m/s
Area-P 1/2: 3.16 cm2
Height: 69 in
P 1/2 time: 280 ms
S' Lateral: 2.9 cm
Weight: 2400 [oz_av]

## 2024-04-11 LAB — BASIC METABOLIC PANEL WITH GFR
Anion gap: 11 (ref 5–15)
BUN: 18 mg/dL (ref 8–23)
CO2: 21 mmol/L — ABNORMAL LOW (ref 22–32)
Calcium: 7.9 mg/dL — ABNORMAL LOW (ref 8.9–10.3)
Chloride: 105 mmol/L (ref 98–111)
Creatinine, Ser: 1.36 mg/dL — ABNORMAL HIGH (ref 0.61–1.24)
GFR, Estimated: 50 mL/min — ABNORMAL LOW (ref >60)
Glucose, Bld: 91 mg/dL (ref 70–99)
Potassium: 2.9 mmol/L — ABNORMAL LOW (ref 3.5–5.1)
Sodium: 137 mmol/L (ref 135–145)

## 2024-04-11 LAB — HEPARIN LEVEL (UNFRACTIONATED)
Heparin Unfractionated: <0.1 [IU]/mL — ABNORMAL LOW (ref 0.30–0.70)
Heparin Unfractionated: 0.1 [IU]/mL — ABNORMAL LOW (ref 0.30–0.70)

## 2024-04-11 LAB — SEDIMENTATION RATE: Sed Rate: 127 mm/h — ABNORMAL HIGH (ref 0–20)

## 2024-04-11 LAB — CBC
HCT: 26.1 % — ABNORMAL LOW (ref 39.0–52.0)
Hemoglobin: 8.1 g/dL — ABNORMAL LOW (ref 13.0–17.0)
MCH: 30.8 pg (ref 26.0–34.0)
MCHC: 31 g/dL (ref 30.0–36.0)
MCV: 99.2 fL (ref 80.0–100.0)
Platelets: 97 10*3/uL — ABNORMAL LOW (ref 150–400)
RBC: 2.63 MIL/uL — ABNORMAL LOW (ref 4.22–5.81)
RDW: 16.2 % — ABNORMAL HIGH (ref 11.5–15.5)
WBC: 105 10*3/uL (ref 4.0–10.5)
nRBC: 0.9 % — ABNORMAL HIGH (ref 0.0–0.2)

## 2024-04-11 LAB — TROPONIN I (HIGH SENSITIVITY): Troponin I (High Sensitivity): 236 ng/L

## 2024-04-11 LAB — C-REACTIVE PROTEIN: CRP: 11.2 mg/dL — ABNORMAL HIGH (ref <1.0)

## 2024-04-11 LAB — MAGNESIUM: Magnesium: 2.3 mg/dL (ref 1.7–2.4)

## 2024-04-11 MED ORDER — HEPARIN BOLUS VIA INFUSION
2000.0000 [IU] | Freq: Once | INTRAVENOUS | Status: AC
  Administered 2024-04-11: 2000 [IU] via INTRAVENOUS
  Filled 2024-04-11: qty 2000

## 2024-04-11 MED ORDER — POLYETHYLENE GLYCOL 3350 17 G PO PACK: 17.0000 g | PACK | Freq: Every day | ORAL | Status: DC

## 2024-04-11 MED ORDER — COLCHICINE 0.6 MG PO TABS: 0.6000 mg | ORAL_TABLET | Freq: Two times a day (BID) | ORAL | Status: DC

## 2024-04-11 MED ORDER — POLYETHYLENE GLYCOL 3350 17 G PO PACK
17.0000 g | PACK | Freq: Every day | ORAL | Status: DC
  Filled 2024-04-11: qty 1

## 2024-04-11 MED ORDER — POTASSIUM CHLORIDE 20 MEQ PO PACK
80.0000 meq | PACK | Freq: Once | ORAL | Status: AC
  Administered 2024-04-11: 80 meq via ORAL
  Filled 2024-04-11: qty 4

## 2024-04-11 NOTE — TOC Initial Note (Signed)
 Transition of Care [facility]) - Initial/Assessment Note    Patient Details  Name: Dylan Weber MRN: [id number] Date of Birth: 02/28/1933  Transition of Care [facility]) CM/SW Contact:    Alexandra Ice, RN Phone Number: 04/11/2024, 3:28 PM  Clinical Narrative:                  Patient lives spouse. Has DME at home - wheelchair, RW, tub bench. He has PCP in the community for follow-up once discharged. No TOC needs identified at this time, will continue to monitor.       Patient Goals and CMS Choice            Expected Discharge Plan and Services                                              Prior Living Arrangements/Services                       Activities of Daily Living   ADL Screening (condition at time of admission) Independently performs ADLs?: Yes (appropriate for developmental age) Is the patient deaf or have difficulty hearing?: Yes Does the patient have difficulty seeing, even when wearing glasses/contacts?: No Does the patient have difficulty concentrating, remembering, or making decisions?: No  Permission Sought/Granted                  Emotional Assessment              Admission diagnosis:  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] Atypical chest pain [R07.89] Acute on chronic respiratory failure with hypoxia (HCC) [J96.21] Patient Active Problem List   Diagnosis Date Noted   Demand ischemia (HCC) 04/11/2024   AML (acute myeloblastic leukemia) (HCC) 04/10/2024   Atrial fibrillation, new onset (HCC) 04/10/2024   Atypical chest pain 04/10/2024   AML (acute myeloid leukemia) (HCC) 04/01/2024   Community acquired pneumonia 03/26/2024   Acute respiratory failure with hypoxia (HCC) 03/21/2024   Leukocytosis 03/21/2024   Squamous cell carcinoma of skin 07/16/2023   CAD (coronary artery disease) 12/31/2021   Near syncope 11/24/2021   Pulmonary embolism (HCC) 11/24/2021   Elevated troponin 11/24/2021    Hypoxia 11/01/2021   Acute on chronic respiratory failure with hypoxia (HCC)    SVT (supraventricular tachycardia) (HCC)    COPD (chronic obstructive pulmonary disease) (HCC)    Gastroesophageal reflux disease without esophagitis    COVID-19 virus infection 10/31/2021   PCP:  Will Hare, NP Pharmacy:   [facility] [id number] [place], [place] - [address] [place] [address] Phone: [phone] Fax: [phone]  [place] [facility] [address] [place] [address] Phone: [phone] Fax: [phone]     Social Drivers of Health (SDOH) Social History: SDOH Screenings   Food Insecurity: No Food Insecurity (04/10/2024)  Housing: Low Risk  (04/10/2024)  Transportation Needs: No Transportation Needs (04/10/2024)  Utilities: Not At Risk (04/10/2024)  Depression (PHQ2-9): Low Risk  (07/16/2023)  Financial Resource Strain: Low Risk  (12/02/2023)   Received from [facility]  Social Connections: Socially Isolated (04/10/2024)  Tobacco Use: Medium Risk (04/10/2024)   SDOH Interventions:     Readmission Risk Interventions     No data to display

## 2024-04-11 NOTE — Evaluation (Signed)
 Occupational Therapy Evaluation Patient Details Name: Dylan Weber MRN: [id number] DOB: 1933-07-23 Today's Date: 04/11/2024   History of Present Illness   Dylan Weber is an 89 year old male with history of COPD, history of PE, AML, who presents emergency department for chief concerns of right-sided chest pain. Per care everywhere on 03/12/24 pt presented with fall and L wrist pain: "No definite acute bony injury plain films left wrist. Thumb spica wrist brace dispensed."    Clinical Impressions Dylan Weber was seen for OT evaluation this date. Prior to hospital admission, pt was requiring assist for ADLs due to recent L wrist injury and recent weakness however at baseline ambulatory with [facility]. Pt lives with spouse in level entry home. Pt currently requires CGA for bed mobility. MIN A + HHA sit<>stand x2 trials and side steps along bed. MOD A don underwear. CGA standing grooming tasks. SpO2 95-100% on 8L [place] with activity. Pt would benefit from skilled OT to address noted impairments and functional limitations (see below for any additional details). Upon hospital discharge, recommend OT follow up.     If plan is discharge home, recommend the following:   Help with stairs or ramp for entrance;A little help with bathing/dressing/bathroom;A little help with walking and/or transfers     Functional Status Assessment   Patient has had a recent decline in their functional status and demonstrates the ability to make significant improvements in function in a reasonable and predictable amount of time.     Equipment Recommendations   BSC/3in1     Recommendations for Other Services         Precautions/Restrictions   Precautions Precautions: Fall Recall of Precautions/Restrictions: Impaired Required Braces or Orthoses: Other Brace Other Brace: thumb spica wrist splint Restrictions Weight Bearing Restrictions Per Provider Order: No Other Position/Activity Restrictions:  Spouse reports L wrist fx ~3 weeks ago however per care everywhere "No definite acute bony injury plain films left wrist"     Mobility Bed Mobility Overal bed mobility: Needs Assistance Bed Mobility: Supine to Sit, Sit to Supine     Supine to sit: Contact guard Sit to supine: Contact guard assist        Transfers Overall transfer level: Needs assistance Equipment used: 1 person hand held assist Transfers: Sit to/from Stand Sit to Stand: Min assist                  Balance Overall balance assessment: Needs assistance Sitting-balance support: No upper extremity supported, Feet supported Sitting balance-Leahy Scale: Good     Standing balance support: Single extremity supported Standing balance-Leahy Scale: Fair                             ADL either performed or assessed with clinical judgement   ADL Overall ADL's : Needs assistance/impaired                                       General ADL Comments: MOD A don underwear. CGA standing grooming tasks, MIN A + HHA for simualted BSC t/f.      Vision         Perception         Praxis         Pertinent Vitals/Pain Pain Assessment Pain Assessment: Faces Faces Pain Scale: Hurts little more Pain Location: back Pain Descriptors / Indicators: Discomfort, Grimacing  Pain Intervention(s): Limited activity within patient's tolerance, Repositioned     Extremity/Trunk Assessment Upper Extremity Assessment Upper Extremity Assessment: Right hand dominant;LUE deficits/detail;Generalized weakness LUE Deficits / Details: pt wearing thumb spica splint however fully weight bearing thru L wrist for trasnfers w/out c/o of pain LUE: Unable to fully assess due to immobilization   Lower Extremity Assessment Lower Extremity Assessment: Generalized weakness       Communication Communication Communication: Impaired Factors Affecting Communication: Hearing impaired   Cognition Arousal:  Alert Behavior During Therapy: WFL for tasks assessed/performed Cognition: No apparent impairments             OT - Cognition Comments: age appropriate                 Following commands: Intact       Cueing  General Comments   Cueing Techniques: Verbal cues  SpO2 95-100% on 8L [place] with activity   Exercises     Shoulder Instructions      Home Living Family/patient expects to be discharged to:: Private residence Living Arrangements: Spouse/significant other Available Help at Discharge: Family Type of Home: House Home Access: Level entry     Home Layout: One level     Bathroom Shower/Tub: Walk-in shower         Home Equipment: Tub bench;Rolling Environmental consultant (2 wheels);Wheelchair - manual          Prior Functioning/Environment Prior Level of Function : Needs assist             Mobility Comments: on recent eval 03/23/24 pt ambulatory with [facility], spouse reports since return home requiring RW or w/c due to weakness ADLs Comments: assist for dressing 2/2 L wrist injury    OT Problem List: Decreased strength;Decreased range of motion;Decreased activity tolerance   OT Treatment/Interventions: Self-care/ADL training;Energy conservation;DME and/or AE instruction;Therapeutic exercise;Therapeutic activities      OT Goals(Current goals can be found in the care plan section)   Acute Rehab OT Goals Patient Stated Goal: to regain strength OT Goal Formulation: With patient/family Time For Goal Achievement: 04/25/24 Potential to Achieve Goals: Fair ADL Goals Pt Will Perform Grooming: with modified independence;standing Pt Will Perform Lower Body Dressing: sit to/from stand;with caregiver independent in assisting;with modified independence Pt Will Transfer to Toilet: with modified independence;ambulating;regular height toilet   OT Frequency:  Min 1X/week    Co-evaluation              AM-PAC OT "6 Clicks" Daily Activity     Outcome Measure Help from  another person eating meals?: None Help from another person taking care of personal grooming?: A Little Help from another person toileting, which includes using toliet, bedpan, or urinal?: A Little Help from another person bathing (including washing, rinsing, drying)?: A Lot Help from another person to put on and taking off regular upper body clothing?: A Little Help from another person to put on and taking off regular lower body clothing?: A Lot 6 Click Score: 17   End of Session Equipment Utilized During Treatment: Oxygen  Activity Tolerance: Patient tolerated treatment well Patient left: in bed;with call bell/phone within reach;with bed alarm set;with family/visitor present  OT Visit Diagnosis: Other abnormalities of gait and mobility (R26.89);Muscle weakness (generalized) (M62.81)                Time: 0981-1914 OT Time Calculation (min): 19 min Charges:  OT General Charges $OT Visit: 1 Visit OT Evaluation $OT Eval Moderate Complexity: 1 Mod OT Treatments $Self Care/Home Management :  8-22 mins  Gordan Latina, M.S. OTR/L  04/11/24, 1:17 PM  ascom [phone]

## 2024-04-11 NOTE — Discharge Summary (Signed)
 Physician Discharge Summary   Dylan: Dylan Weber MRN: [id number] DOB: Aug 31, 1933  Admit date:     04/10/2024  Discharge date: 04/11/24  Discharge Physician: Luna Salinas   PCP: Will Hare, NP   Recommendations at discharge:  Dylan with concern of blast crisis with new diagnosis of AML being transferred to [facility]  Discharge Diagnoses: Principal Problem:   AML (acute myeloblastic leukemia) (HCC) Active Problems:   Hypoxia   Elevated troponin   CAD (coronary artery disease)   AML (acute myeloid leukemia) (HCC)   Atrial fibrillation, new onset (HCC)   Atypical chest pain   Demand ischemia (HCC)  Resolved Problems:   * No resolved hospital problems. [facility] Course: Dylan Weber is an 89 year old male with history of COPD, history of PE, AML, who presents emergency department for chief concerns of right-sided chest pain.  Dylan was noted to have desaturation in the high 80s on baseline 2 L nasal cannula.  Vitals in the ED showed T 98.9, rr 20, heart rate 99, blood pressure 123/66, SpO2 90% on 2 L nasal cannula and desatted to 88% on 2 L nasal cannula.  Serum sodium showed 135, potassium 3.4, chloride 99, bicarb 24, BUN of 20, serum creatinine 1.44, EGFR 47, nonfasting blood glucose 108, WBC elevated at 116.7, hemoglobin 8.9, platelets of 123.  COVID/influenza A/influenza B/RSV PCR were negative.  BNP was elevated to 68.  High sensitive troponin 71>> 189.  Dylan with recent diagnosis of AML, currently on hydroxyurea  and the plan to start chemo on Monday, 12/15/2023.  Cardiology and oncology both are consulted and Dylan was placed on heparin  infusion.  Also started on colchicine  for concern of pericarditis.  5/31: Vital stable on 12 L of oxygen, slowly increasing troponin currently at 236,WBC at 105, hemoglobin decreased to 8.1 and worsening of thrombocytopenia with platelet at 97, potassium at 2.9 with magnesium  of 2.2, replacing potassium.   Mild metabolic acidosis with bicarb of 21, some improvement in creatinine to 1.36.  Blood smear with 55% blasts.  Dylan was seen at cancer center on Wednesday 5/28 with rising WBC at 66 and chest pain and was given IV fluid, 13% blasts were noted during that visit and plan was to start chemo on Monday.  Now presented with worsening leukocytosis above 100,000 and 55% of blasts.  Dylan likely having acute chest with blast crisis.  Very high mortality especially in his age group.  Palliative care was also consulted.  Dylan need to be at a tertiary care center as we will not be able to provide the necessary care at current facility.  Luckily he got accepted at [facility] by on-call oncology for further management of his blast crisis.  Dylan is being transferred to [facility] for further management.   Consultants: Cardiology.  Oncology Procedures performed: None Disposition: [facility] Diet recommendation:  Discharge Diet Orders (From admission, onward)     Start     Ordered   04/11/24 0000  Diet - low sodium heart healthy        04/11/24 1654            DISCHARGE MEDICATION: Allergies as of 04/11/2024       Reactions   Pregabalin Anaphylaxis   Diltiazem  Other (See Comments)   Flushing/redness to body   Prednisone  Other (See Comments)   jittery   Meloxicam Nausea Only   Amoxicillin Rash   Sulfamethoxazole-trimethoprim Rash        Medication List  STOP taking these medications    prochlorperazine  10 MG tablet Commonly known as: COMPAZINE        TAKE these medications    acetaminophen  650 MG CR tablet Commonly known as: TYLENOL  Take 650 mg by mouth as needed for pain.   albuterol  108 (90 Base) MCG/ACT inhaler Commonly known as: VENTOLIN  HFA Inhale 2 puffs into the lungs every 6 (six) hours as needed for wheezing or shortness of breath.   allopurinol  300 MG tablet Commonly known as: Zyloprim  Take 1 tablet (300 mg total) by mouth daily. Start on 04/09/24.    bisoprolol  5 MG tablet Commonly known as: ZEBETA  TAKE 1 TABLET (5 MG TOTAL) BY MOUTH IN THE MORNING AND AT BEDTIME What changed: See the new instructions.   ciclopirox  0.77 % Susp Commonly known as: LOPROX  Apply to toenails QD-BID.   colchicine  0.6 MG tablet Take 1 tablet (0.6 mg total) by mouth 2 (two) times daily.   Combivent  Respimat 20-100 MCG/ACT Aers respimat Generic drug: Ipratropium-Albuterol  Inhale 1 puff into the lungs every 6 (six) hours as needed.   feeding supplement Liqd Take 237 mLs by mouth 2 (two) times daily between meals.   HYDROcodone -acetaminophen  5-325 MG tablet Commonly known as: NORCO/VICODIN Take 1 tablet by mouth every 8 (eight) hours as needed for moderate pain (pain score 4-6).   hydroxyurea  500 MG capsule Commonly known as: HYDREA  Take 1 capsule (500 mg total) by mouth daily. May take with food to minimize GI side effects.   mirtazapine  15 MG tablet Commonly known as: Remeron  Take 1 tablet (15 mg total) by mouth at bedtime.   multivitamin capsule Take 1 capsule by mouth daily.   mupirocin  ointment 2 % Commonly known as: BACTROBAN  Apply 1 Application topically daily.   ondansetron  8 MG tablet Commonly known as: Zofran  Take 1 tablet (8 mg total) by mouth every 8 (eight) hours as needed for nausea or vomiting. What changed: Another medication with the same name was removed. Continue taking this medication, and follow the directions you see here.   pantoprazole  20 MG tablet Commonly known as: PROTONIX  Take 20 mg by mouth daily.   polyethylene glycol 17 g packet Commonly known as: MIRALAX  / GLYCOLAX  Take 17 g by mouth daily.   PROBIOTIC ADVANCED PO Take by mouth daily.   Venclexta  100 MG tablet Generic drug: venetoclax  Take 4 tablets (400 mg total) by mouth daily. Tablets should be swallowed whole with a meal and a full glass of water.        Discharge Exam: Filed Weights   04/10/24 0821  Weight: 68 kg   General.  Frail and  ill-appearing elderly man, in no acute distress. Pulmonary.  Lungs clear bilaterally, mildly increased work of breathing CV.  Mild sinus tachycardia Abdomen.  Soft, nontender, nondistended, BS positive. CNS.  Alert and oriented .  No focal neurologic deficit. Extremities.  No edema, no cyanosis, pulses intact and symmetrical.  Condition at discharge: fair  The results of significant diagnostics from this hospitalization (including imaging, microbiology, ancillary and laboratory) are listed below for reference.   Imaging Studies: ECHOCARDIOGRAM COMPLETE Result Date: 04/11/2024    ECHOCARDIOGRAM REPORT   Dylan Weber [facility] Date of Exam: 04/10/2024 Medical Rec #:  [id number]              Height:       69.0 in Accession #:    [id number]             Weight:  150.0 lb Date of Birth:  1933/03/31              BSA:          1.828 m Dylan Age:    89 years               BP:           123/66 mmHg Dylan Gender: M                      HR:           121 bpm. Exam Location:  [facility] Procedure: 2D Echo, Cardiac Doppler and Color Doppler (Both Spectral and Color            Flow Doppler were utilized during procedure). Indications:     Elevated Troponin                  Chest Pain R07.9  History:         Dylan has prior history of Echocardiogram examinations, most                  recent 11/25/2021. CAD, COPD; Arrythmias:Atrial Fibrillation and                  Tachycardia. Pulmonary Embolus.  Sonographer:     Terrilee Few RCS Referring Phys:  [id number] AMY N COX Diagnosing Phys: Maudine Sos MD IMPRESSIONS  1. Left ventricular ejection fraction, by estimation, is 60 to 65%. The left ventricle has normal function. The left ventricle has no regional wall motion abnormalities. Left ventricular diastolic parameters are consistent with Grade I diastolic dysfunction (impaired relaxation).  2. Right ventricular systolic function is normal. The right ventricular size is normal. There is normal  pulmonary artery systolic pressure.  3. The mitral valve is normal in structure. Trivial mitral valve regurgitation. No evidence of mitral stenosis.  4. The aortic valve is tricuspid. Aortic valve regurgitation is mild. No aortic stenosis is present.  5. Aortic dilatation noted. There is mild dilatation of the ascending aorta, measuring 41 mm. There is borderline dilatation of the aortic root, measuring 39 mm.  6. The inferior vena cava is dilated in size with >50% respiratory variability, suggesting right atrial pressure of 8 mmHg. FINDINGS  Left Ventricle: Left ventricular ejection fraction, by estimation, is 60 to 65%. The left ventricle has normal function. The left ventricle has no regional wall motion abnormalities. The left ventricular internal cavity size was normal in size. There is  no left ventricular hypertrophy. Left ventricular diastolic parameters are consistent with Grade I diastolic dysfunction (impaired relaxation). Indeterminate filling pressures. Right Ventricle: The right ventricular size is normal. No increase in right ventricular wall thickness. Right ventricular systolic function is normal. There is normal pulmonary artery systolic pressure. The tricuspid regurgitant velocity is 2.31 m/s, and  with an assumed right atrial pressure of 8 mmHg, the estimated right ventricular systolic pressure is 29.3 mmHg. Left Atrium: Left atrial size was normal in size. Right Atrium: Right atrial size was normal in size. Pericardium: There is no evidence of pericardial effusion. Mitral Valve: The mitral valve is normal in structure. Trivial mitral valve regurgitation. No evidence of mitral valve stenosis. Tricuspid Valve: The tricuspid valve is normal in structure. Tricuspid valve regurgitation is trivial. No evidence of tricuspid stenosis. Aortic Valve: The aortic valve is tricuspid. Aortic valve regurgitation is mild. Aortic regurgitation PHT measures 280 msec. No aortic stenosis is present. Aortic valve  mean  gradient measures 8.0 mmHg. Aortic valve peak gradient measures 13.8 mmHg. Aortic valve area, by VTI measures 2.71 cm. Pulmonic Valve: The pulmonic valve was normal in structure. Pulmonic valve regurgitation is not visualized. No evidence of pulmonic stenosis. Aorta: Aortic dilatation noted. There is mild dilatation of the ascending aorta, measuring 41 mm. There is borderline dilatation of the aortic root, measuring 39 mm. Venous: The inferior vena cava is dilated in size with greater than 50% respiratory variability, suggesting right atrial pressure of 8 mmHg. IAS/Shunts: No atrial level shunt detected by color flow Doppler.  LEFT VENTRICLE PLAX 2D LVIDd:         4.40 cm   Diastology LVIDs:         2.90 cm   LV e' medial:    7.18 cm/s LV PW:         1.20 cm   LV E/e' medial:  10.0 LV IVS:        0.90 cm   LV e' lateral:   9.32 cm/s LVOT diam:     2.30 cm   LV E/e' lateral: 7.7 LV SV:         74 LV SV Index:   40 LVOT Area:     4.15 cm  RIGHT VENTRICLE             IVC RV S prime:     27.10 cm/s  IVC diam: 2.20 cm TAPSE (M-mode): 2.0 cm LEFT ATRIUM           Index        RIGHT ATRIUM           Index LA diam:      4.60 cm 2.52 cm/m   RA Area:     17.60 cm LA Vol (A2C): 47.2 ml 25.82 ml/m  RA Volume:   39.70 ml  21.71 ml/m LA Vol (A4C): 14.6 ml 7.99 ml/m  AORTIC VALVE AV Area (Vmax):    3.15 cm AV Area (Vmean):   2.75 cm AV Area (VTI):     2.71 cm AV Vmax:           186.00 cm/s AV Vmean:          132.000 cm/s AV VTI:            0.271 m AV Peak Grad:      13.8 mmHg AV Mean Grad:      8.0 mmHg LVOT Vmax:         141.00 cm/s LVOT Vmean:        87.300 cm/s LVOT VTI:          0.177 m LVOT/AV VTI ratio: 0.65 AI PHT:            280 msec  AORTA Ao Root diam: 3.90 cm Ao Asc diam:  4.10 cm MITRAL VALVE               TRICUSPID VALVE MV Area (PHT): 3.16 cm    TR Peak grad:   21.3 mmHg MV Decel Time: 240 msec    TR Vmax:        231.00 cm/s MV E velocity: 71.60 cm/s MV A velocity: 97.80 cm/s  SHUNTS MV E/A ratio:   0.73        Systemic VTI:  0.18 m                            Systemic Diam: 2.30 cm Maudine Sos MD  Electronically signed by Maudine Sos MD Signature Date/Time: 04/11/2024/12:11:02 PM    Final    DG Chest Port 1 View Result Date: 04/10/2024 CLINICAL DATA:  Dyspnea on exertion. History of heart disease. COPD. Chronic respiratory failure on home oxygen. Previous history of pulmonary embolus and DVTs. EXAM: PORTABLE CHEST 1 VIEW COMPARISON:  04/10/2024 FINDINGS: Shallow inspiration. Cardiac enlargement. Linear atelectasis in the lung bases. No focal consolidation. No pleural effusion or pneumothorax. Calcified and tortuous aorta. IMPRESSION: Shallow inspiration with atelectasis in the lung bases. Mild cardiac enlargement. Electronically Signed   By: Boyce Byes M.D.   On: 04/10/2024 19:49   CT Angio Chest PE W and/or Wo Contrast Result Date: 04/10/2024 CLINICAL DATA:  Pulmonary embolism EXAM: CT ANGIOGRAPHY CHEST WITH CONTRAST TECHNIQUE: Multidetector CT imaging of the chest was performed using the standard protocol during bolus administration of intravenous contrast. Multiplanar CT image reconstructions and MIPs were obtained to evaluate the vascular anatomy. Multiplanar image (3D post-processing) reconstructions and MIPs were obtained to evaluate the vascular anatomy. RADIATION DOSE REDUCTION: This exam was performed according to the departmental dose-optimization program which includes automated exposure control, adjustment of the mA and/or kV according to Dylan size and/or use of iterative reconstruction technique. CONTRAST:  75mL OMNIPAQUE  IOHEXOL  350 MG/ML SOLN COMPARISON:  CT angiogram of the chest performed Mar 21, 2024 FINDINGS: Cardiovascular: Heart size is unchanged. No pericardial effusion. Atherosclerotic changes in the left and right coronary territories. There is no evidence of pulmonary embolism to the segmental level. Thoracic aorta is unchanged estimated at 4 cm. Main pulmonary  artery: Within normal limits for diameter. Mediastinum/Nodes: The previously observed right hilar node is not significantly changed. Thyroid  is unchanged. Mildly prominent lymph nodes are present in the left axilla which may be reactive. Lungs/Pleura: Emphysema. No pleural effusion or pneumothorax. The previously observed patchy area of density in the right upper lobe is improved. There is residual density, however, this is small when compared to the previous exam. Image 65 of series 4. unchanged 5 mm noncalcified nodule in the right upper lobe on image 47 of series 4. Small nodule in the right lower lobe previously estimated at 4 mm current Leigh measures 3 mm on image 79 of series 4. Upper Abdomen: Unchanged. Musculoskeletal: Unchanged. Review of the MIP images confirms the above findings. IMPRESSION: 1. No evidence of pulmonary embolism to the segmental level. 2. Previously observed, suspected infectious infiltrate in the right upper lobe is improved. 3. Emphysema. 4. Similar appearance of additional findings. Based on the previous CT examination, continued CT follow-up in approximately 1 year is recommended. Electronically Signed   By: Reagan Camera M.D.   On: 04/10/2024 12:29   DG Chest Port 1 View Result Date: 04/10/2024 CLINICAL DATA:  Right-sided chest pain for 1 day EXAM: PORTABLE CHEST 1 VIEW COMPARISON:  04/08/2024 FINDINGS: Cardiac shadow is stable. Lungs are well aerated bilaterally. No focal infiltrate or effusion is seen. No bony abnormality is noted. IMPRESSION: No acute abnormality noted. Electronically Signed   By: Violeta Grey M.D.   On: 04/10/2024 10:11   DG Chest 2 View Result Date: 04/08/2024 CLINICAL DATA:  Shortness of breath EXAM: CHEST - 2 VIEW COMPARISON:  Chest x-ray performed Mar 21, 2024 FINDINGS: Previously observed airspace infiltrate in the right upper lobe is not clearly seen. Underlying changes of emphysema. Low lung volumes. IMPRESSION: 1. Resolution of previously observed  right upper lung zone airspace infiltrate. 2. Underlying changes suggestive emphysema. 3. Low lung volumes. Electronically Signed  By: Reagan Camera M.D.   On: 04/08/2024 12:45   CT BONE MARROW BIOPSY & ASPIRATION Result Date: 03/26/2024 CLINICAL DATA:  Leukocytosis with mild glass on peripheral smear concerning for acute leukemia. EXAM: CT-guided bone marrow biopsy TECHNIQUE: CT pelvis CONTRAST:  None RADIOPHARMACEUTICALS:  None FLUOROSCOPY: 11.43 mGy COMPARISON:  None FINDINGS: The Dylan was placed in prone position on the CT gantry. Radiopaque markers were placed on the Dylan's skin and initial imaging of the pelvis was performed. The Dylan's skin was then prepped and draped in the usual sterile fashion. Moderate sedation was provided for by the nursing staff under my supervision utilizing intravenous Versed  and fentanyl . The nurse had no other duties other than monitoring the Dylan and providing sedation during the procedure. I was present for the entire procedure. 1% lidocaine  was used to infiltrate the skin at the access site prior to a stab incision. Local anesthesia was then used to infiltrate the region of soft tissue from the skin to the left iliac bone. The needle was then advanced and CT imaging demonstrated the needle tip to be in the cortex of the left iliac bone. The bone was then penetrated and a sample was obtained. After the sample was evaluated, approximately 5 mL of heparinized bone marrow sample was obtained by aspiration. A core sample was then obtained. The core sample appeared sufficient. All needles were then removed from the Dylan. Sterile dressing was applied. IMPRESSION: Satisfactory core needle biopsy and aspiration of the left iliac bone marrow under CT guidance. Electronically Signed   By: Susan Ensign   On: 03/26/2024 13:58   CT Angio Chest PE W/Cm &/Or Wo Cm Result Date: 03/21/2024 CLINICAL DATA:  Evaluate for acute pulmonary embolism. EXAM: CT ANGIOGRAPHY CHEST  WITH CONTRAST TECHNIQUE: Multidetector CT imaging of the chest was performed using the standard protocol during bolus administration of intravenous contrast. Multiplanar CT image reconstructions and MIPs were obtained to evaluate the vascular anatomy. RADIATION DOSE REDUCTION: This exam was performed according to the departmental dose-optimization program which includes automated exposure control, adjustment of the mA and/or kV according to Dylan size and/or use of iterative reconstruction technique. CONTRAST:  75mL OMNIPAQUE  IOHEXOL  350 MG/ML SOLN COMPARISON:  06/11/2023 FINDINGS: Cardiovascular: Satisfactory opacification of the pulmonary arteries to the segmental level. No evidence of pulmonary embolism. Normal heart size. No pericardial effusion. Aortic atherosclerosis. The ascending thoracic aorta measures 4 cm, image 43/7. Coronary artery calcifications. Mediastinum/Nodes: Thyroid  gland, trachea, and esophagus appear normal. No mediastinal adenopathy. Right hilar node measures 1.5 cm, image 61/4. Lungs/Pleura: Emphysema with diffuse bronchial wall thickening. Within the anterior right upper lobe there is a patchy area of ground-glass and airspace density, image 54/5. Right upper lobe lung nodule measures 5 mm, image 38/5. Unchanged from the previous exam. This is been stable since 12/18/2022 compatible with a benign nodule. Peripheral nodule within the right upper lobe measuring 2 mm is also unchanged from 12/28/2022. 4 mm nodule in the right lower lobe appears new, image 78/5. Upper Abdomen: No acute abnormality.  Cholecystectomy. Musculoskeletal: Thoracic degenerative disc disease. No acute or suspicious osseous findings. Review of the MIP images confirms the above findings. IMPRESSION: 1. No evidence for acute pulmonary embolism. 2. Patchy area of ground-glass and airspace density within the anterior right upper lobe is favored to represent an area of pneumonia. Recommend follow-up imaging to ensure  resolution and to rule out underlying malignancy. 3. Right hilar lymphadenopathy is favored to be reactive. 4. New 4 mm nodule in the  right lower lobe. If the Dylan is at high risk for bronchogenic carcinoma, follow-up chest CT at 1year is recommended. If the Dylan is at low risk, no follow-up is needed. This recommendation follows the consensus statement: Guidelines for Management of Small Pulmonary Nodules Detected on CT Scans: A Statement from the Fleischner Society as published in Radiology 2005; 237:395-400. 5. Coronary artery calcifications. 6. 4 cm ascending thoracic aorta. Recommend annual imaging followup by CTA or MRA. This recommendation follows 2010 ACCF/AHA/AATS/ACR/ASA/SCA/SCAI/SIR/STS/SVM Guidelines for the Diagnosis and Management of Patients with Thoracic Aortic Disease. Circulation. 2010; 121: W098-J191. Aortic aneurysm NOS (ICD10-I71.9) 7. Aortic Atherosclerosis (ICD10-I70.0) and Emphysema (ICD10-J43.9). Electronically Signed   By: Kimberley Penman M.D.   On: 03/21/2024 06:44   DG Chest Portable 1 View Result Date: 03/21/2024 CLINICAL DATA:  Shortness of breath and hypoxia.  Chest pain. EXAM: PORTABLE CHEST 1 VIEW COMPARISON:  06/11/2023. FINDINGS: The heart size and mediastinal contours are within normal limits. Faint right upper lobe opacity is noted concerning for early pneumonia. Left lung appears clear. The visualized osseous structures are intact. The visualized skeletal structures are unremarkable. IMPRESSION: Faint right upper lobe opacity concerning for early pneumonia. Electronically Signed   By: Kimberley Penman M.D.   On: 03/21/2024 06:34    Microbiology: Results for orders placed or performed during the hospital encounter of 04/10/24  Resp panel by RT-PCR (RSV, Flu A&B, Covid) Anterior Nasal Swab     Status: None   Collection Time: 04/10/24  9:35 AM   Specimen: Anterior Nasal Swab  Result Value Ref Range Status   SARS Coronavirus 2 by RT PCR NEGATIVE NEGATIVE Final     Comment: (NOTE) SARS-CoV-2 target nucleic acids are NOT DETECTED.  The SARS-CoV-2 RNA is generally detectable in upper respiratory specimens during the acute phase of infection. The lowest concentration of SARS-CoV-2 viral copies this assay can detect is 138 copies/mL. A negative result does not preclude SARS-Cov-2 infection and should not be used as the sole basis for treatment or other Dylan management decisions. A negative result may occur with  improper specimen collection/handling, submission of specimen other than nasopharyngeal swab, presence of viral mutation(s) within the areas targeted by this assay, and inadequate number of viral copies(<138 copies/mL). A negative result must be combined with clinical observations, Dylan history, and epidemiological information. The expected result is Negative.  Fact Sheet for Patients:  BloggerCourse.com  Fact Sheet for Healthcare Providers:  SeriousBroker.it  This test is no t yet approved or cleared by the [place]  FDA and  has been authorized for detection and/or diagnosis of SARS-CoV-2 by FDA under an Emergency Use Authorization (EUA). This EUA will remain  in effect (meaning this test can be used) for the duration of the COVID-19 declaration under Section 564(b)(1) of the Act, 21 U.S.C.section 360bbb-3(b)(1), unless the authorization is terminated  or revoked sooner.       Influenza A by PCR NEGATIVE NEGATIVE Final   Influenza B by PCR NEGATIVE NEGATIVE Final    Comment: (NOTE) The Xpert Xpress SARS-CoV-2/FLU/RSV plus assay is intended as an aid in the diagnosis of influenza from Nasopharyngeal swab specimens and should not be used as a sole basis for treatment. Nasal washings and aspirates are unacceptable for Xpert Xpress SARS-CoV-2/FLU/RSV testing.  Fact Sheet for Patients: BloggerCourse.com  Fact Sheet for Healthcare  Providers: SeriousBroker.it  This test is not yet approved or cleared by the [place]  FDA and has been authorized for detection and/or diagnosis of SARS-CoV-2 by FDA under an  Emergency Use Authorization (EUA). This EUA will remain in effect (meaning this test can be used) for the duration of the COVID-19 declaration under Section 564(b)(1) of the Act, 21 U.S.C. section 360bbb-3(b)(1), unless the authorization is terminated or revoked.     Resp Syncytial Virus by PCR NEGATIVE NEGATIVE Final    Comment: (NOTE) Fact Sheet for Patients: BloggerCourse.com  Fact Sheet for Healthcare Providers: SeriousBroker.it  This test is not yet approved or cleared by the [place]  FDA and has been authorized for detection and/or diagnosis of SARS-CoV-2 by FDA under an Emergency Use Authorization (EUA). This EUA will remain in effect (meaning this test can be used) for the duration of the COVID-19 declaration under Section 564(b)(1) of the Act, 21 U.S.C. section 360bbb-3(b)(1), unless the authorization is terminated or revoked.  Performed at [facility], [address] [place], [place] [address]     Labs: CBC: Recent Labs  Lab 04/08/24 [phone] 04/10/24 0823 04/11/24 0440  WBC 66.1* 116.7* 105.0*  NEUTROABS 6.6 11.7*  --   HGB 9.2* 8.9* 8.1*  HCT 30.2* 27.9* 26.1*  MCV 100.7* 97.9 99.2  PLT 136* 123* 97*   Basic Metabolic Panel: Recent Labs  Lab 04/08/24 0853 04/10/24 0823 04/10/24 1232 04/11/24 0440  NA 136 135  --  137  K 3.7 3.4*  --  2.9*  CL 100 99  --  105  CO2 24 24  --  21*  GLUCOSE 139* 108*  --  91  BUN 24* 20  --  18  CREATININE 1.50* 1.44*  --  1.36*  CALCIUM 8.6* 8.8*  --  7.9*  MG  --   --  1.5* 2.3   Liver Function Tests: Recent Labs  Lab 04/08/24 0853 04/10/24 0823  AST 35 37  ALT 24 22  ALKPHOS 59 51  BILITOT 0.5 0.7  PROT 8.3* 8.1  ALBUMIN 3.3* 3.1*    CBG: No results for input(s): "GLUCAP" in the last 168 hours.  Discharge time spent: greater than 30 minutes.  This record has been created using Conservation officer, historic buildings. Errors have been sought and corrected,but may not always be located. Such creation errors do not reflect on the standard of care.   Signed: Luna Salinas, MD [facility] 04/11/2024

## 2024-04-11 NOTE — Consult Note (Signed)
 Consultation Note Date: 04/11/2024 at 1050  Patient Name: Dylan Weber  DOB: 02-01-1933  MRN: [id number]  Age / Sex: 89 y.o., male  PCP: Will Hare, NP Referring Physician: Luna Salinas, MD  HPI/Patient Profile: 89 y.o. 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 He presented to ED from home with complaining of several days of right-sided chest pain 04/10/2024. He was previously admitted 5/10-5/15/25 for RUL PNA.   ED workup showed Na+ 135, K+ 3.4, BUN 20, creatinine 1.44, GFR 47, glucose 108, WBC 116.7, Hgb 8.9, platelets 123, BNP 68 and troponin 71. BP 123/66, HR 99, RR 20, 98.9 and 88-90% on baseline 2L O2.  CXR showed resolution of previously observed RUL zone airspace infiltrate, underlying suggestive changes of emphysema and low lung volumes.  CTA reflected same as CXR, negative for PE. Covid/Flu/RSV negative.  He was admitted to [facility] for management of acute AML flare, chest pain, new onset A-fib and elevated troponin.    Latest Ref Rng & Units 04/11/2024    4:40 AM 04/10/2024    8:23 AM 04/08/2024    8:53 AM  CBC  WBC 4.0 - 10.5 K/uL 105.0  116.7  66.1   Hemoglobin 13.0 - 17.0 g/dL 8.1  8.9  9.2   Hematocrit 39.0 - 52.0 % 26.1  27.9  30.2   Platelets 150 - 400 K/uL 97  123  136        Latest Ref Rng & Units 04/11/2024    4:40 AM 04/10/2024    8:23 AM 04/08/2024    8:53 AM  CMP  Glucose 70 - 99 mg/dL 91  409  811   BUN 8 - 23 mg/dL 18  20  24    Creatinine 0.61 - 1.24 mg/dL 9.14  7.82  9.56   Sodium 135 - 145 mmol/L 137  135  136   Potassium 3.5 - 5.1 mmol/L 2.9  3.4  3.7   Chloride 98 - 111 mmol/L 105  99  100   CO2 22 - 32 mmol/L 21  24  24    Calcium 8.9 - 10.3 mg/dL 7.9  8.8  8.6   Total Protein 6.5 - 8.1 g/dL  8.1  8.3   Total Bilirubin 0.0 - 1.2  mg/dL  0.7  0.5   Alkaline Phos 38 - 126 U/L  51  59   AST 15 - 41 U/L  37  35   ALT 0 - 44 U/L  22  24      PMT consulted for goals of care discussion.    Clinical Assessment and Goals of Care: Extensive chart review completed prior to meeting patient including labs, vital signs, imaging, progress notes, orders, and available advanced directive documents from current and previous encounters. I then met with patient and wife to discuss diagnosis prognosis, GOC, EOL wishes, disposition and options.   I introduced Palliative Medicine as specialized  medical care for people living with serious illness. It focuses on providing relief from the symptoms and stress of a serious illness. The goal is to improve quality of life for both the patient and the family.  Ill-appearing, elderly male resting in bed. He is sleepy, but awakes to verbal stimuli. He is alert to self, time, place and situation. He is in no distress. Currently on 8L O2 via [place]. He endorses back pain. Denies chest pain or shortness of breath.   Patient lives with his wife Carmelina Chinchilla. Myrtie Atkinson, son, lives in [place] . As far as functional and nutritional status,Cindy shares that patient was recently riding his stationary bike and performing all of his ADLs independently to include driving. After return home from his previous admission for pneumonia, patient was using a walker and wheelchair but was able to regain his strength. Carmelina Chinchilla shares once he started to have this most recent chest/back pain, he has become more weak and needed assistance with dressing and other ADLs.  Carmelina Chinchilla endorses significant decrease in appetite over the past month.   We discussed patient's current illness and what it means in the larger context of patient's on-going co-morbidities.  Natural disease trajectory and expectations at EOL were discussed.  I attempted to elicit values and goals of care important to the patient. The patient expresses that he wants  treatment for AML and get better. Patient and wife were given scenario to consider that if Mr. Alcala were to become too ill from this acute chest/AML flare, he may no longer be a candidate for treatment. Mr. Kitagawa shares that he never considered not being able to have treatment.  Advance directives, concepts specific to code status and rehospitalization were considered and discussed. Mr. Beyene wants to have CPR and would want to be placed on a ventilator for a short period of time. Carmelina Chinchilla has living will documents that she will bring from home outlining Mr. Groesbeck wishes for medical treatment. He remains a FULL CODE at this time.  Education offered regarding concept specific to human mortality and the limitations of medical interventions to prolong life when the body begins to fail to thrive.  Family is facing treatment option decisions, advanced directive, and anticipatory care needs. Mr. Elison wants Carmelina Chinchilla, wife, to be his surrogate decision maker in the event he becomes unable to speak for himself.    Discussed with patient/family the importance of continued conversation with family and the medical providers regarding overall plan of care and treatment options, ensuring decisions are within the context of the patient's values and GOCs.    Questions and concerns were addressed. The family was encouraged to call with questions or concerns.   Primary Decision Maker PATIENT  Physical Exam Vitals reviewed.  Constitutional:      General: He is not in acute distress.    Appearance: He is ill-appearing.  HENT:     Head: Normocephalic and atraumatic.     Mouth/Throat:     Mouth: Mucous membranes are dry.  Pulmonary:     Effort: Pulmonary effort is normal. No respiratory distress.  Musculoskeletal:     Right lower leg: No edema.     Left lower leg: No edema.  Skin:    General: Skin is warm and dry.  Neurological:     Mental Status: He is alert and oriented to person, place, and time.   Psychiatric:        Mood and Affect: Mood normal.        Behavior:  Behavior normal.        Thought Content: Thought content normal.        Judgment: Judgment normal.   Recommendations/Plan: FULL CODE status as previously documented    Continue current supportive interventions PMT to follow for ongoing support   Palliative Assessment/Data: 40-50%   Discussed plan of care with Dr. Ariel Begun who is reaching out to other facilities that offer specialized treatment for AML flare.   Thank you for this consult. Palliative medicine will continue to follow and assist holistically.   Time Total: 75 minutes  Time spent includes: Detailed review of medical records (labs, imaging, vital signs), medically appropriate exam (mental status, respiratory, cardiac, skin), discussed with treatment team, counseling and educating patient, family and staff, documenting clinical information, medication management and coordination of care.     Ina Manas, Joyice Nodal [facility] Palliative Medicine Team  04/11/2024 9:13 AM  Office [phone]  Pager [phone]     Please contact Palliative Medicine Team providers via AMION for questions and concerns.

## 2024-04-11 NOTE — Consult Note (Signed)
 PHARMACY - ANTICOAGULATION CONSULT NOTE  Pharmacy Consult for heparin  infusion Indication: chest pain/ACS  Allergies  Allergen Reactions   Pregabalin Anaphylaxis   Diltiazem  Other (See Comments)    Flushing/redness to body   Prednisone  Other (See Comments)    jittery   Meloxicam Nausea Only   Amoxicillin Rash   Sulfamethoxazole-Trimethoprim Rash    Patient Measurements: Height: 5\' 9"  (175.3 cm) Weight: 68 kg (150 lb) IBW/kg (Calculated) : 70.7 HEPARIN  DW (KG): 68  Vital Signs: Temp: 97.8 F (36.6 C) (05/31 0727) Temp Source: Oral (05/31 0727) BP: 110/62 (05/31 0727) Pulse Rate: 65 (05/31 0727)  Labs: Recent Labs    04/10/24 0823 04/10/24 1232 04/10/24 1753 04/10/24 2143 04/11/24 0440  HGB 8.9*  --   --   --  8.1*  HCT 27.9*  --   --   --  26.1*  PLT 123*  --   --   --  97*  APTT 45*  --   --   --   --   LABPROT 17.8*  --   --   --   --   INR 1.5*  --   --   --   --   HEPARINUNFRC  --   --   --  <0.10*  --   CREATININE 1.44*  --   --   --  1.36*  TROPONINIHS 71* 189* 223*  --  236*    Estimated Creatinine Clearance: 36.1 mL/min (A) (by C-G formula based on SCr of 1.36 mg/dL (H)).   Medical History: Past Medical History:  Diagnosis Date   Basal cell carcinoma 06/19/2022   Dorsum nose. Nodular. EDC 07/17/2022   BCC (basal cell carcinoma) 10/11/2022   right nasal supratip, superficial and nodular, EDC 12/19/22   COPD (chronic obstructive pulmonary disease) (HCC)    Coronary artery calcification seen on CT scan 11/17/2021   COVID-19 10/2021   Diastolic dysfunction    a. 11/2021 Echo: EF 55-60%, no rwma, GrI DD, nl RV fxn. Mild AI.   GERD (gastroesophageal reflux disease)    Pulmonary embolism (HCC)    a. 11/2021 CTA Chest: small peripheral PE in RLL branch of PA-->Eliquis .   Right leg DVT (HCC)    a. 11/2021 LE U/S: occlusive R calf vein DVT in R peroneal vein-->eliquis .   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 [facility] 07/17/2022   SVT (supraventricular tachycardia) (HCC)    a. Dx 10/2021 in setting of COVID infxn.    Medications:  No home anticoagulants per pharmacist review.  Assessment: 89 yo male presented to the ED with chest pain.  PMH includes COPD, Hx of PE, AML, and CAD.  Patient found to have elevated troponin.  Pharmacy consulted to initiate heparin  infusion.  Baseline aPTT and PT/INR ordered  Goal of Therapy:  Heparin  level 0.3-0.7 units/ml Monitor platelets by anticoagulation protocol: Yes  05/30 2143 HL < 0.1, subtherapeutic 05/31 0730 HL < 0.1, subtherapeutic(confirmed with nurse, infusion continuously running)   Plan:  Bolus 2000 units x 1 Increase heparin  infusion rate to 1200 units/hr Recheck HL in 8 hrs after rate change CBC daily while on heparin   Collyn Ribas A Margaret Staggs, PharmD Clinical Pharmacist 04/12/2024 7:26 AM

## 2024-04-11 NOTE — Progress Notes (Signed)
Patient refused vital at this time.

## 2024-04-11 NOTE — Plan of Care (Signed)
  Problem: Education: Goal: Knowledge of General Education information will improve Description: Including pain rating scale, medication(s)/side effects and non-pharmacologic comfort measures Outcome: Progressing   Problem: Clinical Measurements: Goal: Ability to maintain clinical measurements within normal limits will improve Outcome: Progressing Goal: Diagnostic test results will improve Outcome: Progressing Goal: Respiratory complications will improve Outcome: Progressing Goal: Cardiovascular complication will be avoided Outcome: Progressing   Problem: Nutrition: Goal: Adequate nutrition will be maintained Outcome: Progressing   Problem: Coping: Goal: Level of anxiety will decrease Outcome: Progressing   Problem: Pain Managment: Goal: General experience of comfort will improve and/or be controlled Outcome: Progressing   Problem: Safety: Goal: Ability to remain free from injury will improve Outcome: Progressing

## 2024-04-11 NOTE — Consult Note (Addendum)
 PHARMACY - ANTICOAGULATION CONSULT NOTE  Pharmacy Consult for heparin  infusion Indication: chest pain/ACS  Allergies  Allergen Reactions   Pregabalin Anaphylaxis   Diltiazem  Other (See Comments)    Flushing/redness to body   Prednisone  Other (See Comments)    jittery   Meloxicam Nausea Only   Amoxicillin Rash   Sulfamethoxazole-Trimethoprim Rash    Patient Measurements: Height: 5\' 9"  (175.3 cm) Weight: 68 kg (150 lb) IBW/kg (Calculated) : 70.7 HEPARIN  DW (KG): 68  Vital Signs: Temp: 98 F (36.7 C) (05/31 1227) Temp Source: Oral (05/31 0727) BP: 129/48 (05/31 1227) Pulse Rate: 61 (05/31 1227)  Labs: Recent Labs    04/10/24 0823 04/10/24 1232 04/10/24 1753 04/10/24 2143 04/11/24 0440 04/11/24 0730 04/11/24 1615  HGB 8.9*  --   --   --  8.1*  --   --   HCT 27.9*  --   --   --  26.1*  --   --   PLT 123*  --   --   --  97*  --   --   APTT 45*  --   --   --   --   --   --   LABPROT 17.8*  --   --   --   --   --   --   INR 1.5*  --   --   --   --   --   --   HEPARINUNFRC  --   --   --  <0.10*  --  <0.10* 0.10*  CREATININE 1.44*  --   --   --  1.36*  --   --   TROPONINIHS 71* 189* 223*  --  236*  --   --     Estimated Creatinine Clearance: 36.1 mL/min (A) (by C-G formula based on SCr of 1.36 mg/dL (H)).   Medical History: Past Medical History:  Diagnosis Date   Basal cell carcinoma 06/19/2022   Dorsum nose. Nodular. EDC 07/17/2022   BCC (basal cell carcinoma) 10/11/2022   right nasal supratip, superficial and nodular, EDC 12/19/22   COPD (chronic obstructive pulmonary disease) (HCC)    Coronary artery calcification seen on CT scan 11/17/2021   COVID-19 10/2021   Diastolic dysfunction    a. 11/2021 Echo: EF 55-60%, no rwma, GrI DD, nl RV fxn. Mild AI.   GERD (gastroesophageal reflux disease)    Pulmonary embolism (HCC)    a. 11/2021 CTA Chest: small peripheral PE in RLL branch of PA-->Eliquis .   Right leg DVT (HCC)    a. 11/2021 LE U/S: occlusive R calf vein DVT  in R peroneal vein-->eliquis .   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 [facility] 07/17/2022   SVT (supraventricular tachycardia) (HCC)    a. Dx 10/2021 in setting of COVID infxn.    Medications:  No home anticoagulants per pharmacist review.  Assessment: 89 yo male presented to the ED with chest pain.  PMH includes COPD, Hx of PE, AML, and CAD.  Patient found to have elevated troponin.  Pharmacy consulted to initiate heparin  infusion.  Baseline Hgb 8.9, PLT 123, INR 1.5, aPTT 45  Goal of Therapy:  Heparin  level 0.3-0.7 units/ml Monitor platelets by anticoagulation protocol: Yes  05/30 2143 HL < 0.1, subtherapeutic 05/31 0730 HL < 0.1, subtherapeutic(confirmed with nurse, infusion continuously running) 05/31 1615 HL 0.10, subtherapeutic   Plan:  Bolus 2000 units  x 1 Increase heparin  infusion rate to 1500 units/hr Recheck HL in 8 hrs after rate change CBC daily while on heparin   Will M. Alva Jewels, PharmD Clinical Pharmacist 04/11/2024 5:31 PM

## 2024-04-11 NOTE — Progress Notes (Signed)
 This RN called Lexi, RN at [facility] and provided full report on pt. All questions and concerns answered at this time. This RN communicated that transport time to [facility] would tentatively be 2000. Nightshift RN, Fabio Holts, notified and provided receiving RN's phone number to call if updates occur. Pt's spouse updated of plan of care during shift by this RN.

## 2024-04-11 NOTE — Progress Notes (Signed)
  Progress Note  Patient Name: Dylan Weber Date of Encounter: 04/11/2024 [facility] Cardiologist: Belva Boyden, MD   Interval Summary    Patient reports no further chest pain, he says he is having some back pain. Still trouble breathing in.   WBC down to 105. K 2.9- will order supplemental K Echo showed normal LV function  Vital Signs Vitals:   04/11/24 0007 04/11/24 0422 04/11/24 0621 04/11/24 0727  BP: (!) 91/55  (!) 103/55 110/62  Pulse: 61  60 65  Resp: 18 17 17 17   Temp: 98 F (36.7 C)  97.8 F (36.6 C) 97.8 F (36.6 C)  TempSrc:   Axillary Oral  SpO2: 100%  99% 100%  Weight:      Height:        Intake/Output Summary (Last 24 hours) at 04/11/2024 0747 Last data filed at 04/11/2024 0630 Gross per 24 hour  Intake 2969.74 ml  Output 550 ml  Net 2419.74 ml      04/10/2024    8:21 AM 04/08/2024    9:00 AM 04/01/2024    2:41 PM  Last 3 Weights  Weight (lbs) 150 lb 155 lb 156 lb  Weight (kg) 68.04 kg 70.308 kg 70.761 kg      Telemetry/ECG  NSR HR 50-56 - Personally Reviewed  Physical Exam  GEN: No acute distress.   Neck: No JVD Cardiac: RRR, no murmurs, rubs, or gallops.  Respiratory: Clear to auscultation bilaterally. GI: Soft, nontender, non-distended  MS: No edema  Assessment & Plan   Chest pain/SOB Elevated troponin NSTEMI - very atypical chest pain. Sudden sharp pain in the center of the chest that lasts for a few seconds. Worse with movement - Chest CT showed no PE, suspected infectious infiltrate in the right upper lobe improved, emphysema. - HS trop 161>096>045.  - suspect pleuritic chest pain and started on colchicine  0.6mg  BID, ibuprofen  600mg  Q6H and PPI. Continue for 3 months - continue IV heparin  x 48 hours - echo showed normal LV function - Not a good cath candidate given AML, anemia and thrombocytopenia - will check inflammatory markers   Possible New onset Afib - noted to possibly have Afib HR 101 in the setting  of suspected acute AML flare s/p IVF - initial EKG appears to be irregular, but does have p waves. - tele appears to show NSR, HR in the 80-90s - no evidence of SVT noted -continue PTA bisoprolol    AML Leukocytosis - WBC up to 66.1>116.7 2/2 AML - plan to initiate treatment June 2 per oncology   Thrombocytopenia Anemia - Hgb 8.9. Hgb 10.9 2 weeks ago - plt 123 - suspected 2/2 Hydrea     For questions or updates, please contact [facility] Please consult [email or web address] for contact info under       Signed, Keifer Habib Rebekah Canada, PA-C

## 2024-04-12 LAB — BLOOD GAS, VENOUS
Acid-Base Excess: 0.5 mmol/L (ref 0.0–2.0)
Bicarbonate: 26 mmol/L (ref 20.0–28.0)
O2 Saturation: 32 %
Patient temperature: 37
pCO2, Ven: 44 mmHg (ref 44–60)
pH, Ven: 7.38 (ref 7.25–7.43)

## 2024-04-12 NOTE — Progress Notes (Signed)
 Patient was pickup by transport to [facility]. Tori Freer RN of [facility] updated. Keng Jewel spouse informed.

## 2024-04-13 ENCOUNTER — Inpatient Hospital Stay: Admitting: Pharmacist

## 2024-04-13 ENCOUNTER — Other Ambulatory Visit: Payer: Self-pay | Admitting: Oncology

## 2024-04-13 ENCOUNTER — Ambulatory Visit

## 2024-04-13 ENCOUNTER — Inpatient Hospital Stay

## 2024-04-13 ENCOUNTER — Other Ambulatory Visit

## 2024-04-13 ENCOUNTER — Ambulatory Visit: Admitting: Oncology

## 2024-04-13 ENCOUNTER — Inpatient Hospital Stay: Admitting: Oncology

## 2024-04-13 ENCOUNTER — Ambulatory Visit: Admitting: Pharmacist

## 2024-04-14 ENCOUNTER — Inpatient Hospital Stay

## 2024-04-15 ENCOUNTER — Inpatient Hospital Stay

## 2024-04-16 ENCOUNTER — Inpatient Hospital Stay

## 2024-04-17 ENCOUNTER — Other Ambulatory Visit: Payer: Self-pay | Admitting: Internal Medicine

## 2024-04-17 ENCOUNTER — Inpatient Hospital Stay

## 2024-04-20 ENCOUNTER — Other Ambulatory Visit: Payer: Self-pay | Admitting: Pharmacy Technician

## 2024-04-20 ENCOUNTER — Encounter: Admitting: Dermatology

## 2024-04-20 LAB — INTELLIGEN MYELOID

## 2024-04-20 NOTE — Progress Notes (Signed)
 Oral Oncology Patient Advocate Encounter  Disenrolling patient for time being. Patient is currently hospitalized. Will re-enroll when/if appropriate.  Dylan Weber (Patty) Benjaman Branch, CPhT  [facility], [place], Cristine Done, [place] Oral Chemotherapy Patient Advocate Phone: [phone]  Fax: [phone]

## 2024-04-23 ENCOUNTER — Other Ambulatory Visit: Payer: Self-pay | Admitting: Oncology

## 2024-05-11 ENCOUNTER — Other Ambulatory Visit

## 2024-05-11 ENCOUNTER — Ambulatory Visit: Admitting: Oncology

## 2024-05-11 ENCOUNTER — Ambulatory Visit

## 2024-05-12 ENCOUNTER — Ambulatory Visit

## 2024-05-12 DEATH — deceased

## 2024-05-13 ENCOUNTER — Ambulatory Visit

## 2024-05-14 ENCOUNTER — Ambulatory Visit

## 2024-05-18 ENCOUNTER — Ambulatory Visit
